# Patient Record
Sex: Female | Born: 1975 | Race: White | Hispanic: No | Marital: Married | Smoking: Former smoker
Health system: Southern US, Community
[De-identification: ages and names within clinical notes are randomized; demographics above are authoritative.]

## PROBLEM LIST (undated history)

## (undated) ENCOUNTER — Inpatient Hospital Stay (HOSPITAL_COMMUNITY): Payer: Self-pay

## (undated) DIAGNOSIS — D1803 Hemangioma of intra-abdominal structures: Secondary | ICD-10-CM

## (undated) DIAGNOSIS — Z8619 Personal history of other infectious and parasitic diseases: Secondary | ICD-10-CM

## (undated) DIAGNOSIS — E282 Polycystic ovarian syndrome: Secondary | ICD-10-CM

## (undated) DIAGNOSIS — N809 Endometriosis, unspecified: Secondary | ICD-10-CM

## (undated) DIAGNOSIS — T7840XA Allergy, unspecified, initial encounter: Secondary | ICD-10-CM

## (undated) DIAGNOSIS — R569 Unspecified convulsions: Secondary | ICD-10-CM

## (undated) DIAGNOSIS — Z8669 Personal history of other diseases of the nervous system and sense organs: Secondary | ICD-10-CM

## (undated) DIAGNOSIS — R000 Tachycardia, unspecified: Secondary | ICD-10-CM

## (undated) DIAGNOSIS — Z87442 Personal history of urinary calculi: Secondary | ICD-10-CM

## (undated) DIAGNOSIS — R112 Nausea with vomiting, unspecified: Secondary | ICD-10-CM

## (undated) DIAGNOSIS — E785 Hyperlipidemia, unspecified: Secondary | ICD-10-CM

## (undated) DIAGNOSIS — G43909 Migraine, unspecified, not intractable, without status migrainosus: Secondary | ICD-10-CM

## (undated) DIAGNOSIS — F329 Major depressive disorder, single episode, unspecified: Secondary | ICD-10-CM

## (undated) DIAGNOSIS — G4730 Sleep apnea, unspecified: Secondary | ICD-10-CM

## (undated) DIAGNOSIS — R632 Polyphagia: Secondary | ICD-10-CM

## (undated) DIAGNOSIS — Z9889 Other specified postprocedural states: Secondary | ICD-10-CM

## (undated) DIAGNOSIS — Z87440 Personal history of urinary (tract) infections: Secondary | ICD-10-CM

## (undated) DIAGNOSIS — H9325 Central auditory processing disorder: Secondary | ICD-10-CM

## (undated) DIAGNOSIS — I10 Essential (primary) hypertension: Secondary | ICD-10-CM

## (undated) DIAGNOSIS — I4711 Inappropriate sinus tachycardia, so stated: Secondary | ICD-10-CM

## (undated) DIAGNOSIS — F32A Depression, unspecified: Secondary | ICD-10-CM

## (undated) DIAGNOSIS — K219 Gastro-esophageal reflux disease without esophagitis: Secondary | ICD-10-CM

## (undated) HISTORY — DX: Essential (primary) hypertension: I10

## (undated) HISTORY — DX: Gastro-esophageal reflux disease without esophagitis: K21.9

## (undated) HISTORY — DX: Personal history of other infectious and parasitic diseases: Z86.19

## (undated) HISTORY — DX: Major depressive disorder, single episode, unspecified: F32.9

## (undated) HISTORY — DX: Personal history of urinary calculi: Z87.442

## (undated) HISTORY — DX: Tachycardia, unspecified: R00.0

## (undated) HISTORY — PX: WISDOM TOOTH EXTRACTION: SHX21

## (undated) HISTORY — DX: Allergy, unspecified, initial encounter: T78.40XA

## (undated) HISTORY — DX: Endometriosis, unspecified: N80.9

## (undated) HISTORY — PX: OTHER SURGICAL HISTORY: SHX169

## (undated) HISTORY — DX: Hyperlipidemia, unspecified: E78.5

## (undated) HISTORY — DX: Polyphagia: R63.2

## (undated) HISTORY — DX: Migraine, unspecified, not intractable, without status migrainosus: G43.909

## (undated) HISTORY — DX: Depression, unspecified: F32.A

## (undated) HISTORY — DX: Inappropriate sinus tachycardia, so stated: I47.11

## (undated) HISTORY — DX: Personal history of other diseases of the nervous system and sense organs: Z86.69

## (undated) HISTORY — DX: Personal history of urinary (tract) infections: Z87.440

---

## 2001-03-14 HISTORY — PX: PILONIDAL CYST EXCISION: SHX744

## 2004-03-14 HISTORY — PX: CHOLECYSTECTOMY: SHX55

## 2005-10-07 ENCOUNTER — Encounter: Admission: RE | Admit: 2005-10-07 | Discharge: 2005-10-07 | Payer: Self-pay | Admitting: Internal Medicine

## 2005-10-20 ENCOUNTER — Ambulatory Visit (HOSPITAL_COMMUNITY): Admission: RE | Admit: 2005-10-20 | Discharge: 2005-10-21 | Payer: Self-pay | Admitting: General Surgery

## 2005-10-21 ENCOUNTER — Encounter (INDEPENDENT_AMBULATORY_CARE_PROVIDER_SITE_OTHER): Payer: Self-pay | Admitting: Specialist

## 2007-03-01 ENCOUNTER — Ambulatory Visit (HOSPITAL_COMMUNITY): Admission: RE | Admit: 2007-03-01 | Discharge: 2007-03-01 | Payer: Self-pay | Admitting: Obstetrics & Gynecology

## 2007-10-11 ENCOUNTER — Encounter: Admission: RE | Admit: 2007-10-11 | Discharge: 2007-12-04 | Payer: Self-pay | Admitting: Obstetrics & Gynecology

## 2008-06-24 ENCOUNTER — Ambulatory Visit: Payer: Self-pay | Admitting: Obstetrics & Gynecology

## 2008-07-15 ENCOUNTER — Encounter: Admission: RE | Admit: 2008-07-15 | Discharge: 2008-09-03 | Payer: Self-pay | Admitting: Obstetrics & Gynecology

## 2008-10-26 ENCOUNTER — Emergency Department (HOSPITAL_COMMUNITY): Admission: EM | Admit: 2008-10-26 | Discharge: 2008-10-26 | Payer: Self-pay | Admitting: Emergency Medicine

## 2010-06-20 LAB — URINALYSIS, ROUTINE W REFLEX MICROSCOPIC
Bilirubin Urine: NEGATIVE
Glucose, UA: NEGATIVE mg/dL
Ketones, ur: NEGATIVE mg/dL
Nitrite: POSITIVE — AB
Protein, ur: NEGATIVE mg/dL

## 2010-06-20 LAB — URINE CULTURE: Colony Count: >=100000

## 2010-06-20 LAB — URINE MICROSCOPIC-ADD ON

## 2010-06-30 ENCOUNTER — Ambulatory Visit (HOSPITAL_BASED_OUTPATIENT_CLINIC_OR_DEPARTMENT_OTHER): Payer: Medicaid Other | Attending: Family Medicine

## 2010-06-30 DIAGNOSIS — G4710 Hypersomnia, unspecified: Secondary | ICD-10-CM | POA: Insufficient documentation

## 2010-06-30 DIAGNOSIS — G4730 Sleep apnea, unspecified: Secondary | ICD-10-CM | POA: Insufficient documentation

## 2010-06-30 DIAGNOSIS — I491 Atrial premature depolarization: Secondary | ICD-10-CM | POA: Insufficient documentation

## 2010-07-03 DIAGNOSIS — G4730 Sleep apnea, unspecified: Secondary | ICD-10-CM

## 2010-07-03 DIAGNOSIS — I491 Atrial premature depolarization: Secondary | ICD-10-CM

## 2010-07-03 DIAGNOSIS — G4710 Hypersomnia, unspecified: Secondary | ICD-10-CM

## 2010-07-03 NOTE — Procedures (Signed)
NAMEALDINE, CHAKRABORTY                ACCOUNT NO.:  [id number]  MEDICAL RECORD NO.:  [id number]         PATIENT TYPE:  OUT  LOCATION:  SLEEP CENTER                 FACILITY:  [facility]  PHYSICIAN:  Clinton D. Maple Hudson, MD, FCCP, FACPDATE OF BIRTH:  26-[birth year]-1976  DATE OF STUDY:  06/30/2010                           NOCTURNAL POLYSOMNOGRAM  REFERRING PHYSICIAN:  DANA I ZANONE  INDICATION FOR STUDY:  Hypersomnia with sleep apnea.  EPWORTH SLEEPINESS SCORE:  9/24.  BMI 37.1.  Weight 190 pounds.  Height 68 inches.  Neck 14.5 inches.  MEDICATIONS:  Home medications are charted and reviewed.  SLEEP ARCHITECTURE:  Total sleep time 255.5 minutes with sleep efficiency 70.9%.  Stage I was 5.9%, stage II 82.6%, stage III 11.5%, REM absent.  Sleep latency 66.5 minutes, REM latency NA.  Awake after sleep onset 20 minutes, arousal index 13.6.  BEDTIME MEDICATION:  None.  RESPIRATORY DATA:  Apnea-hypopnea index (AHI) 0 per hour.  No significant respiratory events were scored.  She did not qualify for application of CPAP titration by split protocol.  OXYGEN DATA:  Moderately loud snoring with oxygen desaturation to a nadir of 93% and the mean oxygen saturation through the study of 95.1% on room air.  CARDIAC DATA:  Sinus rhythm with occasional PAC.  MOVEMENT-PARASOMNIA:  No significant movement disturbance.  No bathroom trips.  IMPRESSIONS-RECOMMENDATIONS: 1. Sleep architecture was significant for delay in sleep onset until     shortly after midnight, waking then at 5 a.m. to end the study.  No     bedtime medication. 2. No significant respiratory events with sleep disturbance.  AHI 0     per hour.  Moderately loud snoring with oxygen desaturation to a     nadir of 93% and the mean oxygen saturation through the study of     95.1% on room     air. 3. Occasional premature atrial contractions.  On arrival, blood     pressure was 143/97.     Clinton D. Maple Hudson, MD, [facility], FACP Diplomate, Research scientist (medical) of Sleep Medicine Electronically Signed    CDY/MEDQ  D:  07/03/2010 11:18:29  T:  07/03/2010 22:16:29  Job:  [id number]

## 2010-07-05 ENCOUNTER — Encounter (HOSPITAL_BASED_OUTPATIENT_CLINIC_OR_DEPARTMENT_OTHER): Payer: Self-pay

## 2010-07-30 NOTE — Op Note (Signed)
NAMEDahlia Haley             ACCOUNT NO.:  [id number]   MEDICAL RECORD NO.:  [id number]          PATIENT TYPE:  AMB   LOCATION:  SDS                          FACILITY:  [facility]   PHYSICIAN:  Cherylynn Ridges, M.D.    DATE OF BIRTH:  [birth year]   DATE OF PROCEDURE:  10/21/2005  DATE OF DISCHARGE:                                 OPERATIVE REPORT   PREOPERATIVE DIAGNOSIS:  Symptomatic cholelithiasis.   POSTOPERATIVE DIAGNOSIS:  Symptomatic cholelithiasis.   PROCEDURE:  Laparoscopic cholecystectomy with cholangiogram.   SURGEON:  Dr. Lindie Spruce.   ASSISTANT:  Medical student, Dirk Dress.   ANESTHESIA:  General endotracheal.   ESTIMATED BLOOD LOSS:  Less than 20 mL.   COMPLICATIONS:  None.   CONDITION:  Stable   SPECIMEN:  Gallbladder plus stones.   INDICATIONS FOR OPERATION:  The patient is a 35 year old with abdominal pain  and right upper quadrant postprandially.  He now comes in for an elective  laparoscopic cholecystectomy.   FINDINGS:  In addition to gallstones, the patient had a normal cholangiogram  which did reflux into the pancreatic duct.  The patient also had significant  adhesions to the right lobe of the liver posteriorly which we lysed.   OPERATION:  The patient was taken to the operating room, placed on the table  in supine position.  After an adequate endotracheal anesthetic was  administered, she was prepped and draped in the usual sterile manner  exposing the midline in the right upper quadrant.   A supraumbilical curvilinear incision was made using and 11 blade and taken  down to the midline fascia.  It was through this midline fascia that we made  an incision using #15 blade longitudinally, grabbing the edges with a Kocher  clamp x2.  We then bluntly dissected down to the peritoneal cavity, then  passed a pursestring suture of 0 Vicryl, passed on the UR6 needle into the  fascial opening.  We then used Hassan cannula passed through the fascial  opening into the peritoneal cavity and secured in place with the pursestring  suture.  Carbon dioxide gas was then insufflated into the peritoneal cavity  up to a maximal intra-abdominal pressure of 12 mmHg.  The patient was placed  in reversed Trendelenburg, the left-side was tilted down.  Two right costal  margin 5-mm cannulas and a subxiphoid 11/12-mm cannula were passed under  direct vision into the peritoneal cavity.  Once this was done, the  dissection was begun.   The patient was noted to have adhesions of omentum to the right lobe of the  liver which were taken down using electrocautery.  Any tearing of the liver  capsule was controlled with electrocautery.  We then placed a clamp on the  gallbladder dome, retracted towards the anterior abdominal wall and right  upper quadrant.  A second clamp was placed along the infundibulum as we  fanned out the peritoneum overlying the triangle of Calot and hepatic  duodenal triangle.  With this in place, we able to dissect out the cystic  duct and the cystic artery.  Once we had the cystic  duct adequately  dissected out and a window was placed between it and the cystic artery, we  placed an Endoclip along the gallbladder side of the cystic duct, made a  cholecystodochotomy using laparoscopic scissors and then did a cholangiogram  using a Cook catheter which was passed through the anterior abdominal wall  and then passed into the cholecystodochotomy.  Cholangiogram showed a very  small common bile duct and common hepatic duct, good flow into the duodenum,  red reflex into the pancreatic duct and good proximal filling.  Once  cholangiogram was completed, we removed the clipped, removed the  cholangiocatheter, distally clipped the cystic duct x2 and then transected  it.  We then transected the proximally and distally clipped cystic artery  and then dissected out the gallbladder from its bed using electrocautery on  a hook dissector.  All counts  were correct at that point.  Once the  gallbladder was removed, we used an EndoCatch bag to bring it out from the  supraumbilical site.  We then closed off this site using a pursestring  suture which was then placed to hold in the Heath cannula.  We irrigated  with a little bit more than a liter of saline solution.  Once this was done,  we aspirated all fluid and gas from above the liver, removing all cannulas.  The skin was then closed using running subcuticular stitch of 4-0 Vicryl.  Quarter percent Marcaine with epinephrine was used to anesthetize the skin.  All needle, sponge counts and instrument counts were correct.  Sterile  dressing was applied.      Cherylynn Ridges, M.D.  Electronically Signed     JOW/MEDQ  D:  10/21/2005  T:  10/21/2005  Job:  [id number]   cc:   Lilla Shook, M.D.

## 2011-01-27 ENCOUNTER — Other Ambulatory Visit: Payer: Self-pay | Admitting: Sports Medicine

## 2011-01-27 DIAGNOSIS — M25512 Pain in left shoulder: Secondary | ICD-10-CM

## 2011-02-11 ENCOUNTER — Ambulatory Visit
Admission: RE | Admit: 2011-02-11 | Discharge: 2011-02-11 | Disposition: A | Discharge status: Home / Self Care | Payer: PRIVATE HEALTH INSURANCE | Source: Ambulatory Visit | Attending: Sports Medicine | Admitting: Sports Medicine

## 2011-02-11 DIAGNOSIS — M25512 Pain in left shoulder: Secondary | ICD-10-CM

## 2011-02-11 MED ORDER — IOHEXOL 180 MG/ML  SOLN
10.0000 mL | Freq: Once | INTRAMUSCULAR | Status: AC | PRN
  Administered 2011-02-11: 10 mL via INTRA_ARTICULAR

## 2011-07-13 LAB — HM PAP SMEAR: HM Pap smear: NORMAL

## 2011-09-20 ENCOUNTER — Encounter: Payer: Self-pay | Admitting: Family

## 2011-09-20 ENCOUNTER — Ambulatory Visit (INDEPENDENT_AMBULATORY_CARE_PROVIDER_SITE_OTHER): Payer: PRIVATE HEALTH INSURANCE | Admitting: Family

## 2011-09-20 VITALS — BP 118/86 | HR 67 | Temp 98.5°F | Resp 16 | Ht 60.0 in | Wt 184.0 lb

## 2011-09-20 DIAGNOSIS — F418 Other specified anxiety disorders: Secondary | ICD-10-CM | POA: Insufficient documentation

## 2011-09-20 DIAGNOSIS — F341 Dysthymic disorder: Secondary | ICD-10-CM

## 2011-09-20 DIAGNOSIS — G40909 Epilepsy, unspecified, not intractable, without status epilepticus: Secondary | ICD-10-CM

## 2011-09-20 DIAGNOSIS — Z720 Tobacco use: Secondary | ICD-10-CM

## 2011-09-20 DIAGNOSIS — E785 Hyperlipidemia, unspecified: Secondary | ICD-10-CM

## 2011-09-20 DIAGNOSIS — I4711 Inappropriate sinus tachycardia, so stated: Secondary | ICD-10-CM

## 2011-09-20 DIAGNOSIS — N390 Urinary tract infection, site not specified: Secondary | ICD-10-CM

## 2011-09-20 DIAGNOSIS — F4310 Post-traumatic stress disorder, unspecified: Secondary | ICD-10-CM | POA: Insufficient documentation

## 2011-09-20 DIAGNOSIS — E669 Obesity, unspecified: Secondary | ICD-10-CM

## 2011-09-20 DIAGNOSIS — F17200 Nicotine dependence, unspecified, uncomplicated: Secondary | ICD-10-CM

## 2011-09-20 DIAGNOSIS — I498 Other specified cardiac arrhythmias: Secondary | ICD-10-CM

## 2011-09-20 DIAGNOSIS — R000 Tachycardia, unspecified: Secondary | ICD-10-CM

## 2011-09-20 DIAGNOSIS — G43909 Migraine, unspecified, not intractable, without status migrainosus: Secondary | ICD-10-CM | POA: Insufficient documentation

## 2011-09-20 MED ORDER — VARENICLINE TARTRATE 0.5 MG X 11 & 1 MG X 42 PO MISC: ORAL | Status: DC

## 2011-09-20 MED ORDER — VARENICLINE TARTRATE 1 MG PO TABS: 1.0000 mg | ORAL_TABLET | Freq: Two times a day (BID) | ORAL | Status: DC

## 2011-09-20 NOTE — Assessment & Plan Note (Signed)
Plan to check FLP next visit.  

## 2011-09-20 NOTE — Assessment & Plan Note (Signed)
Recommended that she keep calories 1200 to 1500 a day.

## 2011-09-20 NOTE — Assessment & Plan Note (Addendum)
Pt motivated to quit.  Wants to try chantix.  Reviewed side effects of chantix including rare risk of SI.  Pt instructed to discontinue med and go to ED if this occurs. Counseled on smoking cessation x 3-5 minutes.
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
## 2011-09-20 NOTE — Assessment & Plan Note (Signed)
Controlled off of meds.  Follows with neurology.

## 2011-09-20 NOTE — Assessment & Plan Note (Signed)
Currently stable off of meds- managed by neurology.

## 2011-09-20 NOTE — Progress Notes (Signed)
Subjective:    Patient ID: Chelsea Haley, female    DOB: 01-17-1976, 36 y.o.   MRN: [id number]  HPI  [name] "Chelsea Haley" is a 36 yr old female who presents today to establish care. She has several concerns:  Overweight- She reports that she really wants to lose weight.   Tobacco abuse- Wants to quit.    Tachycardia- Diagnosed in 2008- thinks that she needs to see an EP/different cardiologist.  Reports that it sometimes feels like heart is trying to stop/and then start back up.  Reports that this is intermittent (IST).  Reports that it happens when she tries to physically exert herself.  She reports that she had a 30 day monitor.  Has seen Dr. Chales Abrahams in [place]. On metoprolol.  Depression- well controlled on prozac. She has never been hospitalized for depression. PTSD/Depression/Anxiety. She was in group therapy. Sees Dr.  Mady Haagensen at [facility] in [place]  HTN- HCTZ.  She reports that she develops edema in legs and fingers if she does not take hctz.    Seizure disorder- diagnosed (infantile spasms) age 7months.  At age 68 developed febrile seizures. She sees Environmental education officer at [facility] [employer].  She reports hx or recent abnormal EEG.  Hyperlipidemia- not on meds, trying to watch her diet.    Migraines- last migraine was in April.  She develops "spots" only. She does not take medication for this.  Kidney stones- had one episode 1 year ago.  Had stone in each kidney. Sees Dr. Logan Bores- urology, who has frequend uti's and takes macrodantin prophylaxis.       Review of Systems See HPI  Past Medical History  Diagnosis Date  . History of chicken pox   . Depression   . Migraines   . History of kidney stones   . History of seizure disorder     as a child. No medication since age 60.  Marland Kitchen Hyperlipidemia   . Elevated blood pressure reading without diagnosis of hypertension   . Tachycardia   . History of frequent urinary tract infections   . GERD (gastroesophageal reflux disease)   . Binge eating      History   Social History  . Marital Status: Divorced    Spouse Name: N/A    Number of Children: 1  . Years of Education: N/A   Occupational History  .     Social History Main Topics  . Smoking status: Current Everyday Smoker -- 1.0 packs/day for 20 years    Types: Cigarettes  . Smokeless tobacco: Never Used  . Alcohol Use: 0.5 oz/week    1 drink(s) per week  . Drug Use: Not on file  . Sexually Active: Not on file   Other Topics Concern  . Not on file   Social History Narrative   Regular exercise:  NoCaffeine use:  No    Past Surgical History  Procedure Date  . Cholecystectomy 2006  . Cesarean section 2002  . Pilonidal cyst excision 2003    Family History  Problem Relation Age of Onset  . Arthritis Father   . Hypertension Father   . Heart disease Maternal Grandmother   . Cancer Maternal Grandfather     lung  . Diabetes Neg Hx     Allergies  Allergen Reactions  . Adhesive (Tape)     Skin sensitivity  . Banana     migraine  . Morphine And Related Itching    hallucinations  . Other Itching    WALNUTS.  Gums  itch.  . Pertussis Vaccines     seizures    Current Outpatient Prescriptions on File Prior to Visit  Medication Sig Dispense Refill  . FLUoxetine (PROZAC) 20 MG tablet Take 20 mg by mouth daily.      . hydrochlorothiazide (HYDRODIURIL) 25 MG tablet Take 25 mg by mouth daily.      . metoprolol succinate (TOPROL-XL) 25 MG 24 hr tablet Take 25 mg by mouth daily.      . Norethindrone Acet-Ethinyl Est (MICROGESTIN 1.5/30 PO) Take 1 tablet by mouth daily.        BP 118/86  Pulse 67  Temp 98.5 F (36.9 C) (Oral)  Resp 16  Ht 5' (1.524 m)  Wt 184 lb (83.462 kg)  BMI 35.94 kg/m2  SpO2 99%  LMP 07/19/2011       Objective:   Physical Exam  Constitutional: She appears well-developed and well-nourished. No distress.  Eyes: No scleral icterus.  Cardiovascular: Normal rate and regular rhythm.   No murmur heard. Pulmonary/Chest: Effort normal  and breath sounds normal. No respiratory distress. She has no wheezes. She has no rales. She exhibits no tenderness.  Musculoskeletal: She exhibits no edema.  Lymphadenopathy:    She has no cervical adenopathy.  Skin: Skin is warm and dry. No rash noted. No erythema. No pallor.  Psychiatric: She has a normal mood and affect. Her behavior is normal. Judgment and thought content normal.          Assessment & Plan:  30 minutes spent with pt.  >50 percent of this time was spent counseling pt on weight loss and smoking cessation.

## 2011-09-20 NOTE — Assessment & Plan Note (Signed)
She continues macrodantin prophylaxis per Dr. Logan Bores. Stable.

## 2011-09-20 NOTE — Assessment & Plan Note (Signed)
She continues to have intermittent symptoms every few months.  Would like to establish with a new cardiologist.  Will refer to Dr. Jens Som.

## 2011-09-20 NOTE — Assessment & Plan Note (Signed)
Stable on prozac.  Follows with Dr. Mady Haagensen at [facility] in [place].

## 2011-09-20 NOTE — Patient Instructions (Addendum)
Stop smoking 1 week after you start chantix. Follow up in 1 month for a fasting physical.  Welcome to [facility]!

## 2011-09-27 ENCOUNTER — Telehealth: Payer: Self-pay | Admitting: Family

## 2011-09-27 NOTE — Telephone Encounter (Signed)
Received medical records from [facility] at [facility]  P: [phone] F: [phone]

## 2011-10-05 ENCOUNTER — Ambulatory Visit (INDEPENDENT_AMBULATORY_CARE_PROVIDER_SITE_OTHER): Payer: PRIVATE HEALTH INSURANCE | Admitting: Cardiology

## 2011-10-05 ENCOUNTER — Encounter: Payer: Self-pay | Admitting: Cardiology

## 2011-10-05 VITALS — BP 120/70 | HR 70 | Ht 60.0 in | Wt 184.0 lb

## 2011-10-05 DIAGNOSIS — R079 Chest pain, unspecified: Secondary | ICD-10-CM

## 2011-10-05 DIAGNOSIS — R0600 Dyspnea, unspecified: Secondary | ICD-10-CM

## 2011-10-05 DIAGNOSIS — R0989 Other specified symptoms and signs involving the circulatory and respiratory systems: Secondary | ICD-10-CM

## 2011-10-05 DIAGNOSIS — I498 Other specified cardiac arrhythmias: Secondary | ICD-10-CM

## 2011-10-05 DIAGNOSIS — Z720 Tobacco use: Secondary | ICD-10-CM

## 2011-10-05 DIAGNOSIS — F17200 Nicotine dependence, unspecified, uncomplicated: Secondary | ICD-10-CM

## 2011-10-05 DIAGNOSIS — R000 Tachycardia, unspecified: Secondary | ICD-10-CM

## 2011-10-05 NOTE — Progress Notes (Signed)
HPI: 36 year old female for evaluation of palpitations and chest pain. Patient has a history of inappropriate sinus tachycardia which is reasonably well controlled with Toprol. She does have dyspnea on exertion but no orthopnea, PND. Occasional mild pedal edema. She does have palpitations typically described as a pounding sensation with exercise or stress. When she has these symptoms she feels pain with the palpitations. She otherwise denies exertional chest pain. Because of the above we were asked to evaluate.  Current Outpatient Prescriptions  Medication Sig Dispense Refill  . FLUoxetine (PROZAC) 20 MG tablet Take 20 mg by mouth daily.      . hydrochlorothiazide (HYDRODIURIL) 25 MG tablet Take 25 mg by mouth daily.      . metoprolol succinate (TOPROL-XL) 25 MG 24 hr tablet Take 25 mg by mouth daily.      . Multiple Vitamin (MULTIVITAMIN) capsule Take 1 capsule by mouth daily.      . nitrofurantoin (MACRODANTIN) 50 MG capsule Take 50 mg by mouth daily.      . Norethindrone Acet-Ethinyl Est (MICROGESTIN 1.5/30 PO) Take 1 tablet by mouth daily.      . varenicline (CHANTIX CONTINUING MONTH PAK) 1 MG tablet Take 1 tablet (1 mg total) by mouth 2 (two) times daily.  60 tablet  1    Allergies  Allergen Reactions  . Adhesive (Tape)     Skin sensitivity  . Banana     migraine  . Morphine And Related Itching    hallucinations  . Other Itching    WALNUTS.  Gums itch.  . Pertussis Vaccines     seizures    Past Medical History  Diagnosis Date  . History of chicken pox   . Depression   . Migraines   . History of kidney stones   . History of seizure disorder     as a child. No medication since age 36.  Marland Kitchen Hyperlipidemia   . Hypertension   . Tachycardia   . History of frequent urinary tract infections   . GERD (gastroesophageal reflux disease)   . Binge eating   . Endometriosis     Past Surgical History  Procedure Date  . Cholecystectomy 2006  . Cesarean section 2002  . Pilonidal  cyst excision 2003  . Laporoscopy     History   Social History  . Marital Status: Divorced    Spouse Name: N/A    Number of Children: 1  . Years of Education: N/A   Occupational History  .      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 Smoking status: Current Everyday Smoker -- 1.0 packs/day for 20 years    Types: Cigarettes  . Smokeless tobacco: Never Used  . Alcohol Use: 0.5 oz/week    1 drink(s) per week  . Drug Use: Not on file  . Sexually Active: Not on file   Other Topics Concern  . Not on file   Social History Narrative   Regular exercise:  NoCaffeine use:  NoLives with sonWorks at [facility]- developmental English/readingMasters in Education    Family History  Problem Relation Age of Onset  . Arthritis Father   . Hypertension Father   . Heart disease Maternal Grandmother     ICD  . Cancer Maternal Grandfather     lung  . Diabetes Neg Hx     ROS: history of hematuria but no fevers or chills, productive cough, hemoptysis, dysphasia, odynophagia, melena, hematochezia, dysuria, rash, seizure activity, orthopnea, PND, claudication.  Remaining systems are negative.  Physical Exam:   Blood pressure 120/70, pulse 70, height 5' (1.524 m), weight 83.462 kg (184 lb), last menstrual period 07/19/2011.  General:  Well developed/well nourished in NAD Skin warm/dry Patient not depressed No peripheral clubbing Back-normal HEENT-normal/normal eyelids Neck supple/normal carotid upstroke bilaterally; no bruits; no JVD; no thyromegaly chest - CTA/ normal expansion CV - RRR/normal S1 and S2; no murmurs, rubs or gallops;  PMI nondisplaced Abdomen -NT/ND, no HSM, no mass, + bowel sounds, no bruit 2+ femoral pulses, no bruits Ext-no edema, chords, 2+ DP Neuro-grossly nonfocal  ECG normal sinus rhythm at a rate of 70. No ST changes.

## 2011-10-05 NOTE — Assessment & Plan Note (Signed)
Symptoms atypical. Exercise treadmill for risk stratification. 

## 2011-10-05 NOTE — Assessment & Plan Note (Signed)
Patient counseled on discontinuing. 

## 2011-10-05 NOTE — Assessment & Plan Note (Signed)
Echocardiogram to assess LV function. 

## 2011-10-05 NOTE — Patient Instructions (Addendum)
Your physician wants you to follow-up in: 6 MONTHS WITH DR Jens Som IN [place] You will receive a reminder letter in the mail two months in advance. If you don't receive a letter, please call our office to schedule the follow-up appointment.   Your physician has requested that you have an exercise tolerance test. For further information please visit [email or web address] Please also follow instruction sheet, as given.   Your physician has requested that you have an echocardiogram. Echocardiography is a painless test that uses sound waves to create images of your heart. It provides your doctor with information about the size and shape of your heart and how well your heart's chambers and valves are working. This procedure takes approximately one hour. There are no restrictions for this procedure.

## 2011-10-05 NOTE — Assessment & Plan Note (Signed)
Continue beta blocker. She is complaining of palpitations with exertion and is planning to begin an exercise program. Schedule exercise treadmill to exclude exercise-induced arrhythmia.

## 2011-10-11 ENCOUNTER — Ambulatory Visit (HOSPITAL_COMMUNITY): Payer: PRIVATE HEALTH INSURANCE | Attending: Cardiology | Admitting: Radiology

## 2011-10-11 DIAGNOSIS — E785 Hyperlipidemia, unspecified: Secondary | ICD-10-CM | POA: Insufficient documentation

## 2011-10-11 DIAGNOSIS — I10 Essential (primary) hypertension: Secondary | ICD-10-CM | POA: Insufficient documentation

## 2011-10-11 DIAGNOSIS — I079 Rheumatic tricuspid valve disease, unspecified: Secondary | ICD-10-CM | POA: Insufficient documentation

## 2011-10-11 DIAGNOSIS — E669 Obesity, unspecified: Secondary | ICD-10-CM | POA: Insufficient documentation

## 2011-10-11 DIAGNOSIS — F17200 Nicotine dependence, unspecified, uncomplicated: Secondary | ICD-10-CM | POA: Insufficient documentation

## 2011-10-11 DIAGNOSIS — R0600 Dyspnea, unspecified: Secondary | ICD-10-CM

## 2011-10-11 DIAGNOSIS — R0989 Other specified symptoms and signs involving the circulatory and respiratory systems: Secondary | ICD-10-CM | POA: Insufficient documentation

## 2011-10-11 DIAGNOSIS — R079 Chest pain, unspecified: Secondary | ICD-10-CM

## 2011-10-11 DIAGNOSIS — R0609 Other forms of dyspnea: Secondary | ICD-10-CM | POA: Insufficient documentation

## 2011-10-11 DIAGNOSIS — R072 Precordial pain: Secondary | ICD-10-CM | POA: Insufficient documentation

## 2011-10-11 NOTE — Progress Notes (Signed)
Echocardiogram performed.  

## 2011-10-13 ENCOUNTER — Telehealth: Payer: Self-pay | Admitting: Cardiology

## 2011-10-13 NOTE — Telephone Encounter (Signed)
Spoke with pt, aware of echo results. 

## 2011-10-13 NOTE — Telephone Encounter (Signed)
Pt rtn your call to get results of echo

## 2011-10-20 ENCOUNTER — Telehealth: Payer: Self-pay | Admitting: *Deleted

## 2011-10-20 NOTE — Telephone Encounter (Signed)
Pt left voice message that her insurance is changing and she is not due to get her card for another week. She cannot afford to pay for Chantix out of pocket until her card is received. Pt is wanting to know what she should do? Please advise.

## 2011-10-21 ENCOUNTER — Encounter: Payer: Self-pay | Admitting: Nurse Practitioner

## 2011-10-21 ENCOUNTER — Ambulatory Visit (INDEPENDENT_AMBULATORY_CARE_PROVIDER_SITE_OTHER): Payer: Self-pay | Admitting: Nurse Practitioner

## 2011-10-21 DIAGNOSIS — R0600 Dyspnea, unspecified: Secondary | ICD-10-CM

## 2011-10-21 DIAGNOSIS — R079 Chest pain, unspecified: Secondary | ICD-10-CM

## 2011-10-21 NOTE — Telephone Encounter (Signed)
She can try the nicotine patch or gum after she runs out until she can restart chantix.  Then she should stop the patch/gum.

## 2011-10-21 NOTE — Procedures (Signed)
Exercise Treadmill Test  Pre-Exercise Testing Evaluation Rhythm: sinus bradycardia  Rate: 59   PR:  .17 QRS:  .08  QT:  .42 QTc: .41     Test  Exercise Tolerance Test Ordering MD: Olga Millers, MD  Interpreting MD: Norma Fredrickson , NP  Unique Test No: 1  Treadmill:  1  Indication for ETT: chest pain - rule out ischemia  Contraindication to ETT: No   Stress Modality: exercise - treadmill  Cardiac Imaging Performed: non   Protocol: standard Bruce - maximal  Max BP: 141/63  Max MPHR (bpm):  185 85% MPR (bpm):  157  MPHR obtained (bpm):  141 % MPHR obtained:  76%  Reached 85% MPHR (min:sec):  n/a Total Exercise Time (min-sec):  9:00  Workload in METS:  9.9 Borg Scale: 16  Reason ETT Terminated:  patient's desire to stop    ST Segment Analysis At Rest: normal ST segments - no evidence of significant ST depression With Exercise: no evidence of significant ST depression  Other Information Arrhythmia:  No Angina during ETT:  absent (0) Quality of ETT:  non-diagnostic  ETT Interpretation:  Non diagnostic. Target heart rate not achieved.   Comments: Patient presents today for routine GXT. Has had some atypical chest pain. Has a history of tachycardia that has improved with beta blocker therapy. Has had normal echo. Has stopped smoking. Now walking daily. She has taken her medicines today including her beta blocker as directed by Dr. Jens Som.   She exercised on the standard Bruce protocol today for 9 minutes. Good exercise tolerance. Adequate blood pressure response. The test was stopped due to fatigue. No complaints of chest pain. Mildly short of breath. EKG was negative for ischemia. Target heart rate was not achieved due to beta blocker effect. No arrhythmia.   Recommendations: She is given the ok to continue with her exercise program with a goal of 45 to 60 minutes per day. She is congratulated about her smoking cessation. Echo results were reviewed today as well. Will see her back  in January as planned. Patient is agreeable to this plan and will call if any problems develop in the interim.

## 2011-10-21 NOTE — Telephone Encounter (Signed)
Notified pt. 

## 2011-10-25 ENCOUNTER — Ambulatory Visit (INDEPENDENT_AMBULATORY_CARE_PROVIDER_SITE_OTHER): Payer: Self-pay | Admitting: Family

## 2011-10-25 ENCOUNTER — Encounter: Payer: Self-pay | Admitting: Family

## 2011-10-25 VITALS — BP 122/80 | HR 67 | Temp 97.9°F | Resp 16 | Ht 60.0 in | Wt 181.1 lb

## 2011-10-25 DIAGNOSIS — Z0000 Encounter for general adult medical examination without abnormal findings: Secondary | ICD-10-CM

## 2011-10-25 DIAGNOSIS — Z23 Encounter for immunization: Secondary | ICD-10-CM

## 2011-10-25 LAB — HEPATIC FUNCTION PANEL
ALT: 33 U/L (ref 0–35)
AST: 21 U/L (ref 0–37)
Albumin: 4.1 g/dL (ref 3.5–5.2)
Alkaline Phosphatase: 24 U/L — ABNORMAL LOW (ref 39–117)
Indirect Bilirubin: 0.3 mg/dL (ref 0.0–0.9)
Total Bilirubin: 0.4 mg/dL (ref 0.3–1.2)
Total Protein: 6.9 g/dL (ref 6.0–8.3)

## 2011-10-25 LAB — CBC WITH DIFFERENTIAL/PLATELET
Basophils Relative: 1 % (ref 0–1)
Eosinophils Absolute: 0.2 10*3/uL (ref 0.0–0.7)
HCT: 37.9 % (ref 36.0–46.0)
Lymphs Abs: 4.7 10*3/uL — ABNORMAL HIGH (ref 0.7–4.0)
MCHC: 34.6 g/dL (ref 30.0–36.0)
MCV: 84.8 fL (ref 78.0–100.0)
WBC: 11 10*3/uL — ABNORMAL HIGH (ref 4.0–10.5)

## 2011-10-25 LAB — TSH: TSH: 1.438 u[IU]/mL (ref 0.350–4.500)

## 2011-10-25 LAB — BASIC METABOLIC PANEL WITH GFR
CO2: 25 mEq/L (ref 19–32)
Chloride: 99 mEq/L (ref 96–112)
Creat: 0.78 mg/dL (ref 0.50–1.10)
Glucose, Bld: 81 mg/dL (ref 70–99)
Sodium: 135 mEq/L (ref 135–145)

## 2011-10-25 LAB — URINALYSIS, ROUTINE W REFLEX MICROSCOPIC
Glucose, UA: NEGATIVE mg/dL
Leukocytes, UA: NEGATIVE
Protein, ur: NEGATIVE mg/dL
Specific Gravity, Urine: 1.023 (ref 1.005–1.030)
pH: 6.5 (ref 5.0–8.0)

## 2011-10-25 LAB — LIPID PANEL
HDL: 42 mg/dL (ref >39)
LDL Cholesterol: 125 mg/dL — ABNORMAL HIGH (ref 0–99)

## 2011-10-25 NOTE — Assessment & Plan Note (Signed)
Pt is encouraged to continue her hard work with diet, exercise and quitting smoking. Tetanus booster today.  Obtain fasting lab work.

## 2011-10-25 NOTE — Progress Notes (Signed)
Subjective:    Patient ID: Chelsea Haley, female    DOB: 1975/08/11, 36 y.o.   MRN: [id number]  HPI  [name] is a 36 yr old female who presents today for her annual CPX.  She has lost 3 pounds since her last visit. She has been exercising every day and counting her calories using [employer].  She is due for a tetanus booster today.   Last pap was May of this year. She quit smoking 2 weeks ago.  Took chantix x 1 month, then could not afford it.     Review of Systems  Constitutional: Negative for unexpected weight change.  HENT: Negative for hearing loss.   Eyes: Negative for visual disturbance.  Respiratory: Negative for chest tightness and shortness of breath.   Cardiovascular: Negative for chest pain.  Gastrointestinal: Negative for nausea, vomiting, diarrhea and constipation.  Genitourinary: Negative for dysuria and frequency.  Musculoskeletal: Negative for myalgias and arthralgias.  Skin: Negative for rash.  Neurological: Negative for headaches.  Hematological: Does not bruise/bleed easily.  Psychiatric/Behavioral:       Reports depression/anxiety stable.       Past Medical History  Diagnosis Date  . History of chicken pox   . Depression   . Migraines   . History of kidney stones   . History of seizure disorder     as a child. No medication since age 80.  Marland Kitchen Hyperlipidemia   . Hypertension   . Tachycardia   . History of frequent urinary tract infections   . GERD (gastroesophageal reflux disease)   . Binge eating   . Endometriosis     History   Social History  . Marital Status: Divorced    Spouse Name: N/A    Number of Children: 1  . Years of Education: N/A   Occupational History  .      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 Smoking status: Current Some Day Smoker -- 1.0 packs/day for 20 years    Types: Cigarettes  . Smokeless tobacco: Never Used   Comment: Has cut down to 1 cigarette every 2 weeks.  . Alcohol Use: 0.5 oz/week    1  drink(s) per week  . Drug Use: Not on file  . Sexually Active: Not on file   Other Topics Concern  . Not on file   Social History Narrative   Regular exercise:  NoCaffeine use:  NoLives with sonWorks at [facility] [employer]- developmental English/readingMasters in Education    Past Surgical History  Procedure Date  . Cholecystectomy 2006  . Cesarean section 2002  . Pilonidal cyst excision 2003  . Laporoscopy     Family History  Problem Relation Age of Onset  . Arthritis Father   . Hypertension Father   . Heart disease Maternal Grandmother     ICD  . Cancer Maternal Grandfather     lung  . Diabetes Neg Hx     Allergies  Allergen Reactions  . Adhesive (Tape)     Skin sensitivity  . Banana     migraine  . Morphine And Related Itching    hallucinations  . Other Itching    WALNUTS.  Gums itch.  . Pertussis Vaccines     seizures    Current Outpatient Prescriptions on File Prior to Visit  Medication Sig Dispense Refill  . Calcium Carbonate-Vitamin D (CALTRATE 600+D) 600-400 MG-UNIT per tablet Take 1 tablet by mouth 2 (two) times daily.      Marland Kitchen  FLUoxetine (PROZAC) 20 MG tablet Take 20 mg by mouth daily.      . hydrochlorothiazide (HYDRODIURIL) 25 MG tablet Take 25 mg by mouth daily.      . metoprolol succinate (TOPROL-XL) 25 MG 24 hr tablet Take 25 mg by mouth daily.      . Multiple Vitamin (MULTIVITAMIN) capsule Take 1 capsule by mouth daily.      . nitrofurantoin (MACRODANTIN) 50 MG capsule Take 50 mg by mouth daily.      . Norethindrone Acet-Ethinyl Est (MICROGESTIN 1.5/30 PO) Take 1 tablet by mouth daily.      . varenicline (CHANTIX CONTINUING MONTH PAK) 1 MG tablet Take 1 tablet (1 mg total) by mouth 2 (two) times daily.  60 tablet  1    BP 122/80  Pulse 67  Temp 97.9 F (36.6 C) (Oral)  Resp 16  Ht 5' (1.524 m)  Wt 181 lb 1.3 oz (82.137 kg)  BMI 35.36 kg/m2  SpO2 99%  LMP 07/13/2011    Objective:   Physical Exam  Physical Exam  Constitutional: She is  oriented to person, place, and time. She appears well-developed and well-nourished. No distress.  HENT:  Head: Normocephalic and atraumatic.  Right Ear: Tympanic membrane and ear canal normal.  Left Ear: Tympanic membrane and ear canal normal.  Mouth/Throat: Oropharynx is clear and moist.  Eyes: Pupils are equal, round, and reactive to light. No scleral icterus.  Neck: Normal range of motion. No thyromegaly present.  Cardiovascular: Normal rate and regular rhythm.   No murmur heard. Pulmonary/Chest: Effort normal and breath sounds normal. No respiratory distress. He has no wheezes. She has no rales. She exhibits no tenderness.  Abdominal: Soft. Bowel sounds are normal. He exhibits no distension and no mass. There is no tenderness. There is no rebound and no guarding.  Musculoskeletal: She exhibits no edema.  Lymphadenopathy:    She has no cervical adenopathy.  Neurological: She is alert and oriented to person, place, and time. She has normal patellar reflexes. She exhibits normal muscle tone. Coordination normal.  Skin: Skin is warm and dry.  Psychiatric: She has a normal mood and affect. Her behavior is normal. Judgment and thought content normal.  Breast/pelvic- deferred to GYN         Assessment & Plan:         Assessment & Plan:

## 2011-10-25 NOTE — Patient Instructions (Addendum)
Please complete lab work prior to leaving. Keep up the good work with quitting smoking, diet and exercise. Follow up in 6 months.

## 2011-10-26 ENCOUNTER — Encounter: Payer: Self-pay | Admitting: Family

## 2011-10-26 ENCOUNTER — Telehealth: Payer: Self-pay | Admitting: Family

## 2011-10-26 DIAGNOSIS — E876 Hypokalemia: Secondary | ICD-10-CM

## 2011-10-26 MED ORDER — POTASSIUM CHLORIDE CRYS ER 20 MEQ PO TBCR: 20.0000 meq | EXTENDED_RELEASE_TABLET | Freq: Every day | ORAL | Status: DC

## 2011-10-26 NOTE — Telephone Encounter (Signed)
Pls call pt and let her know potassium is a little low, likely due to HCTZ.  I would like her to add a potassium supplement and repeat bmet in 1 week (hypokalemia). Kidney function, sugar look good.  WBC slightly elevated.  Could be fighting a cold?  Call if fever or cold symptoms.  Total cholesterol good.  Triglycerides slightly elevated- this should improve with her diet, exercise and weight loss.

## 2011-10-26 NOTE — Telephone Encounter (Signed)
Notified pt of results below. Future lab order entered and copy given to the lab.

## 2011-10-27 ENCOUNTER — Telehealth: Payer: Self-pay | Admitting: *Deleted

## 2011-10-27 NOTE — Telephone Encounter (Signed)
Received call from pt reporting increased swelling, bruising and soreness at tetanus injection site. Feels bad in general, denies nausea but doesn't feel like eating. Advised pt to try ibuprofen or tylenol if not previously contraindicated, apply ice pack to injection site and follow up with [place] tomorrow. Pt is unsure whether her general ill feeling is from the injection or recent elevated WBC.  Appt scheduled for 10/28/11 at 2:30pm.

## 2011-10-27 NOTE — Telephone Encounter (Signed)
Noted  

## 2011-10-28 ENCOUNTER — Encounter: Payer: Self-pay | Admitting: Family

## 2011-10-28 ENCOUNTER — Ambulatory Visit (INDEPENDENT_AMBULATORY_CARE_PROVIDER_SITE_OTHER): Payer: BC Managed Care – PPO | Admitting: Family

## 2011-10-28 VITALS — BP 118/80 | HR 75 | Temp 98.2°F | Resp 16 | Wt 182.1 lb

## 2011-10-28 DIAGNOSIS — L0291 Cutaneous abscess, unspecified: Secondary | ICD-10-CM

## 2011-10-28 DIAGNOSIS — L0390 Cellulitis, unspecified: Secondary | ICD-10-CM

## 2011-10-28 MED ORDER — CEPHALEXIN 500 MG PO CAPS: 500.0000 mg | ORAL_CAPSULE | Freq: Four times a day (QID) | ORAL | Status: AC

## 2011-10-28 NOTE — Assessment & Plan Note (Signed)
Suspect early cellulitis.  Will rx with ceftin.  She is instructed to watch the area closely and call us if increased pain/swelling/redness. Pt verbalizes understanding.

## 2011-10-28 NOTE — Progress Notes (Signed)
Subjective:    Patient ID: Chelsea Haley, female    DOB: 1975-09-13, 36 y.o.   MRN: [id number]  HPI  Chelsea Haley is a 36 yr old female who presents today due to local reaction to her tetanus booster given on 8/13 in the right arm.  She reports that she initially noted some mild redness. The area has become painful, swollen and red.    Just started potassium supplement. Notes that it is a little difficult to swallow, but manageable.    Right ear was bothering her this morning.   Review of Systems See HPI  Past Medical History  Diagnosis Date  . History of chicken pox   . Depression   . Migraines   . History of kidney stones   . History of seizure disorder     as a child. No medication since age 5.  Marland Kitchen Hyperlipidemia   . Hypertension   . Tachycardia   . History of frequent urinary tract infections   . GERD (gastroesophageal reflux disease)   . Binge eating   . Endometriosis     History   Social History  . Marital Status: Divorced    Spouse Name: N/A    Number of Children: 1  . Years of Education: N/A   Occupational History  .      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 Smoking status: Current Some Day Smoker -- 1.0 packs/day for 20 years    Types: Cigarettes  . Smokeless tobacco: Never Used   Comment: Has cut down to 1 cigarette every 2 weeks.  . Alcohol Use: 0.5 oz/week    1 drink(s) per week  . Drug Use: Not on file  . Sexually Active: Not on file   Other Topics Concern  . Not on file   Social History Narrative   Regular exercise:  NoCaffeine use:  NoLives with sonWorks at [facility]- developmental English/readingMasters in Education    Past Surgical History  Procedure Date  . Cholecystectomy 2006  . Cesarean section 2002  . Pilonidal cyst excision 2003  . Laporoscopy     Family History  Problem Relation Age of Onset  . Arthritis Father   . Hypertension Father   . Heart disease Maternal Grandmother     ICD  . Cancer Maternal  Grandfather     lung  . Diabetes Neg Hx     Allergies  Allergen Reactions  . Adhesive (Tape)     Skin sensitivity  . Banana     migraine  . Morphine And Related Itching    hallucinations  . Other Itching    WALNUTS.  Gums itch.  . Pertussis Vaccines     seizures    Current Outpatient Prescriptions on File Prior to Visit  Medication Sig Dispense Refill  . Calcium Carbonate-Vitamin D (CALTRATE 600+D) 600-400 MG-UNIT per tablet Take 1 tablet by mouth 2 (two) times daily.      Marland Kitchen FLUoxetine (PROZAC) 20 MG tablet Take 20 mg by mouth daily.      . hydrochlorothiazide (HYDRODIURIL) 25 MG tablet Take 25 mg by mouth daily.      . metoprolol succinate (TOPROL-XL) 25 MG 24 hr tablet Take 25 mg by mouth daily.      . Multiple Vitamin (MULTIVITAMIN) capsule Take 1 capsule by mouth daily.      . nitrofurantoin (MACRODANTIN) 50 MG capsule Take 50 mg by mouth daily.      . Norethindrone Acet-Ethinyl  Est (MICROGESTIN 1.5/30 PO) Take 1 tablet by mouth daily.      . potassium chloride SA (K-DUR,KLOR-CON) 20 MEQ tablet Take 1 tablet (20 mEq total) by mouth daily.  30 tablet  0    BP 118/80  Pulse 75  Temp 98.2 F (36.8 C) (Oral)  Resp 16  Wt 182 lb 1.3 oz (82.591 kg)  SpO2 99%  LMP 07/13/2011       Objective:   Physical Exam  Constitutional: She is oriented to person, place, and time. She appears well-developed and well-nourished. No distress.  HENT:  Head: Normocephalic and atraumatic.  Right Ear: Tympanic membrane and ear canal normal.  Left Ear: Tympanic membrane and ear canal normal.  Cardiovascular: Normal rate and regular rhythm.   No murmur heard. Pulmonary/Chest: Effort normal and breath sounds normal. No respiratory distress. She has no wheezes. She has no rales. She exhibits no tenderness.  Musculoskeletal: She exhibits no edema.  Lymphadenopathy:    She has no cervical adenopathy.  Neurological: She is alert and oriented to person, place, and time.  Skin:       R arm  has erythema approximately 2 inch in diameter, slight warmth/swelling.  + tenderness to touch.  Psychiatric: She has a normal mood and affect. Her behavior is normal. Judgment and thought content normal.          Assessment & Plan:

## 2011-11-04 ENCOUNTER — Encounter: Payer: Self-pay | Admitting: Family

## 2011-11-04 ENCOUNTER — Ambulatory Visit (INDEPENDENT_AMBULATORY_CARE_PROVIDER_SITE_OTHER): Payer: BC Managed Care – PPO | Admitting: Family

## 2011-11-04 VITALS — BP 112/80 | HR 66 | Temp 97.8°F | Ht 62.0 in | Wt 180.0 lb

## 2011-11-04 DIAGNOSIS — F17200 Nicotine dependence, unspecified, uncomplicated: Secondary | ICD-10-CM

## 2011-11-04 DIAGNOSIS — E669 Obesity, unspecified: Secondary | ICD-10-CM

## 2011-11-04 DIAGNOSIS — Z720 Tobacco use: Secondary | ICD-10-CM

## 2011-11-04 DIAGNOSIS — L0390 Cellulitis, unspecified: Secondary | ICD-10-CM

## 2011-11-04 DIAGNOSIS — E876 Hypokalemia: Secondary | ICD-10-CM

## 2011-11-04 MED ORDER — SILVER SULFADIAZINE 1 % EX CREA: TOPICAL_CREAM | Freq: Every day | CUTANEOUS | Status: DC

## 2011-11-04 NOTE — Progress Notes (Signed)
  Subjective:    Patient ID: Chelsea Haley, female    DOB: 27-Feb-1976, 36 y.o.   MRN: [id number]  HPI  Ms. Chelsea Haley is a 36 yr old female who presents today for follow up of her cellulitis right arm at tetanus injection site. She continues keflex and notes that the swelling is "much improved."  Tobacco abuse- still wanting to quit. She wishes to try the tobacco free electronic cigarettes.  Weight loss- she continues to work hard counting calories and exercising. She has lost some additional weight.   Review of Systems    see HPI Objective:   Physical Exam  Gen: awake, alert, NAD Ext:  R arm with mild bruising,  Resolution of erythema and induration.       Assessment & Plan:

## 2011-11-04 NOTE — Assessment & Plan Note (Signed)
Pt encouraged to continue her current weight loss efforts.

## 2011-11-04 NOTE — Patient Instructions (Addendum)
Please schedule a follow up appointment in 3 months.

## 2011-11-04 NOTE — Assessment & Plan Note (Signed)
Resolved

## 2011-11-04 NOTE — Assessment & Plan Note (Signed)
Pt counseled on smoking cessation.  

## 2011-11-05 LAB — BASIC METABOLIC PANEL
Calcium: 10.4 mg/dL (ref 8.4–10.5)
Creat: 0.74 mg/dL (ref 0.50–1.10)
Sodium: 136 mEq/L (ref 135–145)

## 2011-11-07 ENCOUNTER — Encounter: Payer: Self-pay | Admitting: Family

## 2011-11-07 DIAGNOSIS — E876 Hypokalemia: Secondary | ICD-10-CM

## 2011-11-07 MED ORDER — POTASSIUM CHLORIDE CRYS ER 20 MEQ PO TBCR: 20.0000 meq | EXTENDED_RELEASE_TABLET | Freq: Every day | ORAL | Status: DC

## 2011-11-19 ENCOUNTER — Ambulatory Visit: Payer: BC Managed Care – PPO | Admitting: Emergency Medicine

## 2011-11-19 ENCOUNTER — Encounter: Payer: Self-pay | Admitting: Family

## 2011-11-19 VITALS — BP 95/66 | HR 55 | Temp 98.1°F | Resp 17 | Ht 60.5 in | Wt 178.0 lb

## 2011-11-19 DIAGNOSIS — R300 Dysuria: Secondary | ICD-10-CM

## 2011-11-19 DIAGNOSIS — E876 Hypokalemia: Secondary | ICD-10-CM

## 2011-11-19 DIAGNOSIS — N3000 Acute cystitis without hematuria: Secondary | ICD-10-CM

## 2011-11-19 LAB — POCT UA - MICROSCOPIC ONLY: Casts, Ur, LPF, POC: NEGATIVE

## 2011-11-19 LAB — POCT URINALYSIS DIPSTICK
Bilirubin, UA: NEGATIVE
Glucose, UA: NEGATIVE
Ketones, UA: NEGATIVE
Spec Grav, UA: 1.015
Urobilinogen, UA: 0.2

## 2011-11-19 MED ORDER — SULFAMETHOXAZOLE-TRIMETHOPRIM 800-160 MG PO TABS: 1.0000 | ORAL_TABLET | Freq: Two times a day (BID) | ORAL | Status: AC

## 2011-11-19 MED ORDER — PHENAZOPYRIDINE HCL 200 MG PO TABS: 200.0000 mg | ORAL_TABLET | Freq: Three times a day (TID) | ORAL | Status: AC | PRN

## 2011-11-19 NOTE — Progress Notes (Signed)
Date:  11/19/2011   Name:  Chelsea Haley   DOB:  November 18, 1975   MRN:  [id number] Gender: female Age: 36 y.o.  PCP:  Lemont Fillers., NP    Chief Complaint: Back Pain   History of Present Illness:  Chelsea Haley is a 36 y.o. pleasant patient who presents with the following:  History of previous kidney infections.  Now has right flank pain and dysuria and frequency.  No fever or chills, nausea or vomiting. No discharge or dyspareunia or other complaints.  No stool change.  Has chronic blood in urine.  Has appt next week with urology.  Patient Active Problem List  Diagnosis  . Tobacco abuse  . Obesity  . Inappropriate sinus tachycardia  . Depression with anxiety  . PTSD (post-traumatic stress disorder)  . Seizure disorder  . Hyperlipidemia  . Migraines  . Frequent UTI  . Chest pain  . Dyspnea  . Routine general medical examination at a health care facility  . Cellulitis    Past Medical History  Diagnosis Date  . History of chicken pox   . Depression   . Migraines   . History of kidney stones   . History of seizure disorder     as a child. No medication since age 36.  Marland Kitchen Hyperlipidemia   . Hypertension   . Tachycardia   . History of frequent urinary tract infections   . GERD (gastroesophageal reflux disease)   . Binge eating   . Endometriosis     Past Surgical History  Procedure Date  . Cholecystectomy 2006  . Cesarean section 2002  . Pilonidal cyst excision 2003  . Laporoscopy     History  Substance Use Topics  . Smoking status: Former Smoker -- 1.0 packs/day for 20 years    Types: Cigarettes    Quit date: 10/19/2011  . Smokeless tobacco: Never Used   Comment: Has cut down to 1 cigarette every 2 weeks.  . Alcohol Use: 0.5 oz/week    1 drink(s) per week    Family History  Problem Relation Age of Onset  . Arthritis Father   . Hypertension Father   . Heart disease Maternal Grandmother     ICD  . Cancer Maternal Grandfather     lung  .  Diabetes Neg Hx     Allergies  Allergen Reactions  . Adhesive (Tape)     Skin sensitivity  . Banana     migraine  . Morphine And Related Itching    hallucinations  . Other Itching    WALNUTS.  Gums itch.  . Pertussis Vaccines     seizures    Medication list has been reviewed and updated.  Current Outpatient Prescriptions on File Prior to Visit  Medication Sig Dispense Refill  . Calcium Carbonate-Vitamin D (CALTRATE 600+D) 600-400 MG-UNIT per tablet Take 1 tablet by mouth 2 (two) times daily.      Marland Kitchen FLUoxetine (PROZAC) 20 MG tablet Take 20 mg by mouth daily.      . hydrochlorothiazide (HYDRODIURIL) 25 MG tablet Take 25 mg by mouth daily.      . metoprolol succinate (TOPROL-XL) 25 MG 24 hr tablet Take 25 mg by mouth daily.      . Multiple Vitamin (MULTIVITAMIN) capsule Take 1 capsule by mouth daily.      . nitrofurantoin (MACRODANTIN) 50 MG capsule Take 50 mg by mouth daily.      . Norethindrone Acet-Ethinyl Est (MICROGESTIN 1.5/30 PO) Take 1 tablet  by mouth daily.      . potassium chloride SA (K-DUR,KLOR-CON) 20 MEQ tablet Take 1 tablet (20 mEq total) by mouth daily.  30 tablet  2  . silver sulfADIAZINE (SILVADENE) 1 % cream Apply topically daily.  50 g  0    Review of Systems:  As per HPI, otherwise negative.    Physical Examination: Filed Vitals:   11/19/11 1104  BP: 95/66  Pulse: 55  Temp: 98.1 F (36.7 C)  Resp: 17   Filed Vitals:   11/19/11 1104  Height: 5' 0.5" (1.537 m)  Weight: 178 lb (80.74 kg)   Body mass index is 34.19 kg/(m^2). Ideal Body Weight: Weight in (lb) to have BMI = 25: 129.9    GEN: WDWN, NAD, Non-toxic, Alert & Oriented x 3 HEENT: Atraumatic, Normocephalic.  Ears and Nose: No external deformity. EXTR: No clubbing/cyanosis/edema NEURO: Normal gait.  PSYCH: Normally interactive. Conversant. Not depressed or anxious appearing.  Calm demeanor.  ABD:  Soft, NT, BS+  Assessment and Plan:   Cystitis Septra Pyridium Follow up with  Urology next week  Carmelina Dane, MD  Results for orders placed in visit on 11/19/11  POCT UA - MICROSCOPIC ONLY      Component Value Range   WBC, Ur, HPF, POC 2-4     RBC, urine, microscopic 0-1     Bacteria, U Microscopic 1+     Mucus, UA trace     Epithelial cells, urine per micros 5-9     Crystals, Ur, HPF, POC negative     Casts, Ur, LPF, POC negative     Yeast, UA negative    POCT URINALYSIS DIPSTICK      Component Value Range   Color, UA yellow     Clarity, UA cloudy     Glucose, UA negative     Bilirubin, UA negative     Ketones, UA negative     Spec Grav, UA 1.015     Blood, UA trace-intact     pH, UA 7.0     Protein, UA negative     Urobilinogen, UA 0.2     Nitrite, UA negative     Leukocytes, UA Trace

## 2011-11-21 MED ORDER — POTASSIUM CHLORIDE CRYS ER 20 MEQ PO TBCR: 20.0000 meq | EXTENDED_RELEASE_TABLET | Freq: Every day | ORAL | Status: DC

## 2011-11-23 ENCOUNTER — Encounter: Payer: Self-pay | Admitting: Family

## 2011-11-23 MED ORDER — CIPROFLOXACIN HCL 500 MG PO TABS: 500.0000 mg | ORAL_TABLET | Freq: Two times a day (BID) | ORAL | Status: AC

## 2011-12-26 ENCOUNTER — Ambulatory Visit (INDEPENDENT_AMBULATORY_CARE_PROVIDER_SITE_OTHER): Payer: BC Managed Care – PPO | Admitting: Family

## 2011-12-26 ENCOUNTER — Encounter: Payer: Self-pay | Admitting: Family

## 2011-12-26 VITALS — BP 102/80 | HR 83 | Temp 98.5°F | Resp 16 | Ht 62.0 in | Wt 181.1 lb

## 2011-12-26 DIAGNOSIS — G43909 Migraine, unspecified, not intractable, without status migrainosus: Secondary | ICD-10-CM

## 2011-12-26 MED ORDER — SUMATRIPTAN SUCCINATE 50 MG PO TABS: 50.0000 mg | ORAL_TABLET | ORAL | Status: DC | PRN

## 2011-12-26 MED ORDER — KETOROLAC TROMETHAMINE 30 MG/ML IJ SOLN
30.0000 mg | Freq: Once | INTRAMUSCULAR | Status: AC
  Administered 2011-12-26: 30 mg via INTRAMUSCULAR

## 2011-12-26 MED ORDER — KETOROLAC TROMETHAMINE 30 MG/ML IJ SOLN: 30.0000 mg | Freq: Once | INTRAMUSCULAR | Status: DC

## 2011-12-26 NOTE — Progress Notes (Signed)
Subjective:    Patient ID: Chelsea Haley, female    DOB: 04-Apr-1975, 36 y.o.   MRN: [id number]  HPI  Ms. Chelsea Haley is a 36 yr old female who presents today with chief complaint of HA.  She reports that her headache has been present x 5 days. No improvement despite naps/caffeine, dark rooms.  Mild photophobia.  Denies phonophobia. + pain on the right side.  HA waxes/wanes.  She reports 2 day hx of nausea but this is intermittent.   Review of Systems See HPI  Past Medical History  Diagnosis Date  . History of chicken pox   . Depression   . Migraines   . History of kidney stones   . History of seizure disorder     as a child. No medication since age 42.  Marland Kitchen Hyperlipidemia   . Hypertension   . Tachycardia   . History of frequent urinary tract infections   . GERD (gastroesophageal reflux disease)   . Binge eating   . Endometriosis     History   Social History  . Marital Status: Divorced    Spouse Name: N/A    Number of Children: 1  . Years of Education: N/A   Occupational History  .      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 Smoking status: Former Smoker -- 1.0 packs/day for 20 years    Types: Cigarettes    Quit date: 10/19/2011  . Smokeless tobacco: Never Used   Comment: Has cut down to 1 cigarette every 2 weeks.  . Alcohol Use: 0.5 oz/week    1 drink(s) per week  . Drug Use: Not on file  . Sexually Active: Not on file   Other Topics Concern  . Not on file   Social History Narrative   Regular exercise:  NoCaffeine use:  NoLives with sonWorks at [employer] [facility] [employer]- developmental English/readingMasters in Education    Past Surgical History  Procedure Date  . Cholecystectomy 2006  . Cesarean section 2002  . Pilonidal cyst excision 2003  . Laporoscopy     Family History  Problem Relation Age of Onset  . Arthritis Father   . Hypertension Father   . Heart disease Maternal Grandmother     ICD  . Cancer Maternal Grandfather     lung  . Diabetes Neg  Hx     Allergies  Allergen Reactions  . Adhesive (Tape)     Skin sensitivity  . Banana     migraine  . Morphine And Related Itching    hallucinations  . Other Itching    WALNUTS.  Gums itch.  . Pertussis Vaccines     seizures    Current Outpatient Prescriptions on File Prior to Visit  Medication Sig Dispense Refill  . Calcium Carbonate-Vitamin D (CALTRATE 600+D) 600-400 MG-UNIT per tablet Take 1 tablet by mouth 2 (two) times daily.      Marland Kitchen FLUoxetine (PROZAC) 20 MG tablet Take 20 mg by mouth daily.      . hydrochlorothiazide (HYDRODIURIL) 25 MG tablet Take 25 mg by mouth daily.      . metoprolol succinate (TOPROL-XL) 25 MG 24 hr tablet Take 25 mg by mouth daily.      . Multiple Vitamin (MULTIVITAMIN) capsule Take 1 capsule by mouth daily.      . nitrofurantoin (MACRODANTIN) 50 MG capsule Take 50 mg by mouth daily.      . Norethindrone Acet-Ethinyl Est (MICROGESTIN 1.5/30 PO) Take 1 tablet by  mouth daily.      . potassium chloride SA (K-DUR,KLOR-CON) 20 MEQ tablet Take 1 tablet (20 mEq total) by mouth daily.  30 tablet  2  . silver sulfADIAZINE (SILVADENE) 1 % cream Apply topically daily.  50 g  0  . traZODone (DESYREL) 100 MG tablet Take 50 mg by mouth at bedtime.      . SUMAtriptan (IMITREX) 50 MG tablet Take 1 tablet (50 mg total) by mouth every 2 (two) hours as needed for migraine.  10 tablet  0    BP 102/80  Pulse 83  Temp 98.5 F (36.9 C) (Oral)  Resp 16  Ht 5\' 2"  (1.575 m)  Wt 181 lb 1.3 oz (82.137 kg)  BMI 33.12 kg/m2  SpO2 99%       Objective:   Physical Exam  Constitutional: She appears well-developed and well-nourished. No distress.  HENT:  Head: Normocephalic and atraumatic.  Eyes: EOM are normal. Pupils are equal, round, and reactive to light.  Cardiovascular: Normal rate and regular rhythm.   No murmur heard. Pulmonary/Chest: Effort normal and breath sounds normal. No respiratory distress. She has no wheezes. She has no rales. She exhibits no  tenderness.  Skin: Skin is warm and dry.  Psychiatric: She has a normal mood and affect. Her behavior is normal. Judgment and thought content normal.          Assessment & Plan:

## 2011-12-26 NOTE — Patient Instructions (Addendum)
Migraine Headache °A migraine headache is very bad, throbbing pain on one or both sides of your head. Talk to your doctor about what things may bring on (trigger) your migraine headaches. °HOME CARE °· Only take medicines as told by your doctor. °· Lie down in a dark, quiet room when you have a migraine. °· Keep a journal to find out if certain things bring on migraine headaches. For example, write down: °· What you eat and drink. °· How much sleep you get. °· Any change to your diet or medicines. °· Lessen how much alcohol you drink. °· Quit smoking if you smoke. °· Get enough sleep. °· Lessen any stress in your life. °· Keep lights dim if bright lights bother you or make your migraines worse. °GET HELP RIGHT AWAY IF:  °· Your migraine becomes really bad. °· You have a fever. °· You have a stiff neck. °· You have trouble seeing. °· Your muscles are weak, or you lose muscle control. °· You lose your balance or have trouble walking. °· You feel like you will pass out (faint), or you pass out. °· You have really bad symptoms that are different than your first symptoms. °MAKE SURE YOU:  °· Understand these instructions. °· Will watch your condition. °· Will get help right away if you are not doing well or get worse. °Document Released: 12/08/2007 Document Revised: 05/23/2011 Document Reviewed: 02/18/2011 °[employer]® Patient Information ©2013 [employer]. ° °

## 2011-12-26 NOTE — Assessment & Plan Note (Signed)
Toradol IM in office today.  Plan imitrex as needed.  Pt instructed max 2 tabs/24 hrs. She is to call if symptoms worsen or if no improvement.

## 2011-12-27 ENCOUNTER — Encounter: Payer: Self-pay | Admitting: Family

## 2011-12-28 ENCOUNTER — Ambulatory Visit (INDEPENDENT_AMBULATORY_CARE_PROVIDER_SITE_OTHER): Payer: BC Managed Care – PPO | Admitting: Family

## 2011-12-28 ENCOUNTER — Telehealth: Payer: Self-pay | Admitting: Family

## 2011-12-28 VITALS — BP 102/82 | HR 66 | Temp 99.0°F | Resp 12 | Ht 62.0 in | Wt 183.0 lb

## 2011-12-28 DIAGNOSIS — G43909 Migraine, unspecified, not intractable, without status migrainosus: Secondary | ICD-10-CM

## 2011-12-28 MED ORDER — AMITRIPTYLINE HCL 25 MG PO TABS: 25.0000 mg | ORAL_TABLET | Freq: Every day | ORAL | Status: DC

## 2011-12-28 MED ORDER — TRAMADOL HCL 50 MG PO TABS: 50.0000 mg | ORAL_TABLET | Freq: Three times a day (TID) | ORAL | Status: DC | PRN

## 2011-12-28 NOTE — Assessment & Plan Note (Signed)
Unchanged.  Will place on Elavil HS in place of trazadone (which she tells me she is not using regularly).  Hopefully, elavil will help with migraine prophylaxis and sleep. Add short term tramadol to be used sparingly. She is instructed not to drive after taking as it may cause drowsiness. Also, will refer to neuro.

## 2011-12-28 NOTE — Patient Instructions (Addendum)
You will be contact about your referral to neurology.  Please let us know if you have not heard back within 1 week about your referral.

## 2011-12-28 NOTE — Telephone Encounter (Signed)
Caller: Charise/Patient; Patient Name: Chelsea Haley; PCP: Peggyann Juba, Melissa (Adults only); Best Callback Phone Number: [phone]  LMP 07/2011 -- Takes continous BCP -- Today, 12/28/2011, pt calling stating she had OV 12/26/2011  for HA that had been present since 12/22/2011 that was unrelieved  with Tylenol. She was given Toradol injection and  Imitrex rx and still having HA. Imitrex hasn't been  helping at all. She wakes with the HA and pain increases as day progresses  Rating 3/10 during call. RN reached See Provider within 24 hrs for typical headache and usual therapy not working per Headache protocol. . RN attempted to schedule appt with Dr Peggyann Juba , but  received error message of provider reaching "daily limit" in EPIC  - Shandi/Resource RN advised to call office and Judeth Cornfield was able to schedule appt for 1100 and pt advised

## 2011-12-28 NOTE — Progress Notes (Signed)
Subjective:    Patient ID: Chelsea Haley, female    DOB: 05-Dec-1975, 36 y.o.   MRN: [id number]  HPI  [name] is a 36 yr old female who presents today for follow up of her headache.  She was initially seen on 10/14 and treated with IM toradol.  She reports no improvement with toradol.  Yesterday she had mild relief with imitrex.  Hot shower worked the best.  Reports that HA worsened last night.  Having trouble sleeping.  She reports pain frontal and toward the right.  + nausea, mild photophobia.  Constant pain 5/5.  Was a 3 when she called this AM.    Review of Systems See HPI  Past Medical History  Diagnosis Date  . History of chicken pox   . Depression   . Migraines   . History of kidney stones   . History of seizure disorder     as a child. No medication since age 56.  Marland Kitchen Hyperlipidemia   . Hypertension   . Tachycardia   . History of frequent urinary tract infections   . GERD (gastroesophageal reflux disease)   . Binge eating   . Endometriosis     History   Social History  . Marital Status: Divorced    Spouse Name: N/A    Number of Children: 1  . Years of Education: N/A   Occupational History  .      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 Smoking status: Former Smoker -- 1.0 packs/day for 20 years    Types: Cigarettes    Quit date: 10/19/2011  . Smokeless tobacco: Never Used   Comment: Has cut down to 1 cigarette every 2 weeks.  . Alcohol Use: 0.5 oz/week    1 drink(s) per week  . Drug Use: Not on file  . Sexually Active: Not on file   Other Topics Concern  . Not on file   Social History Narrative   Regular exercise:  NoCaffeine use:  NoLives with sonWorks at [facility] [employer]- developmental English/readingMasters in Education    Past Surgical History  Procedure Date  . Cholecystectomy 2006  . Cesarean section 2002  . Pilonidal cyst excision 2003  . Laporoscopy     Family History  Problem Relation Age of Onset  . Arthritis Father     . Hypertension Father   . Heart disease Maternal Grandmother     ICD  . Cancer Maternal Grandfather     lung  . Diabetes Neg Hx     Allergies  Allergen Reactions  . Adhesive (Tape)     Skin sensitivity  . Banana     migraine  . Morphine And Related Itching    hallucinations  . Other Itching    WALNUTS.  Gums itch.  . Pertussis Vaccines     seizures    Current Outpatient Prescriptions on File Prior to Visit  Medication Sig Dispense Refill  . amitriptyline (ELAVIL) 25 MG tablet Take 1 tablet (25 mg total) by mouth at bedtime.  30 tablet  2  . Calcium Carbonate-Vitamin D (CALTRATE 600+D) 600-400 MG-UNIT per tablet Take 1 tablet by mouth 2 (two) times daily.      Marland Kitchen FLUoxetine (PROZAC) 20 MG tablet Take 20 mg by mouth daily.      . hydrochlorothiazide (HYDRODIURIL) 25 MG tablet Take 25 mg by mouth daily.      . metoprolol succinate (TOPROL-XL) 25 MG 24 hr tablet Take 25 mg  by mouth daily.      . Multiple Vitamin (MULTIVITAMIN) capsule Take 1 capsule by mouth daily.      . nitrofurantoin (MACRODANTIN) 50 MG capsule Take 50 mg by mouth daily.      . Norethindrone Acet-Ethinyl Est (MICROGESTIN 1.5/30 PO) Take 1 tablet by mouth daily.      . potassium chloride SA (K-DUR,KLOR-CON) 20 MEQ tablet Take 1 tablet (20 mEq total) by mouth daily.  30 tablet  2  . silver sulfADIAZINE (SILVADENE) 1 % cream Apply topically daily.  50 g  0  . SUMAtriptan (IMITREX) 50 MG tablet Take 1 tablet (50 mg total) by mouth every 2 (two) hours as needed for migraine.  10 tablet  0    BP 102/82  Pulse 66  Temp 99 F (37.2 C) (Oral)  Resp 12  Ht 5\' 2"  (1.575 m)  Wt 183 lb 0.6 oz (83.026 kg)  BMI 33.48 kg/m2  SpO2 99%       Objective:   Physical Exam  Constitutional: She appears well-developed and well-nourished. No distress.  Cardiovascular: Normal rate and regular rhythm.   No murmur heard. Pulmonary/Chest: Effort normal and breath sounds normal. No respiratory distress. She has no wheezes.  She has no rales. She exhibits no tenderness.  Psychiatric: She has a normal mood and affect. Her behavior is normal. Judgment and thought content normal.          Assessment & Plan:

## 2011-12-29 ENCOUNTER — Encounter: Payer: Self-pay | Admitting: Family

## 2011-12-29 ENCOUNTER — Telehealth: Payer: Self-pay | Admitting: *Deleted

## 2011-12-29 NOTE — Telephone Encounter (Signed)
Received call from pt stating she read on her medication package to use caution if taking tramadol and paxil together.  Discussed possibility of serotonin syndrome and signs / symptoms to watch for. Pt denies any current symptoms and voices understanding.  Please advise.

## 2011-12-30 NOTE — Telephone Encounter (Signed)
OK to use sparingly, short term.  I would not plan to keep her on both long term.

## 2011-12-30 NOTE — Telephone Encounter (Signed)
Notified pt. 

## 2012-02-03 ENCOUNTER — Ambulatory Visit: Payer: BC Managed Care – PPO | Admitting: Family

## 2012-02-06 ENCOUNTER — Ambulatory Visit (INDEPENDENT_AMBULATORY_CARE_PROVIDER_SITE_OTHER): Payer: BC Managed Care – PPO | Admitting: Family

## 2012-02-06 ENCOUNTER — Encounter: Payer: Self-pay | Admitting: Family

## 2012-02-06 VITALS — BP 100/60 | HR 79 | Temp 98.2°F | Resp 16 | Wt 190.1 lb

## 2012-02-06 DIAGNOSIS — B349 Viral infection, unspecified: Secondary | ICD-10-CM | POA: Insufficient documentation

## 2012-02-06 DIAGNOSIS — M545 Low back pain, unspecified: Secondary | ICD-10-CM

## 2012-02-06 DIAGNOSIS — B9789 Other viral agents as the cause of diseases classified elsewhere: Secondary | ICD-10-CM

## 2012-02-06 LAB — POCT URINALYSIS DIPSTICK
Glucose, UA: NEGATIVE
Leukocytes, UA: NEGATIVE
Nitrite, UA: NEGATIVE
Urobilinogen, UA: 0.2
pH, UA: 6

## 2012-02-06 NOTE — Assessment & Plan Note (Addendum)
UA notes only trace blood which is not new for her.  Last UA noted trace blood but micro was WNL for blood.  Will send urine for culture. Symptoms are most consistent with viral etiology.  Recommended tylenol or motrin prn, fluids, rest, call if not improved by the end of this week, or if fever >101.  Pt verbalizes understanding.

## 2012-02-06 NOTE — Progress Notes (Signed)
Subjective:    Patient ID: Chelsea Haley, female    DOB: 1975-08-27, 36 y.o.   MRN: [id number]  HPI  Chelsea Haley is a 36 yr old female who presents today with two concerns:  1) Fever- Wednesday had nausea.  Thursday night-  Legs started to ache- were sensitive to the touch. Achiness continued through the weekend.  Had low grade temp over the weekend- intermittent tmax 99.5.  Reports that she has sharp pains left lower back, mild right ear pain and + HA.  Denies dysuria of hematuria.   Review of Systems See HPI  Past Medical History  Diagnosis Date  . History of chicken pox   . Depression   . Migraines   . History of kidney stones   . History of seizure disorder     as a child. No medication since age 36.  Marland Kitchen Hyperlipidemia   . Hypertension   . Tachycardia   . History of frequent urinary tract infections   . GERD (gastroesophageal reflux disease)   . Binge eating   . Endometriosis     History   Social History  . Marital Status: Married    Spouse Name: N/A    Number of Children: 1  . Years of Education: N/A   Occupational History  .      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 Smoking status: Former Smoker -- 1.0 packs/day for 20 years    Types: Cigarettes    Quit date: 10/19/2011  . Smokeless tobacco: Never Used     Comment: Electronic cigarette. Has cut down to 1 cigarette every 2 weeks.  . Alcohol Use: 0.5 oz/week    1 drink(s) per week  . Drug Use: Not on file  . Sexually Active: Not on file   Other Topics Concern  . Not on file   Social History Narrative   Regular exercise:  NoCaffeine use:  NoLives with sonWorks at [employer]- developmental English/readingMasters in Education    Past Surgical History  Procedure Date  . Cholecystectomy 2006  . Cesarean section 2002  . Pilonidal cyst excision 2003  . Laporoscopy     Family History  Problem Relation Age of Onset  . Arthritis Father   . Hypertension Father   . Heart disease  Maternal Grandmother     ICD  . Cancer Maternal Grandfather     lung  . Diabetes Neg Hx     Allergies  Allergen Reactions  . Adhesive (Tape)     Skin sensitivity  . Banana     migraine  . Morphine And Related Itching    hallucinations  . Other Itching    WALNUTS.  Gums itch.  . Pertussis Vaccines     seizures    Current Outpatient Prescriptions on File Prior to Visit  Medication Sig Dispense Refill  . amitriptyline (ELAVIL) 25 MG tablet Take 1 tablet (25 mg total) by mouth at bedtime.  30 tablet  2  . Calcium Carbonate-Vitamin D (CALTRATE 600+D) 600-400 MG-UNIT per tablet Take 1 tablet by mouth 2 (two) times daily.      Marland Kitchen eletriptan (RELPAX) 20 MG tablet One tablet by mouth at onset of headache. May repeat in 2 hours if headache persists or recurs. may repeat in 2 hours if necessary      . FLUoxetine (PROZAC) 20 MG tablet Take 20 mg by mouth daily.      . hydrochlorothiazide (HYDRODIURIL) 25 MG tablet Take 25  mg by mouth daily.      . metoprolol succinate (TOPROL-XL) 25 MG 24 hr tablet Take 25 mg by mouth daily.      . Multiple Vitamin (MULTIVITAMIN) capsule Take 1 capsule by mouth daily.      . nitrofurantoin (MACRODANTIN) 50 MG capsule Take 50 mg by mouth daily.      . Norethindrone Acet-Ethinyl Est (MICROGESTIN 1.5/30 PO) Take 1 tablet by mouth daily.      . potassium chloride SA (K-DUR,KLOR-CON) 20 MEQ tablet Take 1 tablet (20 mEq total) by mouth daily.  30 tablet  2  . silver sulfADIAZINE (SILVADENE) 1 % cream Apply topically daily.  50 g  0  . SUMAtriptan (IMITREX) 50 MG tablet Take 1 tablet (50 mg total) by mouth every 2 (two) hours as needed for migraine.  10 tablet  0  . traMADol (ULTRAM) 50 MG tablet Take 1 tablet (50 mg total) by mouth every 8 (eight) hours as needed for pain.  20 tablet  0    BP 100/60  Pulse 79  Temp 98.2 F (36.8 C) (Oral)  Resp 16  Wt 190 lb 1.9 oz (86.238 kg)  SpO2 99%       Objective:   Physical Exam  Constitutional: She is oriented  to person, place, and time. She appears well-developed and well-nourished.  HENT:  Head: Normocephalic and atraumatic.  Eyes: No scleral icterus.  Cardiovascular: Normal rate and regular rhythm.   No murmur heard. Pulmonary/Chest: Effort normal and breath sounds normal. No respiratory distress. She has no wheezes. She has no rales. She exhibits no tenderness.  Abdominal: Soft. Bowel sounds are normal. She exhibits no distension and no mass. There is no tenderness. There is no rebound and no guarding.  Musculoskeletal: She exhibits no edema.  Lymphadenopathy:    She has no cervical adenopathy.  Neurological: She is alert and oriented to person, place, and time.  Skin: Skin is warm and dry. No rash noted. No erythema. No pallor.  Psychiatric: She has a normal mood and affect. Her behavior is normal. Judgment and thought content normal.          Assessment & Plan:

## 2012-02-06 NOTE — Patient Instructions (Addendum)
Viral Infections  A viral infection can be caused by different types of viruses.Most viral infections are not serious and resolve on their own. However, some infections may cause severe symptoms and may lead to further complications.  SYMPTOMS  Viruses can frequently cause:   Minor sore throat.   Aches and pains.   Headaches.   Runny nose.   Different types of rashes.   Watery eyes.   Tiredness.   Cough.   Loss of appetite.   Gastrointestinal infections, resulting in nausea, vomiting, and diarrhea.  These symptoms do not respond to antibiotics because the infection is not caused by bacteria. However, you might catch a bacterial infection following the viral infection. This is sometimes called a "superinfection." Symptoms of such a bacterial infection may include:   Worsening sore throat with pus and difficulty swallowing.   Swollen neck glands.   Chills and a high or persistent fever.   Severe headache.   Tenderness over the sinuses.   Persistent overall ill feeling (malaise), muscle aches, and tiredness (fatigue).   Persistent cough.   Yellow, green, or brown mucus production with coughing.  HOME CARE INSTRUCTIONS    Only take over-the-counter or prescription medicines for pain, discomfort, diarrhea, or fever as directed by your caregiver.   Drink enough water and fluids to keep your urine clear or pale yellow. Sports drinks can provide valuable electrolytes, sugars, and hydration.   Get plenty of rest and maintain proper nutrition. Soups and broths with crackers or rice are fine.  SEEK IMMEDIATE MEDICAL CARE IF:    You have severe headaches, shortness of breath, chest pain, neck pain, or an unusual rash.   You have uncontrolled vomiting, diarrhea, or you are unable to keep down fluids.   You or your child has an oral temperature above 102 F (38.9 C), not controlled by medicine.   Your baby is older than 3 months with a rectal temperature of 102 F (38.9 C) or higher.   Your baby is 3  months old or younger with a rectal temperature of 100.4 F (38 C) or higher.  MAKE SURE YOU:    Understand these instructions.   Will watch your condition.   Will get help right away if you are not doing well or get worse.  Document Released: 12/08/2004 Document Revised: 05/23/2011 Document Reviewed: 07/05/2010  [employer] Patient Information 2013 [employer].

## 2012-02-10 ENCOUNTER — Telehealth: Payer: Self-pay | Admitting: Family

## 2012-02-10 ENCOUNTER — Ambulatory Visit (INDEPENDENT_AMBULATORY_CARE_PROVIDER_SITE_OTHER): Payer: BC Managed Care – PPO | Admitting: Internal Medicine

## 2012-02-10 ENCOUNTER — Encounter: Payer: Self-pay | Admitting: Internal Medicine

## 2012-02-10 VITALS — BP 104/62 | HR 81 | Temp 98.3°F | Resp 18 | Wt 192.8 lb

## 2012-02-10 DIAGNOSIS — M545 Low back pain: Secondary | ICD-10-CM

## 2012-02-10 DIAGNOSIS — R109 Unspecified abdominal pain: Secondary | ICD-10-CM

## 2012-02-10 MED ORDER — CIPROFLOXACIN HCL 500 MG PO TABS: 500.0000 mg | ORAL_TABLET | Freq: Two times a day (BID) | ORAL | Status: DC

## 2012-02-10 NOTE — Telephone Encounter (Signed)
Pt seen in office 11.29.13/SLS

## 2012-02-10 NOTE — Progress Notes (Signed)
  Subjective:    Patient ID: Chelsea Haley, female    DOB: 1975-04-14, 36 y.o.   MRN: [id number]  HPI Pt presents to clinic for evaluation of flank pain. Notes one week h/o right upper back/flank pain that occasionally radiates forward but not fully to the groin. Has associated intermittent nausea without f/c. Denies any change in urination including frequency, urgency, dysuria or hematuria. Maintaining good uop. Has been attempting tylenol and motrin 800mg  prn. Does have h/o recurrent UTI followed by urology and maintained on macrobid daily prophylaxis. Seems to relate past h/o pyelonephritis by description. Was told had renal stones in the past but has not been evaluated for uretal stones or required intervention. Pain worse sometimes with movement.   Past Medical History  Diagnosis Date  . History of chicken pox   . Depression   . Migraines   . History of kidney stones   . History of seizure disorder     as a child. No medication since age 22.  Marland Kitchen Hyperlipidemia   . Hypertension   . Tachycardia   . History of frequent urinary tract infections   . GERD (gastroesophageal reflux disease)   . Binge eating   . Endometriosis    Past Surgical History  Procedure Date  . Cholecystectomy 2006  . Cesarean section 2002  . Pilonidal cyst excision 2003  . Laporoscopy     reports that she quit smoking about 3 months ago. Her smoking use included Cigarettes. She has a 20 pack-year smoking history. She has never used smokeless tobacco. She reports that she drinks about .5 ounces of alcohol per week. Her drug history not on file. family history includes Arthritis in her father; Cancer in her maternal grandfather; Heart disease in her maternal grandmother; and Hypertension in her father.  There is no history of Diabetes. Allergies  Allergen Reactions  . Adhesive (Tape)     Skin sensitivity  . Banana     migraine  . Morphine And Related Itching    hallucinations  . Other Itching    WALNUTS.   Gums itch.  . Pertussis Vaccines     seizures     Review of Systems see hpi     Objective:   Physical Exam  Nursing note and vitals reviewed. Constitutional: She appears well-developed and well-nourished. No distress.  HENT:  Head: Normocephalic and atraumatic.  Musculoskeletal:       Tenderness to palpation along right upper paraspinal muscle without overt spasm.   Neurological: She is alert.  Skin: She is not diaphoretic.  Psychiatric: She has a normal mood and affect.          Assessment & Plan:

## 2012-02-10 NOTE — Telephone Encounter (Signed)
Patient Information:  Caller Name: [place]  Phone: [phone]  Patient: Chelsea Haley, Chelsea Haley  Gender: Female  DOB: 09/12/1975  Age: 36 Years  PCP: Sandford Craze (Adults only)  Pregnant: No   Symptoms  Reason For Call & Symptoms: right flank pain  Reviewed Health History In EMR: Yes  Reviewed Medications In EMR: Yes  Reviewed Allergies In EMR: Yes  Reviewed Surgeries / Procedures: Yes  Date of Onset of Symptoms: 02/01/2012  Treatments Tried: seen in the office on 11/25  Treatments Tried Worked: No OB:  LMP: 07/13/2011  Guideline(s) Used:  Flank Pain  Disposition Per Guideline:   See Today in Office  Reason For Disposition Reached:   Moderate pain (e.g., interferes with normal activities or awakens from sleep)  Advice Given:  N/A  Office Follow Up:  Does the office need to follow up with this patient?: No  Instructions For The Office: N/A  Appointment Scheduled:  02/10/2012 15:30:00  RN Note:  Was awakened with the pain x 3 during the night, now has a dull ache.

## 2012-02-10 NOTE — Assessment & Plan Note (Addendum)
Differential dx reviewed with pt. While pain is reproducible with palpation cannot exclude infectious etiology. H/o recurrent uti and recent cx could have been sterile from daily abx prophylaxis. Recommend begin course of cipro today x 7days. Use ultram prn for pain (has at home.) Consider abd CT if pain persists. Present to ED over weekend with any worsening pain, f/c or vomiting. States understanding and agreement. Notify clinic of status on Monday.

## 2012-02-27 ENCOUNTER — Emergency Department (HOSPITAL_BASED_OUTPATIENT_CLINIC_OR_DEPARTMENT_OTHER)
Admission: EM | Admit: 2012-02-27 | Discharge: 2012-02-28 | Disposition: A | Discharge status: Home / Self Care | Payer: BC Managed Care – PPO | Attending: Emergency Medicine | Admitting: Emergency Medicine

## 2012-02-27 ENCOUNTER — Encounter: Payer: Self-pay | Admitting: Family

## 2012-02-27 ENCOUNTER — Telehealth: Payer: Self-pay | Admitting: Family

## 2012-02-27 ENCOUNTER — Emergency Department (HOSPITAL_BASED_OUTPATIENT_CLINIC_OR_DEPARTMENT_OTHER): Payer: BC Managed Care – PPO

## 2012-02-27 ENCOUNTER — Encounter (HOSPITAL_BASED_OUTPATIENT_CLINIC_OR_DEPARTMENT_OTHER): Payer: Self-pay | Admitting: *Deleted

## 2012-02-27 DIAGNOSIS — R109 Unspecified abdominal pain: Secondary | ICD-10-CM

## 2012-02-27 DIAGNOSIS — D1809 Hemangioma of other sites: Secondary | ICD-10-CM | POA: Insufficient documentation

## 2012-02-27 DIAGNOSIS — F329 Major depressive disorder, single episode, unspecified: Secondary | ICD-10-CM | POA: Insufficient documentation

## 2012-02-27 DIAGNOSIS — Z79899 Other long term (current) drug therapy: Secondary | ICD-10-CM | POA: Insufficient documentation

## 2012-02-27 DIAGNOSIS — I10 Essential (primary) hypertension: Secondary | ICD-10-CM | POA: Insufficient documentation

## 2012-02-27 DIAGNOSIS — Z87440 Personal history of urinary (tract) infections: Secondary | ICD-10-CM | POA: Insufficient documentation

## 2012-02-27 DIAGNOSIS — D1803 Hemangioma of intra-abdominal structures: Secondary | ICD-10-CM

## 2012-02-27 DIAGNOSIS — Z8669 Personal history of other diseases of the nervous system and sense organs: Secondary | ICD-10-CM | POA: Insufficient documentation

## 2012-02-27 DIAGNOSIS — Z3202 Encounter for pregnancy test, result negative: Secondary | ICD-10-CM | POA: Insufficient documentation

## 2012-02-27 DIAGNOSIS — N809 Endometriosis, unspecified: Secondary | ICD-10-CM | POA: Insufficient documentation

## 2012-02-27 DIAGNOSIS — Z87891 Personal history of nicotine dependence: Secondary | ICD-10-CM | POA: Insufficient documentation

## 2012-02-27 DIAGNOSIS — Z87442 Personal history of urinary calculi: Secondary | ICD-10-CM | POA: Insufficient documentation

## 2012-02-27 DIAGNOSIS — E785 Hyperlipidemia, unspecified: Secondary | ICD-10-CM | POA: Insufficient documentation

## 2012-02-27 DIAGNOSIS — Z8679 Personal history of other diseases of the circulatory system: Secondary | ICD-10-CM | POA: Insufficient documentation

## 2012-02-27 DIAGNOSIS — K219 Gastro-esophageal reflux disease without esophagitis: Secondary | ICD-10-CM | POA: Insufficient documentation

## 2012-02-27 DIAGNOSIS — F3289 Other specified depressive episodes: Secondary | ICD-10-CM | POA: Insufficient documentation

## 2012-02-27 LAB — URINALYSIS, ROUTINE W REFLEX MICROSCOPIC
Bilirubin Urine: NEGATIVE
Glucose, UA: NEGATIVE mg/dL
Ketones, ur: NEGATIVE mg/dL
Leukocytes, UA: NEGATIVE
Nitrite: NEGATIVE
Protein, ur: NEGATIVE mg/dL
Specific Gravity, Urine: 1.013 (ref 1.005–1.030)
Urobilinogen, UA: 0.2 mg/dL (ref 0.0–1.0)
pH: 5.5 (ref 5.0–8.0)

## 2012-02-27 LAB — COMPREHENSIVE METABOLIC PANEL
ALT: 8 U/L (ref 0–35)
AST: 16 U/L (ref 0–37)
Albumin: 3.9 g/dL (ref 3.5–5.2)
CO2: 22 mEq/L (ref 19–32)
Chloride: 104 mEq/L (ref 96–112)
Creatinine, Ser: 0.7 mg/dL (ref 0.50–1.10)
GFR calc non Af Amer: >90 mL/min (ref >90)
Sodium: 137 mEq/L (ref 135–145)
Total Bilirubin: 0.2 mg/dL — ABNORMAL LOW (ref 0.3–1.2)

## 2012-02-27 LAB — URINE MICROSCOPIC-ADD ON

## 2012-02-27 LAB — CBC WITH DIFFERENTIAL/PLATELET
Basophils Absolute: 0.1 10*3/uL (ref 0.0–0.1)
Basophils Relative: 1 % (ref 0–1)
Eosinophils Absolute: 0.1 K/uL (ref 0.0–0.7)
Eosinophils Relative: 1 % (ref 0–5)
HCT: 37.8 % (ref 36.0–46.0)
Hemoglobin: 13.1 g/dL (ref 12.0–15.0)
Lymphocytes Relative: 33 % (ref 12–46)
Lymphs Abs: 3.3 K/uL (ref 0.7–4.0)
MCH: 29.7 pg (ref 26.0–34.0)
MCHC: 34.7 g/dL (ref 30.0–36.0)
MCV: 85.7 fL (ref 78.0–100.0)
Monocytes Absolute: 0.5 K/uL (ref 0.1–1.0)
Monocytes Relative: 5 % (ref 3–12)
Neutro Abs: 6.3 10*3/uL (ref 1.7–7.7)
Neutrophils Relative %: 61 % (ref 43–77)
Platelets: 347 10*3/uL (ref 150–400)
RBC: 4.41 MIL/uL (ref 3.87–5.11)
RDW: 12.2 % (ref 11.5–15.5)
WBC: 10.3 10*3/uL (ref 4.0–10.5)

## 2012-02-27 LAB — COMPREHENSIVE METABOLIC PANEL WITH GFR
Alkaline Phosphatase: 25 U/L — ABNORMAL LOW (ref 39–117)
BUN: 9 mg/dL (ref 6–23)
Calcium: 9.3 mg/dL (ref 8.4–10.5)
GFR calc Af Amer: >90 mL/min (ref >90)
Glucose, Bld: 89 mg/dL (ref 70–99)
Potassium: 4 meq/L (ref 3.5–5.1)
Total Protein: 7.6 g/dL (ref 6.0–8.3)

## 2012-02-27 LAB — PREGNANCY, URINE: Preg Test, Ur: NEGATIVE

## 2012-02-27 LAB — LIPASE, BLOOD: Lipase: 41 U/L (ref 11–59)

## 2012-02-27 MED ORDER — HYDROMORPHONE HCL PF 1 MG/ML IJ SOLN
1.0000 mg | Freq: Once | INTRAMUSCULAR | Status: AC
  Administered 2012-02-27: 1 mg via INTRAVENOUS
  Filled 2012-02-27: qty 1

## 2012-02-27 MED ORDER — HYDROCODONE-ACETAMINOPHEN 5-325 MG PO TABS
2.0000 | ORAL_TABLET | Freq: Once | ORAL | Status: AC
  Administered 2012-02-27: 2 via ORAL
  Filled 2012-02-27: qty 2

## 2012-02-27 MED ORDER — SODIUM CHLORIDE 0.9 % IV SOLN
Freq: Once | INTRAVENOUS | Status: AC
  Administered 2012-02-27: 21:00:00 via INTRAVENOUS

## 2012-02-27 MED ORDER — IOHEXOL 300 MG/ML  SOLN
100.0000 mL | Freq: Once | INTRAMUSCULAR | Status: AC | PRN
  Administered 2012-02-27: 100 mL via INTRAVENOUS

## 2012-02-27 MED ORDER — HYDROCODONE-ACETAMINOPHEN 5-325 MG PO TABS: ORAL_TABLET | ORAL | Status: DC

## 2012-02-27 MED ORDER — IOHEXOL 300 MG/ML  SOLN: 25.0000 mL | INTRAMUSCULAR | Status: AC

## 2012-02-27 MED ORDER — ONDANSETRON HCL 4 MG/2ML IJ SOLN
4.0000 mg | Freq: Once | INTRAMUSCULAR | Status: AC
  Administered 2012-02-27: 4 mg via INTRAVENOUS
  Filled 2012-02-27: qty 2

## 2012-02-27 NOTE — ED Notes (Signed)
Pt c/o diffuse abd cramping x 1 week  With vaginal bleeding

## 2012-02-27 NOTE — ED Notes (Signed)
Pt. Reports she had a peanut butter sandwich and chips at lunch today.  Pt. Reports she had a piece of cake today at around 1640

## 2012-02-27 NOTE — Discharge Instructions (Signed)
 Abdominal Pain Abdominal pain can be caused by many things. Your caregiver decides the seriousness of your pain by an examination and possibly blood tests and X-rays. Many cases can be observed and treated at home. Most abdominal pain is not caused by a disease and will probably improve without treatment. However, in many cases, more time must pass before a clear cause of the pain can be found. Before that point, it may not be known if you need more testing, or if hospitalization or surgery is needed. HOME CARE INSTRUCTIONS   Do not take laxatives unless directed by your caregiver.  Take pain medicine only as directed by your caregiver.  Only take over-the-counter or prescription medicines for pain, discomfort, or fever as directed by your caregiver.  Try a clear liquid diet (broth, tea, or water) for as long as directed by your caregiver. Slowly move to a bland diet as tolerated. SEEK IMMEDIATE MEDICAL CARE IF:   The pain does not go away.  You have a fever.  You keep throwing up (vomiting).  The pain is felt only in portions of the abdomen. Pain in the right side could possibly be appendicitis. In an adult, pain in the left lower portion of the abdomen could be colitis or diverticulitis.  You pass bloody or black tarry stools. MAKE SURE YOU:   Understand these instructions.  Will watch your condition.  Will get help right away if you are not doing well or get worse. Document Released: 12/08/2004 Document Revised: 05/23/2011 Document Reviewed: 10/17/2007 [facility] Patient Information 2013 [place], [place].      Narcotic and benzodiazepine use may cause drowsiness, slowed breathing or dependence.  Please use with caution and do not drive, operate machinery or watch young children alone while taking them.  Taking combinations of these medications or drinking alcohol will potentiate these effects.    Abdominal Pain Abdominal pain can be caused by many things. Your caregiver decides  the seriousness of your pain by an examination and possibly blood tests and X-rays. Many cases can be observed and treated at home. Most abdominal pain is not caused by a disease and will probably improve without treatment. However, in many cases, more time must pass before a clear cause of the pain can be found. Before that point, it may not be known if you need more testing, or if hospitalization or surgery is needed. HOME CARE INSTRUCTIONS   Do not take laxatives unless directed by your caregiver.  Take pain medicine only as directed by your caregiver.  Only take over-the-counter or prescription medicines for pain, discomfort, or fever as directed by your caregiver.  Try a clear liquid diet (broth, tea, or water) for as long as directed by your caregiver. Slowly move to a bland diet as tolerated. SEEK IMMEDIATE MEDICAL CARE IF:   The pain does not go away.  You have a fever.  You keep throwing up (vomiting).  The pain is felt only in portions of the abdomen. Pain in the right side could possibly be appendicitis. In an adult, pain in the left lower portion of the abdomen could be colitis or diverticulitis.  You pass bloody or black tarry stools. MAKE SURE YOU:   Understand these instructions.  Will watch your condition.  Will get help right away if you are not doing well or get worse. Document Released: 12/08/2004 Document Revised: 05/23/2011 Document Reviewed: 10/17/2007 [facility] Patient Information 2013 [place], [place].    Narcotic and benzodiazepine use may cause drowsiness, slowed breathing  or dependence.  Please use with caution and do not drive, operate machinery or watch young children alone while taking them.  Taking combinations of these medications or drinking alcohol will potentiate these effects.

## 2012-02-27 NOTE — ED Notes (Signed)
Pt reports that she is on continous birth control (Junel),  No regular periods due to birthcontrol, upper abdominal cramping, pt reports some bleeding on/off, pt has chronic uti's, takes microdantin daily, just completed a course of cipro last week.

## 2012-02-27 NOTE — Telephone Encounter (Signed)
Patient Information:  Caller Name: [place]  Phone: [phone]  Patient: Chelsea Haley, Chelsea Haley  Gender: Female  DOB: 04-03-1975  Age: 36 Years  PCP: Sandford Craze (Adults only)  Pregnant: No  Office Follow Up:  Does the office need to follow up with this patient?: No  Instructions For The Office: N/A   Symptoms  Reason For Call & Symptoms: 02/26/12 she started bleeding vaginally and chest and abdominal pain. She is on BCP, but was recently on antibiotics.  Previously has been having abd pain and feeling of fullness x 1 week.  Pain got worse with bleeding 02/26/12.  States it feels like someone is sitting on her chest at times  Reviewed Health History In EMR: Yes  Reviewed Medications In EMR: Yes  Reviewed Allergies In EMR: Yes  Reviewed Surgeries / Procedures: Yes  Date of Onset of Symptoms: 02/26/2012  Treatments Tried: Has "gas pills" and Tyleno, but was only able to sleep for 2 hrs.  Treatments Tried Worked: No OB:  LMP: 02/26/2012  Guideline(s) Used:  Abdominal Pain - Female  Abdominal Pain - Upper  Disposition Per Guideline:   Go to ED Now.  Pt will go to [employer] with someone else driving.    Reason For Disposition Reached:   Pain lasting > 10 minutes and over 99 years old and associated chest, arm, neck, upper back, or jaw pain  Advice Given:  N/A

## 2012-02-27 NOTE — ED Provider Notes (Signed)
History   This chart was scribed for Gavin Pound. Oletta Lamas, MD, by Frederik Pear, ER scribe. The patient was seen in room MH08/MH08 and the patient's care was started at 1943.    CSN: [id number]  Arrival date & time 02/27/12  1308   First MD Initiated Contact with Patient 02/27/12 1943      Chief Complaint  Patient presents with  . Abdominal Cramping    (Consider location/radiation/quality/duration/timing/severity/associated sxs/prior treatment) HPI Comments: Chelsea Haley is a 36 y.o. female who presents to the Emergency Department complaining of constant, moderate epigastric cramping with associated nausea that began a week and a half ago. She reports 24 hours of very minimal vaginal bleeding. She states that she has been on continuous oral birth control a couple of years and the most recent medication for over a year for a h/o of endometriosis. She reports trying Tylenol and Gas Ex at home with no relief. She has a surgical h/o of a cholecystectomy, pilonidal cyst excision, laparoscopy, and a cesarean section. She denies any emesis or diarrhea. She is allergic to morphine.   PCP is Dr. Sandford Craze. OBGYN is Dr. Antionette Char.   Past Medical History  Diagnosis Date  . History of chicken pox   . Depression   . Migraines   . History of kidney stones   . History of seizure disorder     as a child. No medication since age 29.  Marland Kitchen Hyperlipidemia   . Hypertension   . Tachycardia   . History of frequent urinary tract infections   . GERD (gastroesophageal reflux disease)   . Binge eating   . Endometriosis     Past Surgical History  Procedure Date  . Cholecystectomy 2006  . Cesarean section 2002  . Pilonidal cyst excision 2003  . Laporoscopy     Family History  Problem Relation Age of Onset  . Arthritis Father   . Hypertension Father   . Heart disease Maternal Grandmother     ICD  . Cancer Maternal Grandfather     lung  . Diabetes Neg Hx     History   Substance Use Topics  . Smoking status: Former Smoker -- 1.0 packs/day for 20 years    Types: Cigarettes    Quit date: 10/19/2011  . Smokeless tobacco: Never Used     Comment: Electronic cigarette. Has cut down to 1 cigarette every 2 weeks.  . Alcohol Use: 0.5 oz/week    1 drink(s) per week    OB History    Grav Para Term Preterm Abortions TAB SAB Ect Mult Living                  Review of Systems  Gastrointestinal: Positive for nausea. Negative for vomiting and diarrhea.       Abdominal cramping  All other systems reviewed and are negative.    Allergies  Adhesive; Banana; Morphine and related; Other; and Pertussis vaccines  Home Medications   Current Outpatient Rx  Name  Route  Sig  Dispense  Refill  . AMITRIPTYLINE HCL 25 MG PO TABS   Oral   Take 1 tablet (25 mg total) by mouth at bedtime.   30 tablet   2   . CALCIUM CARBONATE-VITAMIN D 600-400 MG-UNIT PO TABS   Oral   Take 1 tablet by mouth 2 (two) times daily.         Marland Kitchen CIPROFLOXACIN HCL 500 MG PO TABS   Oral   Take 1 tablet (  500 mg total) by mouth 2 (two) times daily.   14 tablet   0   . ELETRIPTAN HYDROBROMIDE 20 MG PO TABS   Oral   One tablet by mouth at onset of headache. May repeat in 2 hours if headache persists or recurs. may repeat in 2 hours if necessary         . FLUOXETINE HCL 20 MG PO TABS   Oral   Take 20 mg by mouth daily.         Marland Kitchen HYDROCHLOROTHIAZIDE 25 MG PO TABS   Oral   Take 25 mg by mouth daily.         Marland Kitchen METOPROLOL SUCCINATE ER 25 MG PO TB24   Oral   Take 25 mg by mouth daily.         . MULTIVITAMINS PO CAPS   Oral   Take 1 capsule by mouth daily.         Marland Kitchen NITROFURANTOIN MACROCRYSTAL 50 MG PO CAPS   Oral   Take 50 mg by mouth daily.         Marland Kitchen MICROGESTIN 1.5/30 PO   Oral   Take 1 tablet by mouth daily.         Marland Kitchen POTASSIUM CHLORIDE CRYS ER 20 MEQ PO TBCR   Oral   Take 1 tablet (20 mEq total) by mouth daily.   30 tablet   2   . SILVER  SULFADIAZINE 1 % EX CREA   Topical   Apply topically daily.   50 g   0   . SUMATRIPTAN SUCCINATE 50 MG PO TABS   Oral   Take 1 tablet (50 mg total) by mouth every 2 (two) hours as needed for migraine.   10 tablet   0   . TRAMADOL HCL 50 MG PO TABS   Oral   Take 1 tablet (50 mg total) by mouth every 8 (eight) hours as needed for pain.   20 tablet   0     BP 131/83  Pulse 65  Temp 98.4 F (36.9 C)  Resp 16  Ht 5' (1.524 m)  Wt 185 lb (83.915 kg)  BMI 36.13 kg/m2  SpO2 100%  LMP 07/13/2011  Physical Exam  Nursing note and vitals reviewed. Constitutional: She appears well-developed and well-nourished.  HENT:  Head: Normocephalic and atraumatic.  Neck: Normal range of motion. Neck supple.  Cardiovascular: Normal rate.   Pulmonary/Chest: Effort normal. No respiratory distress.  Abdominal:       She is tender in the upper epigastric. She is mildly tender in the lower quadrant, which is her baseline pelvic pain.    Musculoskeletal: Normal range of motion.  Neurological: She is alert. No cranial nerve deficit.  Skin: Skin is warm and dry.  Psychiatric: She has a normal mood and affect. Thought content normal.    ED Course  Procedures (including critical care time)  DIAGNOSTIC STUDIES: Oxygen Saturation is 100% on room air, normal by my interpretation.    COORDINATION OF CARE:  20:12- Discussed planned course of treatment with the patient, including a UA, blood work and CT of her abdomen, who is agreeable at this time.  Results for orders placed during the hospital encounter of 02/27/12  URINALYSIS, ROUTINE W REFLEX MICROSCOPIC      Component Value Range   Color, Urine YELLOW  YELLOW   APPearance CLEAR  CLEAR   Specific Gravity, Urine 1.013  1.005 - 1.030   pH 5.5  5.0 -  8.0   Glucose, UA NEGATIVE  NEGATIVE mg/dL   Hgb urine dipstick TRACE (*) NEGATIVE   Bilirubin Urine NEGATIVE  NEGATIVE   Ketones, ur NEGATIVE  NEGATIVE mg/dL   Protein, ur NEGATIVE   NEGATIVE mg/dL   Urobilinogen, UA 0.2  0.0 - 1.0 mg/dL   Nitrite NEGATIVE  NEGATIVE   Leukocytes, UA NEGATIVE  NEGATIVE  PREGNANCY, URINE      Component Value Range   Preg Test, Ur NEGATIVE  NEGATIVE  URINE MICROSCOPIC-ADD ON      Component Value Range   Squamous Epithelial / LPF RARE  RARE   WBC, UA 0-2  <3 WBC/hpf   RBC / HPF 3-6  <3 RBC/hpf   Bacteria, UA FEW (*) RARE   Urine-Other MUCOUS PRESENT    CBC WITH DIFFERENTIAL      Component Value Range   WBC 10.3  4.0 - 10.5 K/uL   RBC 4.41  3.87 - 5.11 MIL/uL   Hemoglobin 13.1  12.0 - 15.0 g/dL   HCT 16.1  09.6 - 04.5 %   MCV 85.7  78.0 - 100.0 fL   MCH 29.7  26.0 - 34.0 pg   MCHC 34.7  30.0 - 36.0 g/dL   RDW 40.9  81.1 - 91.4 %   Platelets 347  150 - 400 K/uL   Neutrophils Relative 61  43 - 77 %   Neutro Abs 6.3  1.7 - 7.7 K/uL   Lymphocytes Relative 33  12 - 46 %   Lymphs Abs 3.3  0.7 - 4.0 K/uL   Monocytes Relative 5  3 - 12 %   Monocytes Absolute 0.5  0.1 - 1.0 K/uL   Eosinophils Relative 1  0 - 5 %   Eosinophils Absolute 0.1  0.0 - 0.7 K/uL   Basophils Relative 1  0 - 1 %   Basophils Absolute 0.1  0.0 - 0.1 K/uL  COMPREHENSIVE METABOLIC PANEL      Component Value Range   Sodium 137  135 - 145 mEq/L   Potassium 4.0  3.5 - 5.1 mEq/L   Chloride 104  96 - 112 mEq/L   CO2 22  19 - 32 mEq/L   Glucose, Bld 89  70 - 99 mg/dL   BUN 9  6 - 23 mg/dL   Creatinine, Ser 7.82  0.50 - 1.10 mg/dL   Calcium 9.3  8.4 - 95.6 mg/dL   Total Protein 7.6  6.0 - 8.3 g/dL   Albumin 3.9  3.5 - 5.2 g/dL   AST 16  0 - 37 U/L   ALT 8  0 - 35 U/L   Alkaline Phosphatase 25 (*) 39 - 117 U/L   Total Bilirubin 0.2 (*) 0.3 - 1.2 mg/dL   GFR calc non Af Amer >90  >90 mL/min   GFR calc Af Amer >90  >90 mL/min  LIPASE, BLOOD      Component Value Range   Lipase 41  11 - 59 U/L    Labs Reviewed  URINALYSIS, ROUTINE W REFLEX MICROSCOPIC - Abnormal; Notable for the following:    Hgb urine dipstick TRACE (*)     All other components within  normal limits  URINE MICROSCOPIC-ADD ON - Abnormal; Notable for the following:    Bacteria, UA FEW (*)     All other components within normal limits  COMPREHENSIVE METABOLIC PANEL - Abnormal; Notable for the following:    Alkaline Phosphatase 25 (*)     Total Bilirubin 0.2 (*)  All other components within normal limits  PREGNANCY, URINE  CBC WITH DIFFERENTIAL  LIPASE, BLOOD   Results for orders placed during the hospital encounter of 02/27/12  URINALYSIS, ROUTINE W REFLEX MICROSCOPIC      Component Value Range   Color, Urine YELLOW  YELLOW   APPearance CLEAR  CLEAR   Specific Gravity, Urine 1.013  1.005 - 1.030   pH 5.5  5.0 - 8.0   Glucose, UA NEGATIVE  NEGATIVE mg/dL   Hgb urine dipstick TRACE (*) NEGATIVE   Bilirubin Urine NEGATIVE  NEGATIVE   Ketones, ur NEGATIVE  NEGATIVE mg/dL   Protein, ur NEGATIVE  NEGATIVE mg/dL   Urobilinogen, UA 0.2  0.0 - 1.0 mg/dL   Nitrite NEGATIVE  NEGATIVE   Leukocytes, UA NEGATIVE  NEGATIVE  PREGNANCY, URINE      Component Value Range   Preg Test, Ur NEGATIVE  NEGATIVE  URINE MICROSCOPIC-ADD ON      Component Value Range   Squamous Epithelial / LPF RARE  RARE   WBC, UA 0-2  <3 WBC/hpf   RBC / HPF 3-6  <3 RBC/hpf   Bacteria, UA FEW (*) RARE   Urine-Other MUCOUS PRESENT    CBC WITH DIFFERENTIAL      Component Value Range   WBC 10.3  4.0 - 10.5 K/uL   RBC 4.41  3.87 - 5.11 MIL/uL   Hemoglobin 13.1  12.0 - 15.0 g/dL   HCT 16.1  09.6 - 04.5 %   MCV 85.7  78.0 - 100.0 fL   MCH 29.7  26.0 - 34.0 pg   MCHC 34.7  30.0 - 36.0 g/dL   RDW 40.9  81.1 - 91.4 %   Platelets 347  150 - 400 K/uL   Neutrophils Relative 61  43 - 77 %   Neutro Abs 6.3  1.7 - 7.7 K/uL   Lymphocytes Relative 33  12 - 46 %   Lymphs Abs 3.3  0.7 - 4.0 K/uL   Monocytes Relative 5  3 - 12 %   Monocytes Absolute 0.5  0.1 - 1.0 K/uL   Eosinophils Relative 1  0 - 5 %   Eosinophils Absolute 0.1  0.0 - 0.7 K/uL   Basophils Relative 1  0 - 1 %   Basophils Absolute 0.1  0.0  - 0.1 K/uL  COMPREHENSIVE METABOLIC PANEL      Component Value Range   Sodium 137  135 - 145 mEq/L   Potassium 4.0  3.5 - 5.1 mEq/L   Chloride 104  96 - 112 mEq/L   CO2 22  19 - 32 mEq/L   Glucose, Bld 89  70 - 99 mg/dL   BUN 9  6 - 23 mg/dL   Creatinine, Ser 7.82  0.50 - 1.10 mg/dL   Calcium 9.3  8.4 - 95.6 mg/dL   Total Protein 7.6  6.0 - 8.3 g/dL   Albumin 3.9  3.5 - 5.2 g/dL   AST 16  0 - 37 U/L   ALT 8  0 - 35 U/L   Alkaline Phosphatase 25 (*) 39 - 117 U/L   Total Bilirubin 0.2 (*) 0.3 - 1.2 mg/dL   GFR calc non Af Amer >90  >90 mL/min   GFR calc Af Amer >90  >90 mL/min  LIPASE, BLOOD      Component Value Range   Lipase 41  11 - 59 U/L   Ct Abdomen Pelvis W Contrast  02/27/2012  *RADIOLOGY REPORT*  Clinical Data: Abdominal  pelvic pain, history of endometriosis.  CT ABDOMEN AND PELVIS WITH CONTRAST  Technique:  Multidetector CT imaging of the abdomen and pelvis was performed following the standard protocol during bolus administration of intravenous contrast.  Contrast: OMNIPAQUE IOHEXOL 300 MG/ML  SOLN  Comparison: None.  Findings: Respiratory motion degrades evaluation of the lung bases. Grossly clear.  The hepatic dome is excluded from the study. There are two hemangiomas within the right hepatic lobe measuring 2.5 cm and 3.2 cm.  There may be a smaller hemangioma within the left hepatic lobe on image 8.  Otherwise, homogeneous hepatic enhancement.  Unremarkable spleen, pancreas, adrenal glands.  Absent gallbladder. No biliary ductal dilatation.  Symmetric renal enhancement.  No hydronephrosis or hydroureter.  No bowel obstruction.  No CT evidence for colitis.  No free intraperitoneal air or fluid.  No lymphadenopathy.  Normal caliber aorta and branch vessels.  Mild scattered atherosclerosis.  Thin-walled bladder.  Unremarkable CT appearance to the uterus and adnexa.  No acute osseous finding.  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  No acute abdominopelvic  process.   Original Report Authenticated By: Jearld Lesch, M.D.       Ct Abdomen Pelvis W Contrast  02/27/2012  *RADIOLOGY REPORT*  Clinical Data: Abdominal pelvic pain, history of endometriosis.  CT ABDOMEN AND PELVIS WITH CONTRAST  Technique:  Multidetector CT imaging of the abdomen and pelvis was performed following the standard protocol during bolus administration of intravenous contrast.  Contrast: OMNIPAQUE IOHEXOL 300 MG/ML  SOLN  Comparison: None.  Findings: Respiratory motion degrades evaluation of the lung bases. Grossly clear.  The hepatic dome is excluded from the study. There are two hemangiomas within the right hepatic lobe measuring 2.5 cm and 3.2 cm.  There may be a smaller hemangioma within the left hepatic lobe on image 8.  Otherwise, homogeneous hepatic enhancement.  Unremarkable spleen, pancreas, adrenal glands.  Absent gallbladder. No biliary ductal dilatation.  Symmetric renal enhancement.  No hydronephrosis or hydroureter.  No bowel obstruction.  No CT evidence for colitis.  No free intraperitoneal air or fluid.  No lymphadenopathy.  Normal caliber aorta and branch vessels.  Mild scattered atherosclerosis.  Thin-walled bladder.  Unremarkable CT appearance to the uterus and adnexa.  No acute osseous finding.  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  No acute abdominopelvic process.   Original Report Authenticated By: Jearld Lesch, M.D.      1. Abdominal pain   2. Hepatic hemangioma     11:36 PM Pt felt improved, understood results from CT scan, knows to follow up with PCP and also with GYN given h/o endometriosis.    MDM  I personally performed the services described in this documentation, which was scribed in my presence. The recorded information has been reviewed and is accurate.    Pt with diffuse upper abd tenderness, no sig guard or rebound.  Pt with h/o several abd surgeries in the past, consideration is partial or intermittent  obstruction.  CT per radiologist shows no acute abd, hemangioma of liver as likely incidental findings.  Pt and family informed, encouraged close follow up with OB/GYN.        Gavin Pound. Oletta Lamas, MD 02/27/12 2336

## 2012-02-27 NOTE — Telephone Encounter (Signed)
Pls call pt to arrange office visit. If severe/worsening pain, unable to keep down food/liquid, she should go to ED.

## 2012-02-27 NOTE — ED Notes (Signed)
Pt reports constant pain, yet intensity changes

## 2012-02-27 NOTE — Telephone Encounter (Signed)
Left detailed message on cell # re: instructions below. 

## 2012-02-28 ENCOUNTER — Encounter: Payer: Self-pay | Admitting: Family

## 2012-02-28 ENCOUNTER — Ambulatory Visit (INDEPENDENT_AMBULATORY_CARE_PROVIDER_SITE_OTHER): Payer: BC Managed Care – PPO | Admitting: Family

## 2012-02-28 VITALS — BP 100/74 | HR 79 | Temp 97.9°F | Resp 16 | Ht 62.0 in | Wt 194.1 lb

## 2012-02-28 DIAGNOSIS — L0291 Cutaneous abscess, unspecified: Secondary | ICD-10-CM

## 2012-02-28 DIAGNOSIS — D1803 Hemangioma of intra-abdominal structures: Secondary | ICD-10-CM | POA: Insufficient documentation

## 2012-02-28 DIAGNOSIS — R1013 Epigastric pain: Secondary | ICD-10-CM

## 2012-02-28 MED ORDER — CEPHALEXIN 500 MG PO CAPS: ORAL_CAPSULE | ORAL | Status: DC

## 2012-02-28 MED ORDER — OMEPRAZOLE 40 MG PO CPDR: 40.0000 mg | DELAYED_RELEASE_CAPSULE | Freq: Every day | ORAL | Status: DC

## 2012-02-28 MED ORDER — ONDANSETRON HCL 4 MG PO TABS: 4.0000 mg | ORAL_TABLET | Freq: Three times a day (TID) | ORAL | Status: DC | PRN

## 2012-02-28 NOTE — Assessment & Plan Note (Addendum)
Reviewed lab work and CT from the ED.  CT is significant only for 2 liver hemangiomas which were present on ultrasound 2007.  Tolerating PO's.  ? PUD, will initiate PPI and refer to GI for further evaluation. She is instructed to go to the ED if she is unable to tolerate PO's, if she develops black/bloody stools or if symptoms worsen. She verbalizes understanding.  Add zofran prn nausea.

## 2012-02-28 NOTE — Progress Notes (Signed)
Subjective:    Patient ID: Chelsea Haley, female    DOB: Nov 30, 1975, 36 y.o.   MRN: [id number]  HPI  Epigastric pressure started 10 days ago. Sunday had some breakthrough bleeding. Pain is constant but ranges 2-7/10.  She reports associated nausea. BM normal.  Went to the ED last night and had a CT performed.  Noted small liver hemangiomas.  Was given dilaudid and vicodin which helped briefly with her pain.  Bleeding has stopped.  She is tolerating PO's (had a cheese sandwich).  Denies vomitting.  Denies burning or frequency of urination. She denies unusual low back pain.  She does reports that she is breathing shallow due to abdominal discomfort worsening with deep breath.   OCP- she has been on OCP's for "years and years. "     Review of Systems See HPI  Past Medical History  Diagnosis Date  . History of chicken pox   . Depression   . Migraines   . History of kidney stones   . History of seizure disorder     as a child. No medication since age 56.  Marland Kitchen Hyperlipidemia   . Hypertension   . Tachycardia   . History of frequent urinary tract infections   . GERD (gastroesophageal reflux disease)   . Binge eating   . Endometriosis     History   Social History  . Marital Status: Married    Spouse Name: N/A    Number of Children: 1  . Years of Education: N/A   Occupational History  .      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 Smoking status: Former Smoker -- 1.0 packs/day for 20 years    Types: Cigarettes    Quit date: 10/19/2011  . Smokeless tobacco: Never Used     Comment: Electronic cigarette. Has cut down to 1 cigarette every 2 weeks.  . Alcohol Use: 0.5 oz/week    1 drink(s) per week  . Drug Use: Not on file  . Sexually Active: Not on file   Other Topics Concern  . Not on file   Social History Narrative   Regular exercise:  NoCaffeine use:  NoLives with sonWorks at [facility]- developmental English/readingMasters in Education    Past  Surgical History  Procedure Date  . Cholecystectomy 2006  . Cesarean section 2002  . Pilonidal cyst excision 2003  . Laporoscopy     Family History  Problem Relation Age of Onset  . Arthritis Father   . Hypertension Father   . Heart disease Maternal Grandmother     ICD  . Cancer Maternal Grandfather     lung  . Diabetes Neg Hx     Allergies  Allergen Reactions  . Adhesive (Tape)     Skin sensitivity  . Banana     migraine  . Morphine And Related Itching    hallucinations  . Other Itching    WALNUTS.  Gums itch.  . Pertussis Vaccines     seizures  . Tramadol Anxiety    Current Outpatient Prescriptions on File Prior to Visit  Medication Sig Dispense Refill  . amitriptyline (ELAVIL) 25 MG tablet Take 1 tablet (25 mg total) by mouth at bedtime.  30 tablet  2  . Calcium Carbonate-Vitamin D (CALTRATE 600+D) 600-400 MG-UNIT per tablet Take 1 tablet by mouth 2 (two) times daily.      . ciprofloxacin (CIPRO) 500 MG tablet Take 1 tablet (500 mg total) by mouth 2 (  two) times daily.  14 tablet  0  . eletriptan (RELPAX) 20 MG tablet One tablet by mouth at onset of headache. May repeat in 2 hours if headache persists or recurs. may repeat in 2 hours if necessary      . FLUoxetine (PROZAC) 20 MG tablet Take 20 mg by mouth daily.      . hydrochlorothiazide (HYDRODIURIL) 25 MG tablet Take 25 mg by mouth daily.      Marland Kitchen HYDROcodone-acetaminophen (NORCO/VICODIN) 5-325 MG per tablet 1-2 tablets po q 6 hours prn moderate to severe pain  20 tablet  0  . metoprolol succinate (TOPROL-XL) 25 MG 24 hr tablet Take 25 mg by mouth daily.      . Multiple Vitamin (MULTIVITAMIN) capsule Take 1 capsule by mouth daily.      . nitrofurantoin (MACRODANTIN) 50 MG capsule Take 50 mg by mouth daily.      . Norethindrone Acet-Ethinyl Est (MICROGESTIN 1.5/30 PO) Take 1 tablet by mouth daily.      . potassium chloride SA (K-DUR,KLOR-CON) 20 MEQ tablet Take 1 tablet (20 mEq total) by mouth daily.  30 tablet  2  .  silver sulfADIAZINE (SILVADENE) 1 % cream Apply topically daily.  50 g  0  . traMADol (ULTRAM) 50 MG tablet Take 1 tablet (50 mg total) by mouth every 8 (eight) hours as needed for pain.  20 tablet  0    BP 100/74  Pulse 79  Temp 97.9 F (36.6 C) (Oral)  Resp 16  Ht 5\' 2"  (1.575 m)  Wt 194 lb 1.9 oz (88.052 kg)  BMI 35.50 kg/m2  SpO2 99%  LMP 07/13/2011       Objective:   Physical Exam  Constitutional: She appears well-developed and well-nourished. No distress.  Cardiovascular: Normal rate and regular rhythm.   No murmur heard. Pulmonary/Chest: Effort normal and breath sounds normal. No respiratory distress. She has no wheezes. She has no rales. She exhibits no tenderness.  Abdominal: Soft. Bowel sounds are normal. She exhibits no distension and no mass. There is tenderness in the epigastric area. There is no rebound.  Skin: Skin is warm and dry.       Small axillary abscess <pea sized noted beneath skin  Psychiatric: She has a normal mood and affect. Her behavior is normal. Judgment and thought content normal.          Assessment & Plan:

## 2012-02-28 NOTE — Patient Instructions (Addendum)
Start omeprazole, go to ER if unable to keep down food/drink. Follow up in 1 month. Schedule a follow up visit with Dr. Tamela Oddi.

## 2012-02-28 NOTE — Assessment & Plan Note (Signed)
Small, will rx with keflex.

## 2012-02-28 NOTE — Assessment & Plan Note (Signed)
These do not appear new, and I doubt that this is cause of her pain.  She may need to consider a non-hormonal birth control.  I have given her a copy of her CT abdomen for her to bring to her GYN. Defer OCP changes to GYN.

## 2012-03-01 ENCOUNTER — Encounter: Payer: Self-pay | Admitting: Internal Medicine

## 2012-03-04 ENCOUNTER — Encounter: Payer: Self-pay | Admitting: Family

## 2012-03-06 ENCOUNTER — Encounter: Payer: Self-pay | Admitting: Family

## 2012-03-06 ENCOUNTER — Ambulatory Visit (INDEPENDENT_AMBULATORY_CARE_PROVIDER_SITE_OTHER): Payer: BC Managed Care – PPO | Admitting: Family

## 2012-03-06 VITALS — BP 108/80 | HR 85 | Resp 18 | Wt 193.0 lb

## 2012-03-06 DIAGNOSIS — F341 Dysthymic disorder: Secondary | ICD-10-CM

## 2012-03-06 DIAGNOSIS — F418 Other specified anxiety disorders: Secondary | ICD-10-CM

## 2012-03-06 DIAGNOSIS — M79603 Pain in arm, unspecified: Secondary | ICD-10-CM

## 2012-03-06 DIAGNOSIS — M79609 Pain in unspecified limb: Secondary | ICD-10-CM

## 2012-03-06 MED ORDER — FLUOXETINE HCL 40 MG PO CAPS: 40.0000 mg | ORAL_CAPSULE | Freq: Every day | ORAL | Status: DC

## 2012-03-06 NOTE — Assessment & Plan Note (Addendum)
Deteriorated.  Increase fluoxetine from 20mg  to 40mg .  I recommended that she establish with a new therapist.  She declines referral as she cannot afford the copays, but she plans to establish at [facility]. 25 minutes spent with pt today.  50% of this time was spent counseling pt on her anxiety/depression.  I doubt that her dose of odansatron contributed to any of her issues yesterday.  Med list was reviewed with t.

## 2012-03-06 NOTE — Progress Notes (Signed)
Subjective:    Patient ID: Chelsea Haley, female    DOB: 1975/06/02, 36 y.o.   MRN: [id number]  HPI  Chelsea Haley is a 36 yr old female who presents today with chief complaint of feeling "overwhelmed."  She is tearful today and describes an argument at her parent's house last night.  Her mother's cousin called the police to come to the house.  Her mother's cousin also to the police that she threatened her mother with a knife.  The patient denies this and says that it was a lie, and that she was only, "cutting a piece of cheese."  Per the patient, her mother also verified this with the police.  She called the "crisis team" at [facility] where she has undergone group counseling in the past who sent out a representative to her home.  Per the patient the representative thought that she was suffering from being overwhelmed and stressed and not from "mental health problems."  She was told by her mother last night that she "can never go back" to their home again.  She is upset by this. She is also upset because her father is in the hospital due to mental health issues.  She reports that she is not sleeping well.  Spends most of her day on the couch.  Feels overwhelmed and frustrated.  She remains unemployed since October, but is "supposed to start a job in January."  Her mother was to pick her son up from school and now she is not sure that this is going to be an option in light of last nights events.    She does tell me that last night she took odansetron instead of omeprazole, "because they both started with O."   Review of Systems See HPI  Past Medical History  Diagnosis Date  . History of chicken pox   . Depression   . Migraines   . History of kidney stones   . History of seizure disorder     as a child. No medication since age 25.  Marland Kitchen Hyperlipidemia   . Hypertension   . Tachycardia   . History of frequent urinary tract infections   . GERD (gastroesophageal reflux disease)   . Binge eating   .  Endometriosis     History   Social History  . Marital Status: Married    Spouse Name: N/A    Number of Children: 1  . Years of Education: N/A   Occupational History  .      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 Smoking status: Former Smoker -- 1.0 packs/day for 20 years    Types: Cigarettes    Quit date: 10/19/2011  . Smokeless tobacco: Never Used     Comment: Electronic cigarette. Has cut down to 1 cigarette every 2 weeks.  . Alcohol Use: 0.5 oz/week    1 drink(s) per week  . Drug Use: Not on file  . Sexually Active: Not on file   Other Topics Concern  . Not on file   Social History Narrative   Regular exercise:  NoCaffeine use:  NoLives with sonWorks at [facility]- developmental English/readingMasters in Education    Past Surgical History  Procedure Date  . Cholecystectomy 2006  . Cesarean section 2002  . Pilonidal cyst excision 2003  . Laporoscopy     Family History  Problem Relation Age of Onset  . Arthritis Father   . Hypertension Father   . Heart disease Maternal  Grandmother     ICD  . Cancer Maternal Grandfather     lung  . Diabetes Neg Hx     Allergies  Allergen Reactions  . Adhesive (Tape)     Skin sensitivity  . Banana     migraine  . Morphine And Related Itching    hallucinations  . Other Itching    WALNUTS.  Gums itch.  . Pertussis Vaccines     seizures  . Tramadol Anxiety    Current Outpatient Prescriptions on File Prior to Visit  Medication Sig Dispense Refill  . amitriptyline (ELAVIL) 25 MG tablet Take 1 tablet (25 mg total) by mouth at bedtime.  30 tablet  2  . Calcium Carbonate-Vitamin D (CALTRATE 600+D) 600-400 MG-UNIT per tablet Take 1 tablet by mouth 2 (two) times daily.      . cephALEXin (KEFLEX) 500 MG capsule One tablet by mouth 4 x daily for 1 week. Use back up birth control while on antibiotics.  28 capsule  0  . eletriptan (RELPAX) 20 MG tablet One tablet by mouth at onset of headache. May repeat in 2  hours if headache persists or recurs. may repeat in 2 hours if necessary      . hydrochlorothiazide (HYDRODIURIL) 25 MG tablet Take 25 mg by mouth daily.      Marland Kitchen HYDROcodone-acetaminophen (NORCO/VICODIN) 5-325 MG per tablet 1-2 tablets po q 6 hours prn moderate to severe pain  20 tablet  0  . metoprolol succinate (TOPROL-XL) 25 MG 24 hr tablet Take 25 mg by mouth daily.      . Multiple Vitamin (MULTIVITAMIN) capsule Take 1 capsule by mouth daily.      . nitrofurantoin (MACRODANTIN) 50 MG capsule Take 50 mg by mouth daily.      . Norethindrone Acet-Ethinyl Est (MICROGESTIN 1.5/30 PO) Take 1 tablet by mouth daily.      Marland Kitchen omeprazole (PRILOSEC) 40 MG capsule Take 1 capsule (40 mg total) by mouth daily.  30 capsule  1  . ondansetron (ZOFRAN) 4 MG tablet Take 1 tablet (4 mg total) by mouth every 8 (eight) hours as needed for nausea.  20 tablet  0  . potassium chloride SA (K-DUR,KLOR-CON) 20 MEQ tablet Take 1 tablet (20 mEq total) by mouth daily.  30 tablet  2  . silver sulfADIAZINE (SILVADENE) 1 % cream Apply topically daily.  50 g  0  . FLUoxetine (PROZAC) 40 MG capsule Take 1 capsule (40 mg total) by mouth daily.  30 capsule  0    BP 108/80  Pulse 85  Resp 18  Wt 193 lb 0.6 oz (87.562 kg)  SpO2 99%  LMP 07/13/2011       Objective:   Physical Exam  Constitutional: She appears well-developed and well-nourished.  Psychiatric: Her speech is normal and behavior is normal. Thought content normal. Her mood appears anxious. She does not express impulsivity. She expresses no homicidal ideation. She expresses no suicidal plans.       Tearful.            Assessment & Plan:

## 2012-03-06 NOTE — Assessment & Plan Note (Signed)
Exam is normal.  Likely irritated nerve due to attempt at IV access in ED.  Monitor, should improve on own.

## 2012-03-06 NOTE — Patient Instructions (Addendum)
Please follow up in 1 month, sooner if symptoms worsen or do not improve. Please contact [facility] to arrange counseling.

## 2012-03-22 ENCOUNTER — Encounter: Payer: Self-pay | Admitting: Internal Medicine

## 2012-03-23 ENCOUNTER — Encounter: Payer: Self-pay | Admitting: Internal Medicine

## 2012-03-23 ENCOUNTER — Ambulatory Visit (INDEPENDENT_AMBULATORY_CARE_PROVIDER_SITE_OTHER): Payer: BC Managed Care – PPO | Admitting: Internal Medicine

## 2012-03-23 VITALS — BP 130/90 | HR 84 | Ht 60.0 in | Wt 189.8 lb

## 2012-03-23 DIAGNOSIS — R110 Nausea: Secondary | ICD-10-CM

## 2012-03-23 DIAGNOSIS — K219 Gastro-esophageal reflux disease without esophagitis: Secondary | ICD-10-CM

## 2012-03-23 MED ORDER — OMEPRAZOLE 40 MG PO CPDR: 40.0000 mg | DELAYED_RELEASE_CAPSULE | Freq: Every day | ORAL | Status: DC

## 2012-03-23 NOTE — Progress Notes (Signed)
Patient ID: Chelsea Haley, female   DOB: 1975-07-04, 37 y.o.   MRN: [id number]  SUBJECTIVE: HPI Mrs. Vines is a 37 year old female with a past medical history of migraines, depression, hypertension, hyperlipidemia, GERD, endometriosis who is seen in consultation at the request of Dr. Peggyann Juba for evaluation of epigastric pain and pressure. The patient reports that she's had 2-3 weeks of upper abdominal/epigastric "pressure".  This worsened recently and thus she sought care. This pain seems to be worse after eating and does not radiate. She has noted some nausea but no vomiting. She has a history of heartburn, but the upper, pain was a different type pain. She was started on omeprazole 40 mg which has not been that helpful for either her heartburn or epigastric pain. She's been taking the omeprazole with food and has found that it seems to worsen her pain at times. No fevers or chills. She does use NSAIDs, specifically ibuprofen 800 mg but usually less than once per week. Bowel habits have been regular for her without bleeding or melena.  Review of Systems  As per history of present illness, otherwise negative   Past Medical History  Diagnosis Date  . History of chicken pox   . Depression   . Migraines   . History of kidney stones   . History of seizure disorder     as a child. No medication since age 50.  Marland Kitchen Hyperlipidemia   . Hypertension   . Tachycardia   . History of frequent urinary tract infections   . GERD (gastroesophageal reflux disease)   . Binge eating   . Endometriosis     Current Outpatient Prescriptions  Medication Sig Dispense Refill  . amitriptyline (ELAVIL) 25 MG tablet Take 1 tablet (25 mg total) by mouth at bedtime.  30 tablet  2  . Calcium Carbonate-Vitamin D (CALTRATE 600+D) 600-400 MG-UNIT per tablet Take 1 tablet by mouth 2 (two) times daily.      . cephALEXin (KEFLEX) 500 MG capsule One tablet by mouth 4 x daily for 1 week. Use back up birth control while on  antibiotics.  28 capsule  0  . eletriptan (RELPAX) 20 MG tablet One tablet by mouth at onset of headache. May repeat in 2 hours if headache persists or recurs. may repeat in 2 hours if necessary      . FLUoxetine (PROZAC) 40 MG capsule Take 1 capsule (40 mg total) by mouth daily.  30 capsule  0  . hydrochlorothiazide (HYDRODIURIL) 25 MG tablet Take 25 mg by mouth daily.      Marland Kitchen HYDROcodone-acetaminophen (NORCO/VICODIN) 5-325 MG per tablet 1-2 tablets po q 6 hours prn moderate to severe pain  20 tablet  0  . metoprolol succinate (TOPROL-XL) 25 MG 24 hr tablet Take 25 mg by mouth daily.      . Multiple Vitamin (MULTIVITAMIN) capsule Take 1 capsule by mouth daily.      . nitrofurantoin (MACRODANTIN) 50 MG capsule Take 50 mg by mouth daily.      . Norethindrone Acet-Ethinyl Est (MICROGESTIN 1.5/30 PO) Take 1 tablet by mouth daily.      Marland Kitchen omeprazole (PRILOSEC) 40 MG capsule Take 1 capsule (40 mg total) by mouth daily.  30 capsule  1  . ondansetron (ZOFRAN) 4 MG tablet Take 1 tablet (4 mg total) by mouth every 8 (eight) hours as needed for nausea.  20 tablet  0  . potassium chloride SA (K-DUR,KLOR-CON) 20 MEQ tablet Take 1 tablet (20 mEq total) by  mouth daily.  30 tablet  2  . silver sulfADIAZINE (SILVADENE) 1 % cream Apply 1 application topically as needed.        Allergies  Allergen Reactions  . Adhesive (Tape)     Skin sensitivity  . Banana     migraine  . Morphine And Related Itching    hallucinations  . Other Itching    WALNUTS.  Gums itch.  . Pertussis Vaccines     seizures  . Tramadol Anxiety    Family History  Problem Relation Age of Onset  . Arthritis Father   . Hypertension Father   . Heart disease Maternal Grandmother     ICD  . Cancer Maternal Grandfather     lung  . Diabetes Neg Hx     History  Substance Use Topics  . Smoking status: Former Smoker -- 1.0 packs/day for 20 years    Types: Cigarettes    Quit date: 10/19/2011  . Smokeless tobacco: Never Used      Comment: Electronic cigarette. Has cut down to 1 cigarette every 2 weeks.  . Alcohol Use: 0.5 oz/week    1 drink(s) per week    OBJECTIVE: BP 130/90  Pulse 84  Ht 5' (1.524 m)  Wt 189 lb 12.8 oz (86.093 kg)  BMI 37.07 kg/m2 Constitutional: Well-developed and well-nourished. No distress. HEENT: Normocephalic and atraumatic. Oropharynx is clear and moist. No oropharyngeal exudate. Conjunctivae are normal. No scleral icterus. Neck: Neck supple. Trachea midline. Cardiovascular: Normal rate, regular rhythm and intact distal pulses. No M/R/G Pulmonary/chest: Effort normal and breath sounds normal. No wheezing, rales or rhonchi. Abdominal: Soft, nontender, nondistended. Bowel sounds active throughout. There are no masses palpable. No hepatosplenomegaly. Extremities: no clubbing, cyanosis, or edema Lymphadenopathy: No cervical adenopathy noted. Neurological: Alert and oriented to person place and time. Skin: Skin is warm and dry. No rashes noted. Psychiatric: Normal mood and affect. Behavior is normal.  Labs and Imaging -- CBC    Component Value Date/Time   WBC 10.3 02/27/2012 2038   RBC 4.41 02/27/2012 2038   HGB 13.1 02/27/2012 2038   HCT 37.8 02/27/2012 2038   PLT 347 02/27/2012 2038   MCV 85.7 02/27/2012 2038   MCH 29.7 02/27/2012 2038   MCHC 34.7 02/27/2012 2038   RDW 12.2 02/27/2012 2038   LYMPHSABS 3.3 02/27/2012 2038   MONOABS 0.5 02/27/2012 2038   EOSABS 0.1 02/27/2012 2038   BASOSABS 0.1 02/27/2012 2038    CMP     Component Value Date/Time   NA 137 02/27/2012 2038   K 4.0 02/27/2012 2038   CL 104 02/27/2012 2038   CO2 22 02/27/2012 2038   GLUCOSE 89 02/27/2012 2038   BUN 9 02/27/2012 2038   CREATININE 0.70 02/27/2012 2038   CREATININE 0.74 11/04/2011 1327   CALCIUM 9.3 02/27/2012 2038   PROT 7.6 02/27/2012 2038   ALBUMIN 3.9 02/27/2012 2038   AST 16 02/27/2012 2038   ALT 8 02/27/2012 2038   ALKPHOS 25* 02/27/2012 2038   BILITOT 0.2* 02/27/2012 2038    GFRNONAA >90 02/27/2012 2038   GFRAA >90 02/27/2012 2038    Lipase     Component Value Date/Time   LIPASE 41 02/27/2012 2038   CT ABDOMEN AND PELVIS WITH CONTRAST 02/27/12   Technique:  Multidetector CT imaging of the abdomen and pelvis was performed following the standard protocol during bolus administration of intravenous contrast.   Contrast: OMNIPAQUE IOHEXOL 300 MG/ML  SOLN   Comparison: None.   Findings:  Respiratory motion degrades evaluation of the lung bases. Grossly clear.   The hepatic dome is excluded from the study. There are two hemangiomas within the right hepatic lobe measuring 2.5 cm and 3.2 cm.  There may be a smaller hemangioma within the left hepatic lobe on image 8.  Otherwise, homogeneous hepatic enhancement.   Unremarkable spleen, pancreas, adrenal glands.  Absent gallbladder. No biliary ductal dilatation.   Symmetric renal enhancement.  No hydronephrosis or hydroureter.   No bowel obstruction.  No CT evidence for colitis.  No free intraperitoneal air or fluid.  No lymphadenopathy.   Normal caliber aorta and branch vessels.  Mild scattered atherosclerosis.   Thin-walled bladder.  Unremarkable CT appearance to the uterus and adnexa.   No acute osseous finding.   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   No acute abdominopelvic process.   ASSESSMENT AND PLAN:  37 year old female with a past medical history of migraines, depression, hypertension, hyperlipidemia, GERD, endometriosis who is seen in consultation at the request of Dr. Peggyann Juba for evaluation of epigastric pain and pressure.   1.  Epigastric pain -- her symptoms have not responded to daily PPI therapy, then a CT scan and laboratory evaluation was unremarkable. I recommended upper endoscopy for further evaluation and to exclude gastritis duodenitis or ulcer disease. Given that she feels omeprazole worsens her symptoms, I would like to change her to pantoprazole  40 mg daily. She is instructed to take this 30 minutes to one hour before her first meal of the day. Further recommendations be made after endoscopy. She can continue to use Zofran as needed for nausea in the meantime.

## 2012-03-23 NOTE — Patient Instructions (Signed)
You have been scheduled for an endoscopy with propofol. Please follow written instructions given to you at your visit today. If you use inhalers (even only as needed) or a CPAP machine, please bring them with you on the day of your procedure.   We have sent the following medications to your pharmacy for you to pick up at your convenience: protonix; take 30 minutes prior to your first meal of the day

## 2012-03-29 ENCOUNTER — Encounter: Payer: Self-pay | Admitting: Internal Medicine

## 2012-03-29 ENCOUNTER — Ambulatory Visit (AMBULATORY_SURGERY_CENTER): Payer: BC Managed Care – PPO | Admitting: Internal Medicine

## 2012-03-29 VITALS — BP 136/76 | HR 75 | Temp 98.8°F | Resp 20 | Ht 60.0 in | Wt 187.0 lb

## 2012-03-29 DIAGNOSIS — K2990 Gastroduodenitis, unspecified, without bleeding: Secondary | ICD-10-CM

## 2012-03-29 DIAGNOSIS — R1013 Epigastric pain: Secondary | ICD-10-CM

## 2012-03-29 DIAGNOSIS — K219 Gastro-esophageal reflux disease without esophagitis: Secondary | ICD-10-CM

## 2012-03-29 DIAGNOSIS — K2970 Gastritis, unspecified, without bleeding: Secondary | ICD-10-CM

## 2012-03-29 DIAGNOSIS — R110 Nausea: Secondary | ICD-10-CM

## 2012-03-29 MED ORDER — SODIUM CHLORIDE 0.9 % IV SOLN: 500.0000 mL | INTRAVENOUS | Status: DC

## 2012-03-29 NOTE — Progress Notes (Signed)
Called to room to assist during endoscopic procedure.  Patient ID and intended procedure confirmed with present staff. Received instructions for my participation in the procedure from the performing physician.  

## 2012-03-29 NOTE — Progress Notes (Signed)
Lidocaine-40mg IV prior to Propofol InductionPropofol given over incremental dosages 

## 2012-03-29 NOTE — Op Note (Signed)
[facility] [address] [place], [address]   ENDOSCOPY PROCEDURE REPORT  PATIENT: Chelsea Haley, Chelsea Haley  MR#: [id number] BIRTHDATE: [birth year] , 36  yrs. old GENDER: Female ENDOSCOPIST: Beverley Fiedler, MD REFERRED BY:  Val EagleDaryel Gerald PROCEDURE DATE:  03/29/2012 PROCEDURE:  EGD w/ biopsy for H.pylori and EGD w/ biopsy ASA CLASS:     Class III INDICATIONS:  Epigastric pain.   Nausea. MEDICATIONS: MAC sedation, administered by CRNA and propofol (Diprivan) 250mg  IV TOPICAL ANESTHETIC: Cetacaine Spray  DESCRIPTION OF PROCEDURE: After the risks benefits and alternatives of the procedure were thoroughly explained, informed consent was obtained.  The LB [id number]-H180 [id number] endoscope was introduced through the mouth and advanced to the second portion of the duodenum. Without limitations.  The instrument was slowly withdrawn as the mucosa was fully examined.    ESOPHAGUS: An irregular Z-line was observed 35 cm from the incisors. Multiple biopsies were performed using cold forceps.  Sample sent for histology.   The esophagus was otherwise normal.  STOMACH: Mild gastritis (inflammation) was found in the gastric antrum.  Biopsies were taken in the antrum and angularis.  DUODENUM: The duodenal mucosa showed no abnormalities in the bulb and second portion of the duodenum. Retroflexed views revealed no abnormalities.     The scope was then withdrawn from the patient and the procedure completed.  COMPLICATIONS: There were no complications. ENDOSCOPIC IMPRESSION: 1.   Slightly irregular Z-line was observed 35 cm from the incisors; multiple biopsies to exclude Barrett's esophagus 2.   The esophagus was otherwise normal. 3.   Gastritis (inflammation) was found in the gastric antrum; biopsies were taken in the antrum and angularis 4.   The duodenal mucosa showed no abnormalities in the bulb and second portion of the duodenum  RECOMMENDATIONS: 1.  Await pathology  results 2.  Continue taking your PPI (pantoprazole) once daily.  It is best to be taken 20-30 minutes prior to breakfast meal. 3.  Follow-up of helicobacter pylori status, treat if indicated   eSigned:  Beverley Fiedler, MD 03/29/2012 12:27 PM [id number] [facility] Patient

## 2012-03-29 NOTE — Patient Instructions (Signed)
YOU HAD AN ENDOSCOPIC PROCEDURE TODAY AT THE [facility]: Refer to the procedure report that was given to you for any specific questions about what was found during the examination.  If the procedure report does not answer your questions, please call your gastroenterologist to clarify.  If you requested that your care partner not be given the details of your procedure findings, then the procedure report has been included in a sealed envelope for you to review at your convenience later.  YOU SHOULD EXPECT: Some feelings of bloating in the abdomen. Passage of more gas than usual.  Walking can help get rid of the air that was put into your GI tract during the procedure and reduce the bloating. If you had a lower endoscopy (such as a colonoscopy or flexible sigmoidoscopy) you may notice spotting of blood in your stool or on the toilet paper. If you underwent a bowel prep for your procedure, then you may not have a normal bowel movement for a few days.  DIET: Your first meal following the procedure should be a light meal and then it is ok to progress to your normal diet.  A half-sandwich or bowl of soup is an example of a good first meal.  Heavy or fried foods are harder to digest and may make you feel nauseous or bloated.  Likewise meals heavy in dairy and vegetables can cause extra gas to form and this can also increase the bloating.  Drink plenty of fluids but you should avoid alcoholic beverages for 24 hours.  ACTIVITY: Your care partner should take you home directly after the procedure.  You should plan to take it easy, moving slowly for the rest of the day.  You can resume normal activity the day after the procedure however you should NOT DRIVE or use heavy machinery for 24 hours (because of the sedation medicines used during the test).    SYMPTOMS TO REPORT IMMEDIATELY: A gastroenterologist can be reached at any hour.  During normal business hours, 8:30 AM to 5:00 PM Monday through Friday,  call [phone].  After hours and on weekends, please call the GI answering service at [phone] who will take a message and have the physician on call contact you.  Following upper endoscopy (EGD)  Vomiting of blood or coffee ground material  New chest pain or pain under the shoulder blades  Painful or persistently difficult swallowing  New shortness of breath  Fever of 100F or higher  Black, tarry-looking stools  FOLLOW UP: If any biopsies were taken you will be contacted by phone or by letter within the next 1-3 weeks.  Call your gastroenterologist if you have not heard about the biopsies in 3 weeks.  Our staff will call the home number listed on your records the next business day following your procedure to check on you and address any questions or concerns that you may have at that time regarding the information given to you following your procedure. This is a courtesy call and so if there is no answer at the home number and we have not heard from you through the emergency physician on call, we will assume that you have returned to your regular daily activities without incident.  SIGNATURES/CONFIDENTIALITY: You and/or your care partner have signed paperwork which will be entered into your electronic medical record.  These signatures attest to the fact that that the information above on your After Visit Summary has been reviewed and is understood.  Full responsibility of   the confidentiality of this discharge information lies with you and/or your care-partner. 

## 2012-03-29 NOTE — Progress Notes (Signed)
Patient did not experience any of the following events: a burn prior to discharge; a fall within the facility; wrong site/side/patient/procedure/implant event; or a hospital transfer or hospital admission upon discharge from the facility. (G8907) Patient did not have preoperative order for IV antibiotic SSI prophylaxis. (G8918)  

## 2012-03-30 ENCOUNTER — Telehealth: Payer: Self-pay | Admitting: *Deleted

## 2012-03-30 NOTE — Telephone Encounter (Signed)
  Follow up Call-  Call back number 03/29/2012  Post procedure Call Back phone  # [phone]  Permission to leave phone message Yes     Patient questions:  Do you have a fever, pain , or abdominal swelling? yes Pain Score  3 *  Have you tolerated food without any problems? yes  Have you been able to return to your normal activities? yes  Do you have any questions about your discharge instructions: Diet   no Medications  no Follow up visit  no  Do you have questions or concerns about your Care? no  Actions: * If pain score is 4 or above: No action needed, pain <4. Patient stating she has had 3/10 pain mid abd. Following the procedure. Describes it as pressure. Patient is still on prilosec at present , waiting for paycheck to pick up Protonix. Reviewed with patient how to take prilosec or protonix. Reviewed when to notify MD of symptoms such as severe pain,fever or vomiting of blood or coffee ground like material. Patient verbalized understanding and will pick up Protonix this weekend.

## 2012-04-02 ENCOUNTER — Encounter: Payer: Self-pay | Admitting: Family

## 2012-04-02 ENCOUNTER — Ambulatory Visit (INDEPENDENT_AMBULATORY_CARE_PROVIDER_SITE_OTHER): Payer: BC Managed Care – PPO | Admitting: Family

## 2012-04-02 ENCOUNTER — Telehealth: Payer: Self-pay | Admitting: Internal Medicine

## 2012-04-02 VITALS — BP 102/70 | HR 83 | Temp 98.4°F | Resp 16 | Ht 62.0 in | Wt 190.0 lb

## 2012-04-02 DIAGNOSIS — F418 Other specified anxiety disorders: Secondary | ICD-10-CM

## 2012-04-02 DIAGNOSIS — F341 Dysthymic disorder: Secondary | ICD-10-CM

## 2012-04-02 DIAGNOSIS — G43909 Migraine, unspecified, not intractable, without status migrainosus: Secondary | ICD-10-CM

## 2012-04-02 MED ORDER — PANTOPRAZOLE SODIUM 40 MG PO TBEC: 40.0000 mg | DELAYED_RELEASE_TABLET | Freq: Every day | ORAL | Status: DC

## 2012-04-02 MED ORDER — TOPIRAMATE 50 MG PO TABS: 50.0000 mg | ORAL_TABLET | Freq: Two times a day (BID) | ORAL | Status: DC

## 2012-04-02 MED ORDER — FLUOXETINE HCL 40 MG PO CAPS: 40.0000 mg | ORAL_CAPSULE | Freq: Every day | ORAL | Status: DC

## 2012-04-02 NOTE — Progress Notes (Signed)
Subjective:    Patient ID: Chelsea Haley, female    DOB: Aug 24, 1975, 37 y.o.   MRN: [id number]  HPI  [name] is a 37 yr old female who presents today for follow up of her depression.  Last visit her fluoxetine was increased from 20mg  to 40mg . She completed intake assessment at [facility].   Mood is pretty good, denies significant tearfulness.   She denies SI/HI. She is meeting with a new therapist.    Migraines- reports 4 migraines since 12/31.  She is interesting starting topamax.    Review of Systems See HPI  Past Medical History  Diagnosis Date  . History of chicken pox   . Depression   . Migraines   . History of kidney stones   . History of seizure disorder     as a child. No medication since age 85.  Marland Kitchen Hyperlipidemia   . Hypertension   . Tachycardia   . History of frequent urinary tract infections   . GERD (gastroesophageal reflux disease)   . Binge eating   . Endometriosis     History   Social History  . Marital Status: Married    Spouse Name: N/A    Number of Children: 1  . Years of Education: N/A   Occupational History  .      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 Smoking status: Current Every Day Smoker -- 20 years    Types: Cigarettes    Last Attempt to Quit: 10/19/2011  . Smokeless tobacco: Never Used     Comment: 1/2 to 1 ppd.  . Alcohol Use: 0.5 oz/week    1 drink(s) per week  . Drug Use: Not on file  . Sexually Active: Not on file   Other Topics Concern  . Not on file   Social History Narrative   Regular exercise:  NoCaffeine use:  NoLives with sonWorks at [facility]- developmental English/readingMasters in Education    Past Surgical History  Procedure Date  . Cholecystectomy 2006  . Cesarean section 2002  . Pilonidal cyst excision 2003  . Laporoscopy     Family History  Problem Relation Age of Onset  . Arthritis Father   . Hypertension Father   . Heart disease Maternal Grandmother     ICD  . Cancer Maternal  Grandfather     lung  . Diabetes Neg Hx     Allergies  Allergen Reactions  . Adhesive (Tape)     Skin sensitivity  . Banana     migraine  . Morphine And Related Itching    hallucinations  . Other Itching    WALNUTS.  Gums itch.  . Pertussis Vaccines     seizures  . Tramadol Anxiety    Current Outpatient Prescriptions on File Prior to Visit  Medication Sig Dispense Refill  . amitriptyline (ELAVIL) 25 MG tablet Take 1 tablet (25 mg total) by mouth at bedtime.  30 tablet  2  . Calcium Carbonate-Vitamin D (CALTRATE 600+D) 600-400 MG-UNIT per tablet Take 1 tablet by mouth 2 (two) times daily.      . cephALEXin (KEFLEX) 500 MG capsule One tablet by mouth 4 x daily for 1 week. Use back up birth control while on antibiotics.  28 capsule  0  . eletriptan (RELPAX) 20 MG tablet One tablet by mouth at onset of headache. May repeat in 2 hours if headache persists or recurs. may repeat in 2 hours if necessary      .  FLUoxetine (PROZAC) 40 MG capsule Take 1 capsule (40 mg total) by mouth daily.  30 capsule  0  . hydrochlorothiazide (HYDRODIURIL) 25 MG tablet Take 25 mg by mouth daily.      Marland Kitchen HYDROcodone-acetaminophen (NORCO/VICODIN) 5-325 MG per tablet 1-2 tablets po q 6 hours prn moderate to severe pain  20 tablet  0  . metoprolol succinate (TOPROL-XL) 25 MG 24 hr tablet Take 25 mg by mouth daily.      . Multiple Vitamin (MULTIVITAMIN) capsule Take 1 capsule by mouth daily.      . nitrofurantoin (MACRODANTIN) 50 MG capsule Take 50 mg by mouth daily.      . Norethindrone Acet-Ethinyl Est (MICROGESTIN 1.5/30 PO) Take 1 tablet by mouth daily.      . ondansetron (ZOFRAN) 4 MG tablet Take 1 tablet (4 mg total) by mouth every 8 (eight) hours as needed for nausea.  20 tablet  0  . Pantoprazole Sodium (PROTONIX PO) Take 1 tablet by mouth daily.      . potassium chloride SA (K-DUR,KLOR-CON) 20 MEQ tablet Take 1 tablet (20 mEq total) by mouth daily.  30 tablet  2  . silver sulfADIAZINE (SILVADENE) 1 %  cream Apply 1 application topically as needed.        BP 102/70  Pulse 83  Temp 98.4 F (36.9 C) (Oral)  Resp 16  Ht 5\' 2"  (1.575 m)  Wt 190 lb 0.6 oz (86.202 kg)  BMI 34.76 kg/m2  SpO2 99%       Objective:   Physical Exam  Constitutional: She appears well-developed and well-nourished. No distress.  HENT:  Head: Normocephalic and atraumatic.  Psychiatric: She has a normal mood and affect. Her behavior is normal. Judgment and thought content normal.          Assessment & Plan:

## 2012-04-02 NOTE — Assessment & Plan Note (Signed)
Improving, she is encouraged to continue to work with her therapist.  Continue increased dose of fluoxetine.

## 2012-04-02 NOTE — Telephone Encounter (Signed)
Spoke to pt.  Told her I was RESENDING protonix to her Pharmacy.

## 2012-04-02 NOTE — Patient Instructions (Addendum)
Topamax 50mg  (1/2 tab by mouth once daily for 1 week, then 1/2 tab twice daily for 1 week, then 50mg  daily in AM 25mg  in PM for 1 week, then continue 50mg  twice daily). Follow up in 6 weeks.

## 2012-04-02 NOTE — Assessment & Plan Note (Signed)
Unchanged.  Trial of topamax.

## 2012-04-04 ENCOUNTER — Encounter: Payer: Self-pay | Admitting: Internal Medicine

## 2012-04-08 ENCOUNTER — Encounter: Payer: Self-pay | Admitting: Family

## 2012-04-11 MED ORDER — HYDROCHLOROTHIAZIDE 25 MG PO TABS: 25.0000 mg | ORAL_TABLET | Freq: Every day | ORAL | Status: DC

## 2012-04-11 NOTE — Telephone Encounter (Signed)
Judeth Cornfield- please cancel her apt for 2/11. Thanks.

## 2012-04-24 ENCOUNTER — Ambulatory Visit: Payer: Self-pay | Admitting: Family

## 2012-05-14 ENCOUNTER — Ambulatory Visit: Payer: BC Managed Care – PPO | Admitting: Family

## 2012-05-16 ENCOUNTER — Encounter: Payer: Self-pay | Admitting: Family

## 2012-05-16 ENCOUNTER — Ambulatory Visit (INDEPENDENT_AMBULATORY_CARE_PROVIDER_SITE_OTHER): Payer: BC Managed Care – PPO | Admitting: Family

## 2012-05-16 VITALS — BP 100/70 | HR 70 | Temp 98.1°F | Resp 16 | Ht 62.0 in | Wt 192.0 lb

## 2012-05-16 DIAGNOSIS — G43909 Migraine, unspecified, not intractable, without status migrainosus: Secondary | ICD-10-CM

## 2012-05-16 DIAGNOSIS — F341 Dysthymic disorder: Secondary | ICD-10-CM

## 2012-05-16 DIAGNOSIS — H9190 Unspecified hearing loss, unspecified ear: Secondary | ICD-10-CM

## 2012-05-16 MED ORDER — METOPROLOL SUCCINATE ER 25 MG PO TB24: 25.0000 mg | ORAL_TABLET | Freq: Every day | ORAL | Status: DC

## 2012-05-16 MED ORDER — TOPIRAMATE 50 MG PO TABS: 50.0000 mg | ORAL_TABLET | Freq: Two times a day (BID) | ORAL | Status: DC

## 2012-05-16 NOTE — Assessment & Plan Note (Signed)
Improved on topamax. Continue same.

## 2012-05-16 NOTE — Patient Instructions (Addendum)
Please schedule a follow up appointment in 3 months. You will be contacted about your referral to the audiologist.   Please let us know if you have not heard back within 1 week about your referral.

## 2012-05-16 NOTE — Assessment & Plan Note (Signed)
Improved with counseling and fluoxetine.

## 2012-05-16 NOTE — Progress Notes (Signed)
Subjective:    Patient ID: Chelsea Haley, female    DOB: [birth year], 36 y.o.   MRN: [id number]  HPI  Chelsea Haley is a 37 yr old female here today for follow up.  1) Depression- fluoxetine was recently increased. She continues to follow with a therapist. She is following with a new therapist close to home. She meets with her every other Wednesday.  2) Migraine- last visit she was started on topamax. Notes some tingling in her hands/feet.  This is not bothering her enough to stop the topamax.  She reports improvement in the severity and frequency of her headaches.  3) Hearing problem- reports that her hearing is worse with crowds and loud places.  She reports that her sister and dad both have hearing loss. She also has a female cousin with hearing loss.  Review of Systems See HPI  Past Medical History  Diagnosis Date  . History of chicken pox   . Depression   . Migraines   . History of kidney stones   . History of seizure disorder     as a child. No medication since age 39.  Marland Kitchen Hyperlipidemia   . Hypertension   . Tachycardia   . History of frequent urinary tract infections   . GERD (gastroesophageal reflux disease)   . Binge eating   . Endometriosis     History   Social History  . Marital Status: Married    Spouse Name: N/A    Number of Children: 1  . Years of Education: N/A   Occupational History  .      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 Smoking status: Current Every Day Smoker -- 20 years    Types: Cigarettes    Last Attempt to Quit: 10/19/2011  . Smokeless tobacco: Never Used     Comment: 1/2 to 1 ppd.  . Alcohol Use: 0.5 oz/week    1 drink(s) per week  . Drug Use: Not on file  . Sexually Active: Not on file   Other Topics Concern  . 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 Cholecystectomy  2006  . Cesarean section  2002  . Pilonidal cyst excision  2003  . Laporoscopy      Family History  Problem Relation Age of Onset  . Arthritis Father   . Hypertension Father   . Heart disease Maternal Grandmother     ICD  . Cancer Maternal Grandfather     lung  . Diabetes Neg Hx     Allergies  Allergen Reactions  . Adhesive (Tape)     Skin sensitivity  . Banana     migraine  . Morphine And Related Itching    hallucinations  . Other Itching    WALNUTS.  Gums itch.  . Pertussis Vaccines     seizures  . Tramadol Anxiety    Current Outpatient Prescriptions on File Prior to Visit  Medication Sig Dispense Refill  . Calcium Carbonate-Vitamin D (CALTRATE 600+D) 600-400 MG-UNIT per tablet Take 1 tablet by mouth 2 (two) times daily.      Marland Kitchen FLUoxetine (PROZAC) 40 MG capsule Take 1 capsule (40 mg total) by  mouth daily.  30 capsule  5  . hydrochlorothiazide (HYDRODIURIL) 25 MG tablet Take 1 tablet (25 mg total) by mouth daily.  30 tablet  5  . HYDROcodone-acetaminophen (NORCO/VICODIN) 5-325 MG per tablet 1-2 tablets po q 6 hours prn moderate to severe pain  20 tablet  0  . metoprolol succinate (TOPROL-XL) 25 MG 24 hr tablet Take 25 mg by mouth daily.      . Multiple Vitamin (MULTIVITAMIN) capsule Take 1 capsule by mouth daily.      . nitrofurantoin (MACRODANTIN) 50 MG capsule Take 50 mg by mouth daily.      . ondansetron (ZOFRAN) 4 MG tablet Take 1 tablet (4 mg total) by mouth every 8 (eight) hours as needed for nausea.  20 tablet  0  . pantoprazole (PROTONIX) 40 MG tablet Take 1 tablet (40 mg total) by mouth daily.  90 tablet  3  . potassium chloride SA (K-DUR,KLOR-CON) 20 MEQ tablet Take 1 tablet (20 mEq total) by mouth daily.  30 tablet  2  . silver sulfADIAZINE (SILVADENE) 1 % cream Apply 1 application topically as needed.      . topiramate (TOPAMAX) 50 MG tablet Take 1 tablet (50 mg total) by mouth 2 (two) times  daily.  60 tablet  1  . Norethindrone Acet-Ethinyl Est (MICROGESTIN 1.5/30 PO) Take 1 tablet by mouth daily.       No current facility-administered medications on file prior to visit.    BP 100/70  Pulse 70  Temp(Src) 98.1 F (36.7 C) (Oral)  Resp 16  Ht 5\' 2"  (1.575 m)  Wt 192 lb 0.6 oz (87.109 kg)  BMI 35.12 kg/m2  SpO2 98%       Objective:   Physical Exam  Constitutional: She appears well-developed and well-nourished. No distress.  HENT:  Head: Normocephalic and atraumatic.  Small amount of cerumen in right ear canal, removed with curette.   Cardiovascular: Normal rate and regular rhythm.   No murmur heard. Pulmonary/Chest: Effort normal and breath sounds normal. No respiratory distress. She has no wheezes. She has no rales. She exhibits no tenderness.  Musculoskeletal: She exhibits no edema.  Psychiatric: She has a normal mood and affect. Her behavior is normal. Judgment and thought content normal.          Assessment & Plan:

## 2012-05-16 NOTE — Assessment & Plan Note (Signed)
Refer to audiology for eval.

## 2012-06-11 ENCOUNTER — Telehealth: Payer: Self-pay | Admitting: *Deleted

## 2012-06-11 NOTE — Telephone Encounter (Signed)
Pt states her husband and herself have decided to conceive. Pt states she was informed to give the office a call when she made that decision due to medical conditions. Pt states there are appointments that may need to be made.

## 2012-06-11 NOTE — Telephone Encounter (Signed)
Pt would like to know if you would like her to make an appointment with you. Pt states at her last appointment you discussed when she was ready to conceive she would need to set up a telemedicine appointment with [facility] and/or an appointment with a reproductive endocrinologist.

## 2012-06-13 NOTE — Telephone Encounter (Signed)
Pt contacted to come by the office to sign a release of records for GI specialist. Pt also offered to a preconception appointment with MFM. Scarlette Calico to call patient tomorrow with appointment date and time.

## 2012-06-13 NOTE — Telephone Encounter (Signed)
Call pt.  Need records from GI re: liver hemangioma.  Offer to schedule preconception visit w/MFM.

## 2012-06-14 NOTE — Telephone Encounter (Signed)
SENT A REQUEST TO [facility] FOR AN PRE-CONCEPTION CONSULT AWAITING FOR AN APPOINT.

## 2012-06-22 ENCOUNTER — Encounter: Payer: Self-pay | Admitting: Obstetrics & Gynecology

## 2012-07-04 ENCOUNTER — Ambulatory Visit: Payer: BC Managed Care – PPO | Attending: Otolaryngology | Admitting: Audiology

## 2012-07-04 DIAGNOSIS — H93299 Other abnormal auditory perceptions, unspecified ear: Secondary | ICD-10-CM | POA: Insufficient documentation

## 2012-07-17 ENCOUNTER — Encounter: Payer: Self-pay | Admitting: Family

## 2012-07-17 ENCOUNTER — Telehealth: Payer: Self-pay | Admitting: Internal Medicine

## 2012-07-17 ENCOUNTER — Telehealth: Payer: Self-pay | Admitting: Cardiology

## 2012-07-17 ENCOUNTER — Institutional Professional Consult (permissible substitution): Payer: BC Managed Care – PPO

## 2012-07-17 DIAGNOSIS — Z8742 Personal history of other diseases of the female genital tract: Secondary | ICD-10-CM | POA: Insufficient documentation

## 2012-07-17 DIAGNOSIS — Z3009 Encounter for other general counseling and advice on contraception: Secondary | ICD-10-CM | POA: Insufficient documentation

## 2012-07-17 DIAGNOSIS — I10 Essential (primary) hypertension: Secondary | ICD-10-CM | POA: Insufficient documentation

## 2012-07-17 NOTE — Telephone Encounter (Signed)
Will make dr crenshaw aware 

## 2012-07-17 NOTE — Telephone Encounter (Signed)
Thank her for letting us know Hepatic hemangiomas can increase in size during pregnancy, but hemangiomas less than 10 cm very uncommonly lead to any complications for either mother or baby Her largest hemangioma was 3 cm, which is relatively very small. If she develops any RUQ pain, or other concerning symptoms she can let us know and we will be happy to see her.

## 2012-07-17 NOTE — Telephone Encounter (Signed)
Pt states she had a "virtual appt" today with [facility] fertility specialist. Per pt she was given the ok for her an her husband to try and conceive. Pt states she has a couple of small hemangiomas and [facility] wanted her to be sure and let Dr. Rhea Belton know of her plans to have a baby. With pregnancy they told her the change in hormones could make the hemangiomas grow. Fertility doc did not see a problem with this. Dr. Rhea Belton notified.

## 2012-07-17 NOTE — Telephone Encounter (Signed)
New problem   Pt trying to conceive and was told by her ob/gyn to call and inform dr Jens Som

## 2012-07-18 NOTE — Telephone Encounter (Signed)
Spoke with pt, Aware of dr crenshaw's recommendations.  °

## 2012-07-18 NOTE — Telephone Encounter (Signed)
Ok from a cardiac standpoint; needs to review with her primary care and OB Olga Millers

## 2012-07-18 NOTE — Telephone Encounter (Signed)
Spoke with pt and she is aware.

## 2012-07-23 ENCOUNTER — Ambulatory Visit (INDEPENDENT_AMBULATORY_CARE_PROVIDER_SITE_OTHER): Payer: BC Managed Care – PPO | Admitting: Family

## 2012-07-23 ENCOUNTER — Encounter: Payer: Self-pay | Admitting: Family

## 2012-07-23 VITALS — BP 100/80 | HR 67 | Temp 98.6°F | Resp 16 | Wt 195.0 lb

## 2012-07-23 DIAGNOSIS — Z3169 Encounter for other general counseling and advice on procreation: Secondary | ICD-10-CM

## 2012-07-23 MED ORDER — FLUOXETINE HCL 20 MG PO CAPS: ORAL_CAPSULE | ORAL | Status: DC

## 2012-07-23 NOTE — Assessment & Plan Note (Addendum)
We discussed that topamax is a category D. Plan cut topamax in half and take one half tablet twice daily x 1 week then stop. Decrease prozac from 40mg   20mg  once daily for 2 weeks then every other day for 1 week then stop. We will see how she does off of medication.  If absolutely necessary, we discussed that we may need to weigh risk/benefits and consider restarting Try to avoid zomig unless absolutely necessary.  Stop hydrocodone.  Follow up in 6 weeks.  In regards to  Caltrate- can decrease to 1 tab daily as she is taking a prenatal with calcium and vitamin D.   20 minutes spent with pt today. >50% of this time was spent counseling pt on medication safety as it relates to conception/pregnancy.

## 2012-07-23 NOTE — Patient Instructions (Addendum)
Please cut topamax in half and take one half tablet twice daily x 1 week then stop. Start prozac 20mg  once daily for 2 weeks then every other day for 1 week then stop. Try to avoid zomig unless absolutely necessary.  Stop hydrocodone.  Follow up in 6 weeks.

## 2012-07-23 NOTE — Progress Notes (Signed)
Subjective:    Patient ID: Chelsea Haley, female    DOB: 1975-09-13, 37 y.o.   MRN: [id number]  HPI  Chelsea Haley is a 37 yr old female who presents today to discuss medications safety prior to conception. She is accompanied today by her husband.  She met with Dr. Tamela Oddi her OB who arranged an virtual visit with a fetal medicine Specialist at [facility].  The pt has a history of seizure disorder, but is currently using topamax for migraine prophylaxis.  She spoke with her neurologist who recommended that she taper off of topamax. Her neurologist felt that the greatest fetal risk of topamax was in the early months of pregnancy and if this needed to be added back in later in the pregnacy that is a possibility.  Pt reports hx of SVT for which she uses Toprol XL. She is on a low dose and the fetal specialist felt that her low dose was probably OK to continue.  Edema- she reports that she continues to use hctz due to bad water retention.  Depression- she has been on prozac for 2 years.  Depression currently well controlled.  Tobacco abuse- pt is using e-cigarettes to help her quit smoking.  Dr. Tamela Oddi (had virtual visit at [facility]) Had visit with fetal medicine specialist.       Review of Systems     Past Medical History  Diagnosis Date  . History of chicken pox   . Depression   . Migraines   . History of kidney stones   . History of seizure disorder     as a child. No medication since age 59.  Marland Kitchen Hyperlipidemia   . Hypertension   . Tachycardia   . History of frequent urinary tract infections   . GERD (gastroesophageal reflux disease)   . Binge eating   . Endometriosis     History   Social History  . Marital Status: Married    Spouse Name: N/A    Number of Children: 1  . Years of Education: N/A   Occupational History  .      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 Smoking status: Current Every Day Smoker -- 20 years    Types: Cigarettes     Last Attempt to Quit: 10/19/2011  . Smokeless tobacco: Never Used     Comment: Using electronic cigarette.  . Alcohol Use: 0.5 oz/week    1 drink(s) per week  . Drug Use: Not on file  . Sexually Active: Not on file   Other Topics Concern  . 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 Cholecystectomy  2006  . Cesarean section  2002  . Pilonidal cyst excision  2003  . Laporoscopy      Family History  Problem Relation Age of Onset  . Arthritis Father   . Hypertension Father   . Heart disease Maternal Grandmother     ICD  . Cancer Maternal Grandfather     lung  . Diabetes Neg Hx     Allergies  Allergen Reactions  . Adhesive (Tape)     Skin sensitivity  . Banana     migraine  .  Morphine And Related Itching    hallucinations  . Other Itching    WALNUTS.  Gums itch.  . Pertussis Vaccines     seizures  . Tramadol Anxiety    Current Outpatient Prescriptions on File Prior to Visit  Medication Sig Dispense Refill  . Calcium Carbonate-Vitamin D (CALTRATE 600+D) 600-400 MG-UNIT per tablet Take 1 tablet by mouth 2 (two) times daily.      . hydrochlorothiazide (HYDRODIURIL) 25 MG tablet Take 1 tablet (25 mg total) by mouth daily.  30 tablet  5  . metoprolol succinate (TOPROL-XL) 25 MG 24 hr tablet Take 1 tablet (25 mg total) by mouth daily.  30 tablet  5  . nitrofurantoin (MACRODANTIN) 50 MG capsule Take 50 mg by mouth daily.      . ondansetron (ZOFRAN) 4 MG tablet Take 1 tablet (4 mg total) by mouth every 8 (eight) hours as needed for nausea.  20 tablet  0  . pantoprazole (PROTONIX) 40 MG tablet Take 1 tablet (40 mg total) by mouth daily.  90 tablet  3  . potassium chloride SA (K-DUR,KLOR-CON) 20 MEQ tablet Take 1 tablet (20 mEq total) by mouth daily.  30 tablet  2  .  silver sulfADIAZINE (SILVADENE) 1 % cream Apply 1 application topically as needed.      . zolmitriptan (ZOMIG) 5 MG nasal solution Place 1 spray into the nose as needed for migraine.       No current facility-administered medications on file prior to visit.    BP 100/80  Pulse 67  Temp(Src) 98.6 F (37 C) (Oral)  Resp 16  Wt 195 lb (88.451 kg)  BMI 35.66 kg/m2  SpO2 99%  LMP 07/21/2012    Objective:   Physical Exam  Constitutional: She is oriented to person, place, and time. She appears well-developed and well-nourished. No distress.  HENT:  Head: Normocephalic and atraumatic.  Cardiovascular:  No murmur heard. Musculoskeletal: She exhibits no edema.  Neurological: She is alert and oriented to person, place, and time.  Psychiatric: She has a normal mood and affect. Her behavior is normal. Judgment and thought content normal.          Assessment & Plan:

## 2012-07-24 ENCOUNTER — Encounter: Payer: Self-pay | Admitting: Obstetrics & Gynecology

## 2012-07-30 ENCOUNTER — Telehealth: Payer: Self-pay | Admitting: Family

## 2012-07-30 NOTE — Telephone Encounter (Signed)
Message copied by Sandford Craze on Mon Jul 30, 2012  5:46 AM ------      Message from: [place], [place]      Created: Sun Jul 29, 2012  8:40 PM       Othniel Maret,            Toprol should be fine in pregnancy.            Antionette Char      ----- Message -----         From: Sandford Craze, NP         Sent: 07/23/2012  12:05 PM           To: Antionette Char, MD            Hello,            I am Ms. Chelsea Haley PCP.  She is Toprol for hx of SVT.  Do you prefer labetalol from your standpoint?  Although these meds are both cat C I know that GYN's tend to favor labetalol during pregnancy.  If so, I will check with her cardiologist to see if if thinks that labetalol would be an appropriate switch for her.            Thanks,            Nelvin Tomb        ------

## 2012-08-15 ENCOUNTER — Ambulatory Visit: Payer: BC Managed Care – PPO | Admitting: Family

## 2012-08-28 ENCOUNTER — Encounter: Payer: Self-pay | Admitting: Obstetrics & Gynecology

## 2012-09-03 ENCOUNTER — Ambulatory Visit (INDEPENDENT_AMBULATORY_CARE_PROVIDER_SITE_OTHER): Payer: BC Managed Care – PPO | Admitting: Family

## 2012-09-03 ENCOUNTER — Encounter: Payer: Self-pay | Admitting: Family

## 2012-09-03 VITALS — BP 100/78 | HR 77 | Temp 98.8°F | Resp 16 | Ht 62.0 in | Wt 198.0 lb

## 2012-09-03 DIAGNOSIS — F418 Other specified anxiety disorders: Secondary | ICD-10-CM

## 2012-09-03 DIAGNOSIS — G43909 Migraine, unspecified, not intractable, without status migrainosus: Secondary | ICD-10-CM

## 2012-09-03 DIAGNOSIS — F341 Dysthymic disorder: Secondary | ICD-10-CM

## 2012-09-03 DIAGNOSIS — L089 Local infection of the skin and subcutaneous tissue, unspecified: Secondary | ICD-10-CM

## 2012-09-03 MED ORDER — FLUOXETINE HCL 20 MG PO TABS: ORAL_TABLET | ORAL | Status: DC

## 2012-09-03 MED ORDER — CEPHALEXIN 500 MG PO CAPS: 500.0000 mg | ORAL_CAPSULE | Freq: Two times a day (BID) | ORAL | Status: DC

## 2012-09-03 NOTE — Assessment & Plan Note (Signed)
Overall have been stable off of topamax.  Monitor.

## 2012-09-03 NOTE — Progress Notes (Signed)
Subjective:    Patient ID: Chelsea Haley, female    DOB: [birth year], 37 y.o.   MRN: [id number]  HPI  Chelsea Haley is 37 yr old female who presents today for follow up.  1) Depression- she is on prozac 20 mg every other day.  She is seeing Dr. Melodie Bouillon.  She has not tried to cut down any further on her prozac.    2) Migraine- Overall migraines have been ok off of the topamax.  Today she had an occular migraine.    3) Otalgia- reports L ear pain x 2 days. Has been swimming, "feels like pressure."    4) reports "bump on hip."  Has been present x 1 week, getting bigger.     Review of Systems    see HPI  Past Medical History  Diagnosis Date  . History of chicken pox   . Depression   . Migraines   . History of kidney stones   . History of seizure disorder     as a child. No medication since age 53.  Marland Kitchen Hyperlipidemia   . Hypertension   . Tachycardia   . History of frequent urinary tract infections   . GERD (gastroesophageal reflux disease)   . Binge eating   . Endometriosis     History   Social History  . Marital Status: Married    Spouse Name: N/A    Number of Children: 1  . Years of Education: N/A   Occupational History  .      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 Smoking status: Current Every Day Smoker -- 20 years    Types: Cigarettes    Last Attempt to Quit: 10/19/2011  . Smokeless tobacco: Never Used     Comment: Using electronic cigarette.  . Alcohol Use: 0.5 oz/week    1 drink(s) per week  . Drug Use: Not on file  . Sexually Active: Not on file   Other Topics Concern  . 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 Cholecystectomy  2006  . Cesarean section  2002  . Pilonidal cyst excision  2003  . Laporoscopy      Family  History  Problem Relation Age of Onset  . Arthritis Father   . Hypertension Father   . Heart disease Maternal Grandmother     ICD  . Cancer Maternal Grandfather     lung  . Diabetes Neg Hx     Allergies  Allergen Reactions  . Adhesive (Tape)     Skin sensitivity  . Banana     migraine  . Morphine And Related Itching    hallucinations  . Other Itching    WALNUTS.  Gums itch.  . Pertussis Vaccines     seizures  . Tramadol Anxiety    Current Outpatient Prescriptions on File Prior to Visit  Medication Sig Dispense Refill  . Calcium Carbonate-Vitamin D (CALTRATE 600+D) 600-400 MG-UNIT per tablet Take 1 tablet by mouth daily.       . hydrochlorothiazide (HYDRODIURIL) 25 MG tablet Take 1 tablet (25 mg total) by mouth daily.  30 tablet  5  . metoprolol succinate (TOPROL-XL) 25 MG 24 hr  tablet Take 1 tablet (25 mg total) by mouth daily.  30 tablet  5  . nitrofurantoin (MACRODANTIN) 50 MG capsule Take 50 mg by mouth daily.      . ondansetron (ZOFRAN) 4 MG tablet Take 1 tablet (4 mg total) by mouth every 8 (eight) hours as needed for nausea.  20 tablet  0  . pantoprazole (PROTONIX) 40 MG tablet Take 1 tablet (40 mg total) by mouth daily.  90 tablet  3  . potassium chloride SA (K-DUR,KLOR-CON) 20 MEQ tablet Take 1 tablet (20 mEq total) by mouth daily.  30 tablet  2  . Prenatal Vit-Fe Sulfate-FA (PRENATAL VITAMIN PO) Take 1 tablet by mouth daily.      . silver sulfADIAZINE (SILVADENE) 1 % cream Apply 1 application topically as needed.      . zolmitriptan (ZOMIG) 5 MG nasal solution Place 1 spray into the nose as needed for migraine.       No current facility-administered medications on file prior to visit.    BP 100/78  Pulse 77  Temp(Src) 98.8 F (37.1 C) (Oral)  Resp 16  Ht 5\' 2"  (1.575 m)  Wt 198 lb (89.812 kg)  BMI 36.21 kg/m2  SpO2 99%    Objective:   Physical Exam  Constitutional: She appears well-developed and well-nourished. No distress.  HENT:  Right Ear:  Tympanic membrane and ear canal normal.  Left Ear: Tympanic membrane and ear canal normal.  Cardiovascular: Normal rate and regular rhythm.   No murmur heard. Pulmonary/Chest: Effort normal and breath sounds normal. No respiratory distress. She has no wheezes. She has no rales. She exhibits no tenderness.  Musculoskeletal: She exhibits no edema.  Skin:  + tender indurated pea sized area on the right hip  Psychiatric: She has a normal mood and affect. Her behavior is normal. Judgment and thought content normal.          Assessment & Plan:

## 2012-09-03 NOTE — Assessment & Plan Note (Signed)
Mild, right hip. Will rx with keflex.

## 2012-09-03 NOTE — Assessment & Plan Note (Signed)
Stable. Actively trying to conceive. Will try tapering her off of her prozac as outlined in AVS. She understands that she is to contact me if her depression symptoms worsen off of the prozac.

## 2012-09-03 NOTE — Patient Instructions (Signed)
Taper off of prozac. Call if depression symptoms worsen. Follow up in 3 months.

## 2012-09-08 ENCOUNTER — Encounter: Payer: Self-pay | Admitting: Family

## 2012-09-11 ENCOUNTER — Telehealth: Payer: Self-pay | Admitting: *Deleted

## 2012-09-11 NOTE — Telephone Encounter (Signed)
Pt called stating her vaginal burning seems to be some better but notes a constant soreness externally. Has noted some blood on toilet tissue when she wipes  Symptom is relieved by lying down. Has been applying monistat externally without improvement of soreness.  Wants to know what else she should do?  Please advise.

## 2012-09-11 NOTE — Telephone Encounter (Signed)
See my chart message

## 2012-09-11 NOTE — Telephone Encounter (Signed)
She should be evaluated in the office.

## 2012-09-12 ENCOUNTER — Encounter: Payer: Self-pay | Admitting: Family

## 2012-09-12 ENCOUNTER — Ambulatory Visit (INDEPENDENT_AMBULATORY_CARE_PROVIDER_SITE_OTHER): Payer: BC Managed Care – PPO | Admitting: Family

## 2012-09-12 VITALS — BP 120/80 | HR 86 | Temp 98.2°F | Resp 16 | Wt 201.1 lb

## 2012-09-12 DIAGNOSIS — N760 Acute vaginitis: Secondary | ICD-10-CM

## 2012-09-12 NOTE — Assessment & Plan Note (Signed)
Wet prep performed.  Due to pain, I will also have her complete HSV 1 and 2 antibody testing.  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

## 2012-09-12 NOTE — Addendum Note (Signed)
Addended by: Mervin Kung A on: 09/12/2012 03:57 PM   Modules accepted: Orders

## 2012-09-12 NOTE — Patient Instructions (Addendum)
Please complete your lab work prior to leaving.  You may use vagisil or tuck pads as needed for discomfort. We will contact you with your results.

## 2012-09-12 NOTE — Progress Notes (Signed)
Subjective:    Patient ID: Chelsea Haley, female    DOB: [birth year], 37 y.o.   MRN: [id number]  HPI  Chelsea Haley is a 37 yr old female who presents today with chief complaint of vaginal discomfort.  She did the one day treatment monistat on Friday. By Sunday itching/burning was better.  Sunday night, Monday/tuesday she has "been in pain."  Reports that the pain is in the clitoral area.   Review of Systems    see HPI  Past Medical History  Diagnosis Date  . History of chicken pox   . Depression   . Migraines   . History of kidney stones   . History of seizure disorder     as a child. No medication since age 28.  Marland Kitchen Hyperlipidemia   . Hypertension   . Tachycardia   . History of frequent urinary tract infections   . GERD (gastroesophageal reflux disease)   . Binge eating   . Endometriosis     History   Social History  . Marital Status: Married    Spouse Name: N/A    Number of Children: 1  . Years of Education: N/A   Occupational History  .      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 Smoking status: Current Every Day Smoker -- 20 years    Types: Cigarettes    Last Attempt to Quit: 10/19/2011  . Smokeless tobacco: Never Used     Comment: Using electronic cigarette.  . Alcohol Use: 0.5 oz/week    1 drink(s) per week  . Drug Use: Not on file  . Sexually Active: Not on file   Other Topics Concern  . 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 Cholecystectomy  2006  . Cesarean section  2002  . Pilonidal cyst excision  2003  . Laporoscopy      Family History  Problem Relation Age of Onset  . Arthritis Father   . Hypertension Father   . Heart disease Maternal Grandmother     ICD  . Cancer Maternal Grandfather     lung  . Diabetes Neg Hx      Allergies  Allergen Reactions  . Adhesive (Tape)     Skin sensitivity  . Banana     migraine  . Morphine And Related Itching    hallucinations  . Other Itching    WALNUTS.  Gums itch.  . Pertussis Vaccines     seizures  . Tramadol Anxiety    Current Outpatient Prescriptions on File Prior to Visit  Medication Sig Dispense Refill  . Calcium Carbonate-Vitamin D (CALTRATE 600+D) 600-400 MG-UNIT per tablet Take 1 tablet by mouth daily.       Marland Kitchen FLUoxetine (PROZAC) 20 MG tablet 1/2 tab daily for 1 week, then every other day for 1 week then stop.  14 tablet  0  . hydrochlorothiazide (HYDRODIURIL) 25 MG tablet Take 1 tablet (25 mg total) by mouth daily.  30 tablet  5  . metoprolol succinate (TOPROL-XL) 25 MG 24 hr tablet Take 1 tablet (25 mg total) by mouth daily.  30 tablet  5  . nitrofurantoin (MACRODANTIN) 50 MG capsule Take 50  mg by mouth daily.      . ondansetron (ZOFRAN) 4 MG tablet Take 1 tablet (4 mg total) by mouth every 8 (eight) hours as needed for nausea.  20 tablet  0  . pantoprazole (PROTONIX) 40 MG tablet Take 1 tablet (40 mg total) by mouth daily.  90 tablet  3  . potassium chloride SA (K-DUR,KLOR-CON) 20 MEQ tablet Take 1 tablet (20 mEq total) by mouth daily.  30 tablet  2  . Prenatal Vit-Fe Sulfate-FA (PRENATAL VITAMIN PO) Take 1 tablet by mouth daily.      . silver sulfADIAZINE (SILVADENE) 1 % cream Apply 1 application topically as needed.      . zolmitriptan (ZOMIG) 5 MG nasal solution Place 1 spray into the nose as needed for migraine.       No current facility-administered medications on file prior to visit.    BP 120/80  Pulse 86  Temp(Src) 98.2 F (36.8 C) (Oral)  Resp 16  Wt 201 lb 1.9 oz (91.227 kg)  BMI 36.78 kg/m2  SpO2 98%  LMP 08/25/2012    Objective:   Physical Exam  Constitutional: She is oriented to person, place, and time. She appears well-developed and well-nourished. No distress.  Genitourinary:  Some white vaginal discharge is noted.   No ulcerations or swelling noted but she is noted to have tenderness to touch of labia.    Neurological: She is alert and oriented to person, place, and time.          Assessment & Plan:

## 2012-09-13 ENCOUNTER — Encounter: Payer: Self-pay | Admitting: Family

## 2012-09-13 ENCOUNTER — Other Ambulatory Visit: Payer: Self-pay | Admitting: Family

## 2012-09-13 LAB — HSV 1 ANTIBODY, IGG: HSV 1 Glycoprotein G Ab, IgG: 7.44 IV — ABNORMAL HIGH

## 2012-09-13 LAB — WET PREP BY MOLECULAR PROBE: Candida species: NEGATIVE

## 2012-09-13 LAB — HSV(HERPES SIMPLEX VRS) I + II AB-IGM: Herpes Simplex Vrs I&II-IgM Ab (EIA): 2.1 INDEX — ABNORMAL HIGH

## 2012-09-13 MED ORDER — METRONIDAZOLE 500 MG PO TABS: 500.0000 mg | ORAL_TABLET | Freq: Two times a day (BID) | ORAL | Status: DC

## 2012-11-13 ENCOUNTER — Encounter: Payer: Self-pay | Admitting: Family

## 2012-11-14 ENCOUNTER — Ambulatory Visit (HOSPITAL_BASED_OUTPATIENT_CLINIC_OR_DEPARTMENT_OTHER)
Admission: RE | Admit: 2012-11-14 | Discharge: 2012-11-14 | Disposition: A | Discharge status: Home / Self Care | Payer: BC Managed Care – PPO | Source: Ambulatory Visit | Attending: Family | Admitting: Family

## 2012-11-14 ENCOUNTER — Ambulatory Visit (INDEPENDENT_AMBULATORY_CARE_PROVIDER_SITE_OTHER): Payer: BC Managed Care – PPO | Admitting: Family

## 2012-11-14 ENCOUNTER — Encounter: Payer: Self-pay | Admitting: Family

## 2012-11-14 VITALS — BP 110/70 | HR 68 | Temp 98.3°F | Resp 18 | Wt 206.0 lb

## 2012-11-14 DIAGNOSIS — M7989 Other specified soft tissue disorders: Secondary | ICD-10-CM

## 2012-11-14 DIAGNOSIS — I10 Essential (primary) hypertension: Secondary | ICD-10-CM | POA: Insufficient documentation

## 2012-11-14 DIAGNOSIS — J309 Allergic rhinitis, unspecified: Secondary | ICD-10-CM | POA: Insufficient documentation

## 2012-11-14 DIAGNOSIS — R600 Localized edema: Secondary | ICD-10-CM | POA: Insufficient documentation

## 2012-11-14 DIAGNOSIS — R609 Edema, unspecified: Secondary | ICD-10-CM

## 2012-11-14 MED ORDER — BUDESONIDE 32 MCG/ACT NA SUSP: 2.0000 | Freq: Every day | NASAL | Status: DC

## 2012-11-14 NOTE — Progress Notes (Signed)
Subjective:    Patient ID: Chelsea Haley, female    DOB: 1975/07/24, 37 y.o.   MRN: [id number]  HPI  Ms. Ury is a 37 yr old female who presents today with chief complaint of left foot swellling.  Started after a long walk 10 days ago.  Noted on Saturday she went on a hike and it was swollen when she got back.  Notes mild swelling of the right foot.  She denies calf pain. She does not some pain in the left achilles area.  She did have some back pain on Sunday and Monday but this resolved.  Denies long car trip. Denies SOB or CP.    Reports + nasal congestion since Thursday.  Denies fever.    Review of Systems    see HPI  Past Medical History  Diagnosis Date  . History of chicken pox   . Depression   . Migraines   . History of kidney stones   . History of seizure disorder     as a child. No medication since age 54.  Marland Kitchen Hyperlipidemia   . Hypertension   . Tachycardia   . History of frequent urinary tract infections   . GERD (gastroesophageal reflux disease)   . Binge eating   . Endometriosis     History   Social History  . Marital Status: Married    Spouse Name: N/A    Number of Children: 1  . Years of Education: N/A   Occupational History  .      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 Smoking status: Current Every Day Smoker -- 20 years    Types: Cigarettes    Last Attempt to Quit: 10/19/2011  . Smokeless tobacco: Never Used     Comment: Using electronic cigarette.  . Alcohol Use: 0.5 oz/week    1 drink(s) per week  . Drug Use: Not on file  . Sexual Activity: Not on file   Other Topics Concern  . 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 Cholecystectomy  2006  . Cesarean section  2002  . Pilonidal cyst excision  2003  . Laporoscopy       Family History  Problem Relation Age of Onset  . Arthritis Father   . Hypertension Father   . Heart disease Maternal Grandmother     ICD  . Cancer Maternal Grandfather     lung  . Diabetes Neg Hx     Allergies  Allergen Reactions  . Adhesive [Tape]     Skin sensitivity  . Banana     migraine  . Morphine And Related Itching    hallucinations  . Other Itching    WALNUTS.  Gums itch.  . Pertussis Vaccines     seizures  . Tramadol Anxiety    Current Outpatient Prescriptions on File Prior to Visit  Medication Sig Dispense Refill  . Calcium Carbonate-Vitamin D (CALTRATE 600+D) 600-400 MG-UNIT per tablet Take 1 tablet by mouth daily.       . hydrochlorothiazide (HYDRODIURIL) 25 MG tablet Take 1 tablet (25 mg total) by mouth daily.  30 tablet  5  . metoprolol succinate (TOPROL-XL) 25 MG 24  hr tablet Take 1 tablet (25 mg total) by mouth daily.  30 tablet  5  . nitrofurantoin (MACRODANTIN) 50 MG capsule Take 50 mg by mouth daily.      . ondansetron (ZOFRAN) 4 MG tablet Take 1 tablet (4 mg total) by mouth every 8 (eight) hours as needed for nausea.  20 tablet  0  . pantoprazole (PROTONIX) 40 MG tablet Take 1 tablet (40 mg total) by mouth daily.  90 tablet  3  . potassium chloride SA (K-DUR,KLOR-CON) 20 MEQ tablet Take 1 tablet (20 mEq total) by mouth daily.  30 tablet  2  . Prenatal Vit-Fe Sulfate-FA (PRENATAL VITAMIN PO) Take 1 tablet by mouth daily.      Marland Kitchen zolmitriptan (ZOMIG) 5 MG nasal solution Place 1 spray into the nose as needed for migraine.       No current facility-administered medications on file prior to visit.    BP 110/70  Pulse 68  Temp(Src) 98.3 F (36.8 C) (Oral)  Resp 18  Wt 206 lb (93.441 kg)  BMI 37.67 kg/m2  SpO2 99%    Objective:   Physical Exam  Constitutional: She is oriented to person, place, and time. She appears well-developed and well-nourished. No distress.  HENT:  Head: Normocephalic and atraumatic.  Right Ear: Tympanic membrane and  ear canal normal.  Left Ear: Tympanic membrane and ear canal normal.  Mouth/Throat: No oropharyngeal exudate, posterior oropharyngeal edema or posterior oropharyngeal erythema.  Cardiovascular: Normal rate and regular rhythm.   No murmur heard. Pulmonary/Chest: Effort normal and breath sounds normal. No respiratory distress. She has no wheezes. She has no rales. She exhibits no tenderness.  Musculoskeletal:  + swelling of the left foot.  No erythema, no tenderness  Neurological: She is alert and oriented to person, place, and time.  Psychiatric: She has a normal mood and affect. Her behavior is normal. Thought content normal.          Assessment & Plan:

## 2012-11-14 NOTE — Assessment & Plan Note (Addendum)
Deteriortated. Trial of nasal steroid.  She is intolerant to claritin and zyrtec due to "heart racing." she is able to take benadryl prn.  She is instructed to let me know if symptoms worsen, or if not improved in 2-3 days.

## 2012-11-14 NOTE — Assessment & Plan Note (Addendum)
LE doppler is obtained today and is negative.  She continues HCTZ. Advised pt to keep leg elevated as much as possible.  Discussed compression stockings, but she declines.  Pt is advised to call if symptoms worsen or if symptoms do not improve over the next few weeks.

## 2012-11-14 NOTE — Patient Instructions (Addendum)
Complete your Leg ultrasound on the first floor at 5:30. Start rhinocort. You can use benadryl at bedtime as needed for allergy. Please call if nasal symptoms worsen or if not improved in 2-3 days.

## 2012-12-03 ENCOUNTER — Ambulatory Visit (INDEPENDENT_AMBULATORY_CARE_PROVIDER_SITE_OTHER): Payer: BC Managed Care – PPO | Admitting: Family

## 2012-12-03 VITALS — BP 100/70 | HR 80 | Temp 98.2°F | Wt 204.0 lb

## 2012-12-03 DIAGNOSIS — B349 Viral infection, unspecified: Secondary | ICD-10-CM

## 2012-12-03 DIAGNOSIS — B9789 Other viral agents as the cause of diseases classified elsewhere: Secondary | ICD-10-CM

## 2012-12-03 DIAGNOSIS — N912 Amenorrhea, unspecified: Secondary | ICD-10-CM

## 2012-12-03 NOTE — Progress Notes (Signed)
  Subjective:    Patient ID: Chelsea Haley, female    DOB: 01/25/1976, 37 y.o.   MRN: [id number]  HPI  Chelsea Haley is a 37 yr old female who presents today with chief complaint of malaise. Reports that she woke up this am and had nausea, "skin ached." Reports associated anorexia. Was able to eat a peanut butter sandwhich.  Feels "really bad."    Review of Systems  HENT:       Mild nasal congestion yesterday, worse today.  Blood tinged nasal discharge this AM.  Respiratory:       Mild cough.    Genitourinary: Negative for dysuria and frequency.       Objective:   Physical Exam  Constitutional: She is oriented to person, place, and time. She appears well-developed and well-nourished. No distress.  HENT:  Head: Normocephalic and atraumatic.  Right Ear: Tympanic membrane and ear canal normal.  Left Ear: Tympanic membrane and ear canal normal.  Mouth/Throat: No oropharyngeal exudate, posterior oropharyngeal edema or posterior oropharyngeal erythema.  Cardiovascular: Normal rate and regular rhythm.   No murmur heard. Pulmonary/Chest: Effort normal and breath sounds normal. No respiratory distress. She has no wheezes. She has no rales. She exhibits no tenderness.  Musculoskeletal: She exhibits no edema.  Neurological: She is alert and oriented to person, place, and time.  Psychiatric: She has a normal mood and affect. Her behavior is normal. Judgment and thought content normal.          Assessment & Plan:

## 2012-12-03 NOTE — Patient Instructions (Addendum)
Viral Infections °A virus is a type of germ. Viruses can cause: °· Minor sore throats. °· Aches and pains. °· Headaches. °· Runny nose. °· Rashes. °· Watery eyes. °· Tiredness. °· Coughs. °· Loss of appetite. °· Feeling sick to your stomach (nausea). °· Throwing up (vomiting). °· Watery poop (diarrhea). °HOME CARE  °· Only take medicines as told by your doctor. °· Drink enough water and fluids to keep your pee (urine) clear or pale yellow. Sports drinks are a good choice. °· Get plenty of rest and eat healthy. Soups and broths with crackers or rice are fine. °GET HELP RIGHT AWAY IF:  °· You have a very bad headache. °· You have shortness of breath. °· You have chest pain or neck pain. °· You have an unusual rash. °· You cannot stop throwing up. °· You have watery poop that does not stop. °· You cannot keep fluids down. °· You or your child has a temperature by mouth above 102° F (38.9° C), not controlled by medicine. °· Your baby is older than 3 months with a rectal temperature of 102° F (38.9° C) or higher. °· Your baby is 3 months old or younger with a rectal temperature of 100.4° F (38° C) or higher. °MAKE SURE YOU:  °· Understand these instructions. °· Will watch this condition. °· Will get help right away if you are not doing well or get worse. °Document Released: 02/11/2008 Document Revised: 05/23/2011 Document Reviewed: 07/06/2010 °[employer]® Patient Information ©2014 [employer]. ° °

## 2012-12-04 ENCOUNTER — Encounter: Payer: Self-pay | Admitting: Family

## 2012-12-04 DIAGNOSIS — B349 Viral infection, unspecified: Secondary | ICD-10-CM | POA: Insufficient documentation

## 2012-12-04 NOTE — Assessment & Plan Note (Signed)
Symptoms most consistent with viral illness.  Recommended supportive measures and pt to call if symptoms worsen or if no improvement in 3 days.

## 2013-01-09 ENCOUNTER — Ambulatory Visit (INDEPENDENT_AMBULATORY_CARE_PROVIDER_SITE_OTHER): Payer: BC Managed Care – PPO | Admitting: Obstetrics & Gynecology

## 2013-01-09 ENCOUNTER — Other Ambulatory Visit: Payer: Self-pay | Admitting: Family

## 2013-01-09 ENCOUNTER — Encounter: Payer: Self-pay | Admitting: Obstetrics & Gynecology

## 2013-01-09 VITALS — BP 136/81 | HR 84 | Temp 98.7°F | Ht 60.0 in | Wt 203.0 lb

## 2013-01-09 DIAGNOSIS — N970 Female infertility associated with anovulation: Secondary | ICD-10-CM

## 2013-01-09 DIAGNOSIS — Z23 Encounter for immunization: Secondary | ICD-10-CM

## 2013-01-09 DIAGNOSIS — N926 Irregular menstruation, unspecified: Secondary | ICD-10-CM

## 2013-01-09 LAB — COMPREHENSIVE METABOLIC PANEL
ALT: 11 U/L (ref 0–35)
AST: 14 U/L (ref 0–37)
Alkaline Phosphatase: 35 U/L — ABNORMAL LOW (ref 39–117)
CO2: 24 mEq/L (ref 19–32)
Creat: 0.7 mg/dL (ref 0.50–1.10)
Sodium: 137 mEq/L (ref 135–145)
Total Bilirubin: 0.3 mg/dL (ref 0.3–1.2)
Total Protein: 7.2 g/dL (ref 6.0–8.3)

## 2013-01-09 LAB — CBC
HCT: 40.9 % (ref 36.0–46.0)
MCV: 85.4 fL (ref 78.0–100.0)
RBC: 4.79 MIL/uL (ref 3.87–5.11)
WBC: 11.1 10*3/uL — ABNORMAL HIGH (ref 4.0–10.5)

## 2013-01-09 MED ORDER — INFLUENZA VAC SPLIT QUAD 0.5 ML IM SUSP: 0.5000 mL | Freq: Once | INTRAMUSCULAR | Status: DC

## 2013-01-09 NOTE — Telephone Encounter (Signed)
eScribe request for refill on Potassium/Klor-Con Last filled - 09.09.2013, #30x2 [Remains on active medication list in EMR] Last AEX - 09.22.14 Next AEX - appt scheduled for 12.22.14 Please Advise/SLS

## 2013-01-09 NOTE — Progress Notes (Signed)
Subjective:     Chelsea Haley is a 37 y.o. female here for a routine exam.  Current complaints: Patient is in the office to review her basal body temperature charts and discuss her next step..  Personal health questionnaire reviewed: no.   Gynecologic History Patient's last menstrual period was 12/07/2012. Contraception: none   Obstetric History OB History  No data available     The following portions of the patient's history were reviewed and updated as appropriate: allergies, current medications, past family history, past medical history, past social history, past surgical history and problem list.  Review of Systems Pertinent items are noted in HPI.    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 CBC  . Comprehensive metabolic panel  . TSH  . Anti mullerian hormone  . 17-Hydroxyprogesterone  . Prolactin  . Testosterone, free, total  . Progesterone  . Chlamydia Antibodies, IGG  . CA 125  . Hemoglobin A1c

## 2013-01-10 ENCOUNTER — Encounter: Payer: Self-pay | Admitting: Obstetrics & Gynecology

## 2013-01-10 ENCOUNTER — Other Ambulatory Visit: Payer: Self-pay | Admitting: *Deleted

## 2013-01-10 DIAGNOSIS — N926 Irregular menstruation, unspecified: Secondary | ICD-10-CM

## 2013-01-10 DIAGNOSIS — N970 Female infertility associated with anovulation: Secondary | ICD-10-CM | POA: Insufficient documentation

## 2013-01-10 LAB — HEMOGLOBIN A1C
Hgb A1c MFr Bld: 5.4 % (ref <5.7)
Mean Plasma Glucose: 108 mg/dL (ref <117)

## 2013-01-10 LAB — TSH: TSH: 1.159 u[IU]/mL (ref 0.350–4.500)

## 2013-01-10 LAB — TESTOSTERONE, FREE, TOTAL, SHBG
Sex Hormone Binding: 37 nmol/L (ref 18–114)
Testosterone, Free: 13.8 pg/mL — ABNORMAL HIGH (ref 0.6–6.8)
Testosterone-% Free: 1.7 % (ref 0.4–2.4)
Testosterone: 81 ng/dL — ABNORMAL HIGH (ref 10–70)

## 2013-01-10 LAB — PROGESTERONE: Progesterone: 0.8 ng/mL

## 2013-01-10 LAB — CA 125: CA 125: 10.7 U/mL (ref 0.0–30.2)

## 2013-01-10 LAB — PROLACTIN: Prolactin: 5.7 ng/mL

## 2013-01-10 NOTE — Patient Instructions (Signed)
Infertility WHAT IS INFERTILITY?  Infertility is usually defined as not being able to get pregnant after trying for one year of regular sexual intercourse without the use of contraceptives. Or not being able to carry a pregnancy to term and have a baby. The infertility rate in the [place] is around 10%. Pregnancy is the result of a chain of events. A woman must release an egg from one of her ovaries (ovulation). The egg must be fertilized by the female sperm. Then it travels through a fallopian tube into the uterus (womb), where it attaches to the wall of the uterus and grows. A man must have enough sperm, and the sperm must join with (fertilize) the egg along the way, at the proper time. The fertilized egg must then become attached to the inside of the uterus. While this may seem simple, many things can happen to prevent pregnancy from occurring.  WHOSE PROBLEM IS IT?  About 20% of infertility cases are due to problems with the man (female factors) and 65% are due to problems with the woman (female factors). Other cases are due to a combination of female and female factors or to unknown causes.  WHAT CAUSES INFERTILITY IN MEN?  Infertility in men is often caused by problems with making enough normal sperm or getting the sperm to reach the egg. Problems with sperm may exist from birth or develop later in life, due to illness or injury. Some men produce no sperm, or produce too few sperm (oligospermia). Other problems include:  Sexual dysfunction.  Hormonal or endocrine problems.  Age. Female fertility decreases with age, but not at as young an age as female fertility.  Infection.  Congenital problems. Birth defect, such as absence of the tubes that carry the sperm (vas deferens).  Genetic/chromosomal problems.  Antisperm antibody problems.  Retrograde ejaculation (sperm go into the bladder).  Varicoceles, spematoceles, or tumors of the testicles.  Lifestyle can influence the number and  quality of a man's sperm.  Alcohol and drugs can temporarily reduce sperm quality.  Environmental toxins, including pesticides and lead, may cause some cases of infertility in men. WHAT CAUSES INFERTILITY IN WOMEN?   Problems with ovulation account for most infertility in women. Without ovulation, eggs are not available to be fertilized.  Signs of problems with ovulation include irregular menstrual periods or no periods at all.  Simple lifestyle factors, including stress, diet, or athletic training, can affect a woman's hormonal balance.  Age. Fertility begins to decrease in women in the early 30s and is worse after age 37.  Much less often, a hormonal imbalance from a serious medical problem, such as a pituitary gland tumor, thyroid or other chronic medical disease, can cause ovulation problems.  Pelvic infections.  Polycystic ovary syndrome (increase in female hormones, unable to ovulate).  Alcohol or illegal drugs.  Environmental toxins, radiation, pesticides, and certain chemicals.  Aging is an important factor in female infertility.  The ability of a woman's ovaries to produce eggs declines with age, especially after age 35. About one third of couples where the woman is over 35 will have problems with fertility.  By the time she reaches menopause when her monthly periods stop for good, a woman can no longer produce eggs or become pregnant.  Other problems can also lead to infertility in women. If the fallopian tubes are blocked at one or both ends, the egg cannot travel through the tubes into the uterus. Scar tissue (adhesions) in the pelvis may cause blocked   tubes. This may result from pelvic inflammatory disease, endometriosis, or surgery for an ectopic pregnancy (fertilized egg implanted outside the uterus) or any pelvic or abdominal surgery causing adhesions.  Fibroid tumors or polyps of the uterus.  Congenital (birth defect) abnormalities of the uterus.  Infection of the  cervix (cervicitis).  Cervical stenosis (narrowing).  Abnormal cervical mucus.  Polycystic ovary syndrome.  Having sexual intercourse too often (every other day or 4 to 5 times a week).  Obesity.  Anorexia.  Poor nutrition.  Over exercising, with loss of body fat.  DES. Your mother received diethylstilbesterol hormone when pregnant with you. HOW IS INFERTILITY TESTED?  If you have been trying to have a baby without success, you may want to seek medical help. You should not wait for one year of trying before seeing a health care provider if:  You are over 35.  You have reason to believe that there may be a fertility problem. A medical evaluation may determine the reasons for a couple's infertility. Usually this process begins with:  Physical exams.  Medical histories of both partners.  Sexual histories of both partners. If there is no obvious problem, like improperly timed intercourse or absence of ovulation, tests may be needed.   For a man, testing usually begins with tests of his semen to look at:  The number of sperm.  The shape of sperm.  Movement of his sperm.  Taking a complete medical and surgical history.  Physical examination.  Check for infection of the female reproductive organs. Sometimes hormone tests are done.   For a woman, the first step in testing is to find out if she is ovulating each month. There are several ways to do this. For example, she can keep track of changes in her morning body temperature and in the texture of her cervical mucus. Another tool is a home ovulation test kit, which can be bought at drug or grocery stores.  Checks of ovulation can also be done in the doctor's office, using blood tests for hormone levels or ultrasound tests of the ovaries. If the woman is ovulating, more tests will need to be done. Some common female tests include:  Hysterosalpingogram: An x-ray of the fallopian tubes and uterus after they are injected with  dye. It shows if the tubes are open and shows the shape of the uterus.  Laparoscopy: An exam of the tubes and other female organs for disease. A lighted tube called a laparoscope is used to see inside the abdomen.  Endometrial biopsy: Sample of uterus tissue taken on the first day of the menstrual period, to see if the tissue indicates you are ovulating.  Transvaginal ultrasound: Examines the female organs.  Hysteroscopy: Uses a lighted tube to examine the cervix and inside the uterus, to see if there are any abnormalities inside the uterus. TREATMENT  Depending on the test results, different treatments can be suggested. The type of treatment depends on the cause. 85 to 90% of infertility cases are treated with drugs or surgery.   Various fertility drugs may be used for women with ovulation problems. It is important to talk with your caregiver about the drug to be used. You should understand the drug's benefits and side effects. Depending on the type of fertility drug and the dosage of the drug used, multiple births (twins or multiples) can occur in some women.  If needed, surgery can be done to repair damage to a woman's ovaries, fallopian tubes, cervix, or uterus.  Surgery   or medical treatment for endometriosis or polycystic ovary syndrome. Sometimes a man has an infertility problem that can be corrected with medicine or by surgery.  Intrauterine insemination (IUI) of sperm, timed with ovulation.  Change in lifestyle, if that is the cause (lose weight, increase exercise, and stop smoking, drinking excessively, or taking illegal drugs).  Other types of surgery:  Removing growths inside and on the uterus.  Removing scar tissue from inside of the uterus.  Fixing blocked tubes.  Removing scar tissue in the pelvis and around the female organs. WHAT IS ASSISTED REPRODUCTIVE TECHNOLOGY (ART)?  Assisted reproductive technology (ART) is another form of special methods used to help infertile  couples. ART involves handling both the woman's eggs and the man's sperm. Success rates vary and depend on many factors. ART can be expensive and time-consuming. But ART has made it possible for many couples to have children that otherwise would not have been conceived. Some methods are listed below:  In vitro fertilization (IVF). IVF is often used when a woman's fallopian tubes are blocked or when a man has low sperm counts. A drug is used to stimulate the ovaries to produce multiple eggs. Once mature, the eggs are removed and placed in a culture dish with the man's sperm for fertilization. After about 40 hours, the eggs are examined to see if they have become fertilized by the sperm and are dividing into cells. These fertilized eggs (embryos) are then placed in the woman's uterus. This bypasses the fallopian tubes.  Gamete intrafallopian transfer (GIFT) is similar to IVF, but used when the woman has at least one normal fallopian tube. Three to five eggs are placed in the fallopian tube, along with the man's sperm, for fertilization inside the woman's body.  Zygote intrafallopian transfer (ZIFT), also called tubal embryo transfer, combines IVF and GIFT. The eggs retrieved from the woman's ovaries are fertilized in the lab and placed in the fallopian tubes rather than in the uterus.  ART procedures sometimes involve the use of donor eggs (eggs from another woman) or previously frozen embryos. Donor eggs may be used if a woman has impaired ovaries or has a genetic disease that could be passed on to her baby.  When performing ART, you are at higher risk for resulting in multiple pregnancies, twins, triplets or more.  Intracytoplasma sperm injection is a procedure that injects a single sperm into the egg to fertilize it.  Embryo transplant is a procedure that starts after growing an embryo in a special media (chemical solution) developed to keep the embryo alive for 2 to 5 days, and then transplanting it  into the uterus. In cases where a cause cannot be found and pregnancy does not occur, adoption may be a consideration. Document Released: 03/03/2003 Document Revised: 05/23/2011 Document Reviewed: 01/27/2009 [employer] Patient Information 2014 [employer]  

## 2013-01-14 ENCOUNTER — Encounter: Payer: Self-pay | Admitting: Obstetrics & Gynecology

## 2013-01-14 DIAGNOSIS — E282 Polycystic ovarian syndrome: Secondary | ICD-10-CM | POA: Insufficient documentation

## 2013-01-14 LAB — CHLAMYDIA ANTIBODIES, IGG: Chlamydia psittaci IgG: <1:16 {titer}

## 2013-01-17 ENCOUNTER — Ambulatory Visit (HOSPITAL_COMMUNITY): Payer: BC Managed Care – PPO

## 2013-01-18 ENCOUNTER — Ambulatory Visit (HOSPITAL_COMMUNITY)
Admission: RE | Admit: 2013-01-18 | Discharge: 2013-01-18 | Disposition: A | Discharge status: Home / Self Care | Payer: BC Managed Care – PPO | Source: Ambulatory Visit | Attending: Obstetrics & Gynecology | Admitting: Obstetrics & Gynecology

## 2013-01-18 DIAGNOSIS — N926 Irregular menstruation, unspecified: Secondary | ICD-10-CM | POA: Insufficient documentation

## 2013-01-18 DIAGNOSIS — Z8742 Personal history of other diseases of the female genital tract: Secondary | ICD-10-CM | POA: Insufficient documentation

## 2013-01-21 ENCOUNTER — Ambulatory Visit: Payer: BC Managed Care – PPO | Admitting: Obstetrics & Gynecology

## 2013-01-23 ENCOUNTER — Ambulatory Visit (INDEPENDENT_AMBULATORY_CARE_PROVIDER_SITE_OTHER): Payer: BC Managed Care – PPO | Admitting: Obstetrics & Gynecology

## 2013-01-23 ENCOUNTER — Encounter: Payer: Self-pay | Admitting: Obstetrics & Gynecology

## 2013-01-23 ENCOUNTER — Ambulatory Visit: Payer: BC Managed Care – PPO | Admitting: Obstetrics & Gynecology

## 2013-01-23 ENCOUNTER — Encounter: Payer: Self-pay | Admitting: Family

## 2013-01-23 VITALS — BP 127/85 | HR 86 | Temp 98.3°F | Ht 60.0 in | Wt 205.0 lb

## 2013-01-23 DIAGNOSIS — E282 Polycystic ovarian syndrome: Secondary | ICD-10-CM

## 2013-01-23 DIAGNOSIS — Z3202 Encounter for pregnancy test, result negative: Secondary | ICD-10-CM

## 2013-01-23 DIAGNOSIS — Z7251 High risk heterosexual behavior: Secondary | ICD-10-CM

## 2013-01-23 LAB — POCT URINALYSIS DIPSTICK
Bilirubin, UA: NEGATIVE
Glucose, UA: NEGATIVE
Ketones, UA: NEGATIVE
Leukocytes, UA: NEGATIVE
Nitrite, UA: NEGATIVE
Protein, UA: NEGATIVE
Spec Grav, UA: 1.015

## 2013-01-23 LAB — HDL CHOLESTEROL: HDL: 53 mg/dL (ref >39)

## 2013-01-23 LAB — CHOLESTEROL, TOTAL: Cholesterol: 197 mg/dL (ref 0–200)

## 2013-01-23 MED ORDER — VARENICLINE TARTRATE 0.5 MG PO TABS: 0.5000 mg | ORAL_TABLET | Freq: Two times a day (BID) | ORAL | Status: DC

## 2013-01-23 MED ORDER — COMPLETENATE 29-1 MG PO CHEW: 1.0000 | CHEWABLE_TABLET | Freq: Every day | ORAL | Status: DC

## 2013-01-23 MED ORDER — VARENICLINE TARTRATE 1 MG PO TABS: 1.0000 mg | ORAL_TABLET | Freq: Two times a day (BID) | ORAL | Status: DC

## 2013-01-23 MED ORDER — LETROZOLE 2.5 MG PO TABS: 2.5000 mg | ORAL_TABLET | Freq: Every day | ORAL | Status: DC

## 2013-01-23 MED ORDER — MEDROXYPROGESTERONE ACETATE 5 MG PO TABS: 5.0000 mg | ORAL_TABLET | Freq: Every day | ORAL | Status: DC

## 2013-01-23 NOTE — Patient Instructions (Signed)
Smoking Cessation Quitting smoking is important to your health and has many advantages. However, it is not always easy to quit since nicotine is a very addictive drug. Often times, people try 3 times or more before being able to quit. This document explains the best ways for you to prepare to quit smoking. Quitting takes hard work and a lot of effort, but you can do it. ADVANTAGES OF QUITTING SMOKING  You will live longer, feel better, and live better.  Your body will feel the impact of quitting smoking almost immediately.  Within 20 minutes, blood pressure decreases. Your pulse returns to its normal level.  After 8 hours, carbon monoxide levels in the blood return to normal. Your oxygen level increases.  After 24 hours, the chance of having a heart attack starts to decrease. Your breath, hair, and body stop smelling like smoke.  After 48 hours, damaged nerve endings begin to recover. Your sense of taste and smell improve.  After 72 hours, the body is virtually free of nicotine. Your bronchial tubes relax and breathing becomes easier.  After 2 to 12 weeks, lungs can hold more air. Exercise becomes easier and circulation improves.  The risk of having a heart attack, stroke, cancer, or lung disease is greatly reduced.  After 1 year, the risk of coronary heart disease is cut in half.  After 5 years, the risk of stroke falls to the same as a nonsmoker.  After 10 years, the risk of lung cancer is cut in half and the risk of other cancers decreases significantly.  After 15 years, the risk of coronary heart disease drops, usually to the level of a nonsmoker.  If you are pregnant, quitting smoking will improve your chances of having a healthy baby.  The people you live with, especially any children, will be healthier.  You will have extra money to spend on things other than cigarettes. QUESTIONS TO THINK ABOUT BEFORE ATTEMPTING TO QUIT You may want to talk about your answers with your  caregiver.  Why do you want to quit?  If you tried to quit in the past, what helped and what did not?  What will be the most difficult situations for you after you quit? How will you plan to handle them?  Who can help you through the tough times? Your family? Friends? A caregiver?  What pleasures do you get from smoking? What ways can you still get pleasure if you quit? Here are some questions to ask your caregiver:  How can you help me to be successful at quitting?  What medicine do you think would be best for me and how should I take it?  What should I do if I need more help?  What is smoking withdrawal like? How can I get information on withdrawal? GET READY  Set a quit date.  Change your environment by getting rid of all cigarettes, ashtrays, matches, and lighters in your home, car, or work. Do not let people smoke in your home.  Review your past attempts to quit. Think about what worked and what did not. GET SUPPORT AND ENCOURAGEMENT You have a better chance of being successful if you have help. You can get support in many ways.  Tell your family, friends, and co-workers that you are going to quit and need their support. Ask them not to smoke around you.  Get individual, group, or telephone counseling and support. Programs are available at local hospitals and health centers. Call your local health department for   information about programs in your area.  Spiritual beliefs and practices may help some smokers quit.  Download a "quit meter" on your computer to keep track of quit statistics, such as how long you have gone without smoking, cigarettes not smoked, and money saved.  Get a self-help book about quitting smoking and staying off of tobacco. LEARN NEW SKILLS AND BEHAVIORS  Distract yourself from urges to smoke. Talk to someone, go for a walk, or occupy your time with a task.  Change your normal routine. Take a different route to work. Drink tea instead of coffee.  Eat breakfast in a different place.  Reduce your stress. Take a hot bath, exercise, or read a book.  Plan something enjoyable to do every day. Reward yourself for not smoking.  Explore interactive web-based programs that specialize in helping you quit. GET MEDICINE AND USE IT CORRECTLY Medicines can help you stop smoking and decrease the urge to smoke. Combining medicine with the above behavioral methods and support can greatly increase your chances of successfully quitting smoking.  Nicotine replacement therapy helps deliver nicotine to your body without the negative effects and risks of smoking. Nicotine replacement therapy includes nicotine gum, lozenges, inhalers, nasal sprays, and skin patches. Some may be available over-the-counter and others require a prescription.  Antidepressant medicine helps people abstain from smoking, but how this works is unknown. This medicine is available by prescription.  Nicotinic receptor partial agonist medicine simulates the effect of nicotine in your brain. This medicine is available by prescription. Ask your caregiver for advice about which medicines to use and how to use them based on your health history. Your caregiver will tell you what side effects to look out for if you choose to be on a medicine or therapy. Carefully read the information on the package. Do not use any other product containing nicotine while using a nicotine replacement product.  RELAPSE OR DIFFICULT SITUATIONS Most relapses occur within the first 3 months after quitting. Do not be discouraged if you start smoking again. Remember, most people try several times before finally quitting. You may have symptoms of withdrawal because your body is used to nicotine. You may crave cigarettes, be irritable, feel very hungry, cough often, get headaches, or have difficulty concentrating. The withdrawal symptoms are only temporary. They are strongest when you first quit, but they will go away within  10 14 days. To reduce the chances of relapse, try to:  Avoid drinking alcohol. Drinking lowers your chances of successfully quitting.  Reduce the amount of caffeine you consume. Once you quit smoking, the amount of caffeine in your body increases and can give you symptoms, such as a rapid heartbeat, sweating, and anxiety.  Avoid smokers because they can make you want to smoke.  Do not let weight gain distract you. Many smokers will gain weight when they quit, usually less than 10 pounds. Eat a healthy diet and stay active. You can always lose the weight gained after you quit.  Find ways to improve your mood other than smoking. FOR MORE INFORMATION  www.smokefree.gov  Document Released: 02/22/2001 Document Revised: 08/30/2011 Document Reviewed: 06/09/2011 [facility] Patient Information 2014 [place], [place]. Polycystic Ovarian Syndrome Polycystic ovarian syndrome is a condition with a number of problems. One problem is with the ovaries. The ovaries are organs located in the female pelvis, on each side of the uterus. Usually, during the menstrual cycle, an egg is released from 1 ovary every month. This is called ovulation. When the egg is fertilized,  it goes into the womb (uterus), which allows for the growth of a baby. The egg travels from the ovary through the fallopian tube to the uterus. The ovaries also make the hormones estrogen and progesterone. These hormones help the development of a woman's breasts, body shape, and body hair. They also regulate the menstrual cycle and pregnancy. Sometimes, cysts form in the ovaries. A cyst is a fluid-filled sac. On the ovary, different types of cysts can form. The most common type of ovarian cyst is called a functional or ovulation cyst. It is normal, and often forms during the normal menstrual cycle. Each month, a woman's ovaries grow tiny cysts that hold the eggs. When an egg is fully grown, the sac breaks open. This releases the egg. Then, the sac which  released the egg from the ovary dissolves. In one type of functional cyst, called a follicle cyst, the sac does not break open to release the egg. It may actually continue to grow. This type of cyst usually disappears within 1 to 3 months.  One type of cyst problem with the ovaries is called Polycystic Ovarian Syndrome (PCOS). In this condition, many follicle cysts form, but do not rupture and produce an egg. This health problem can affect the following:  Menstrual cycle.  Heart.  Obesity.  Cancer of the uterus.  Fertility.  Blood vessels.  Hair growth (face and body) or baldness.  Hormones.  Appearance.  High blood pressure.  Stroke.  Insulin production.  Inflammation of the liver.  Elevated blood cholesterol and triglycerides. CAUSES   No one knows the exact cause of PCOS.  Women with PCOS often have a mother or sister with PCOS. There is not yet enough proof to say this is inherited.  Many women with PCOS have a weight problem.  Researchers are looking at the relationship between PCOS and the body's ability to make insulin. Insulin is a hormone that regulates the change of sugar, starches, and other food into energy for the body's use, or for storage. Some women with PCOS make too much insulin. It is possible that the ovaries react by making too many female hormones, called androgens. This can lead to acne, excessive hair growth, weight gain, and ovulation problems.  Too much production of luteinizing hormone (LH) from the pituitary gland in the brain stimulates the ovary to produce too much female hormone (androgen). SYMPTOMS   Infrequent or no menstrual periods, and/or irregular bleeding.  Inability to get pregnant (infertility), because of not ovulating.  Increased growth of hair on the face, chest, stomach, back, thumbs, thighs, or toes.  Acne, oily skin, or dandruff.  Pelvic pain.  Weight gain or obesity, usually carrying extra weight around the waist.  Type  2 diabetes (this is the diabetes that usually does not need insulin).  High cholesterol.  High blood pressure.  Female-pattern baldness or thinning hair.  Patches of thickened and dark brown or black skin on the neck, arms, breasts, or thighs.  Skin tags, or tiny excess flaps of skin, in the armpits or neck area.  Sleep apnea (excessive snoring and breathing stops at times while asleep).  Deepening of the voice.  Gestational diabetes when pregnant.  Increased risk of miscarriage with pregnancy. DIAGNOSIS  There is no single test to diagnose PCOS.   Your caregiver will:  Take a medical history.  Perform a pelvic exam.  Perform an ultrasound.  Check your female and female hormone levels.  Measure glucose or sugar levels in the blood.  Do other blood tests.  If you are producing too many female hormones, your caregiver will make sure it is from PCOS. At the physical exam, your caregiver will want to evaluate the areas of increased hair growth. Try to allow natural hair growth for a few days before the visit.  During a pelvic exam, the ovaries may be enlarged or swollen by the increased number of small cysts. This can be seen more easily by vaginal ultrasound or screening, to examine the ovaries and lining of the uterus (endometrium) for cysts. The uterine lining may become thicker, if there has not been a regular period. TREATMENT  Because there is no cure for PCOS, it needs to be managed to prevent problems. Treatments are based on your symptoms. Treatment is also based on whether you want to have a baby or whether you need contraception.  Treatment may include:  Progesterone hormone, to start a menstrual period.  Birth control pills, to make you have regular menstrual periods.  Medicines to make you ovulate, if you want to get pregnant.  Medicines to control your insulin.  Medicine to control your blood pressure.  Medicine and diet, to control your high cholesterol and  triglycerides in your blood.  Surgery, making small holes in the ovary, to decrease the amount of female hormone production. This is done through a long, lighted tube (laparoscope), placed into the pelvis through a tiny incision in the lower abdomen. Your caregiver will go over some of the choices with you. WOMEN WITH PCOS HAVE THESE CHARACTERISTICS:  High levels of female hormones called androgens.  An irregular or no menstrual cycle.  May have many small cysts in their ovaries. PCOS is the most common hormonal reproductive problem in women of childbearing age. WHY DO WOMEN WITH PCOS HAVE TROUBLE WITH THEIR MENSTRUAL CYCLE? Each month, about 20 eggs start to mature in the ovaries. As one egg grows and matures, the follicle breaks open to release the egg, so it can travel through the fallopian tube for fertilization. When the single egg leaves the follicle, ovulation takes place. In women with PCOS, the ovary does not make all of the hormones it needs for any of the eggs to fully mature. They may start to grow and accumulate fluid, but no one egg becomes large enough. Instead, some may remain as cysts. Since no egg matures or is released, ovulation does not occur and the hormone progesterone is not made. Without progesterone, a woman's menstrual cycle is irregular or absent. Also, the cysts produce female hormones, which continue to prevent ovulation.  Document Released: 06/24/2004 Document Revised: 05/23/2011 Document Reviewed: 08/16/2012 [facility] Patient Information 2014 [place], [place]. Pregnancy If you are planning on getting pregnant, it is a good idea to make a preconception appointment with your caregiver to discuss having a healthy lifestyle before getting pregnant. This includes diet, weight, exercise, taking prenatal vitamins (especially folic acid, which helps prevent brain and spinal cord defects), avoiding alcohol, smoking and illegal drugs, medical problems (diabetes, convulsions), family  history of genetic problems, working conditions, and immunizations. It is better to have knowledge of these things and do something about them before getting pregnant. During your pregnancy, it is important to follow certain guidelines in order to have a healthy baby. It is very important to get good prenatal care and follow your caregiver's instructions. Prenatal care includes all the medical care you receive before your baby's birth. This helps to prevent problems during the pregnancy and childbirth. HOME CARE INSTRUCTIONS  Start your prenatal visits by the 12th week of pregnancy or earlier, if possible. At first, appointments are usually scheduled monthly. They become more frequent in the last 2 months before delivery. It is important that you keep your caregiver's appointments and follow your caregiver's instructions regarding medication use, exercise, and diet.  During pregnancy, you are providing food for you and your baby. Eat a regular, well-balanced diet. Choose foods such as meat, fish, milk and other dairy products, vegetables, fruits, whole-grain breads and cereals. Your caregiver will inform you of the ideal weight gain depending on your current height and weight. Drink lots of liquids. Try to drink 8 glasses of water a day.  Alcohol is associated with a number of birth defects including fetal alcohol syndrome. It is best to avoid alcohol completely. Smoking will cause low birth rate and prematurity. Use of alcohol and nicotine during your pregnancy also increases the chances that your child will be chemically dependent later in their life and may contribute to SIDS (Sudden Infant Death Syndrome).  Do not use illegal drugs.  Only take prescription or over-the-counter medications that are recommended by your caregiver. Other medications can cause genetic and physical problems in the baby.  Morning sickness can often be helped by keeping soda crackers at the bedside. Eat a few before  getting up in the morning.  A sexual relationship may be continued until near the end of pregnancy if there are no other problems such as early (premature) leaking of amniotic fluid from the membranes, vaginal bleeding, painful intercourse or belly (abdominal) pain.  Exercise regularly. Check with your caregiver if you are unsure of the safety of some of your exercises.  Do not use hot tubs, steam rooms or saunas. These increase the risk of fainting and hurting yourself and the baby. Swimming is OK for exercise. Get plenty of rest, including afternoon naps when possible, especially in the third trimester.  Avoid toxic odors and chemicals.  Do not wear high heels. They may cause you to lose your balance and fall.  Do not lift over 5 pounds. If you do lift anything, lift with your legs and thighs, not your back.  Avoid long trips, especially in the third trimester.  If you have to travel out of the city or state, take a copy of your medical records with you. SEEK IMMEDIATE MEDICAL CARE IF:   You develop an unexplained oral temperature above 102 F (38.9 C), or as your caregiver suggests.  You have leaking of fluid from the vagina. If leaking membranes are suspected, take your temperature and inform your caregiver of this when you call.  There is vaginal spotting or bleeding. Notify your caregiver of the amount and how many pads are used.  You continue to feel sick to your stomach (nauseous) and have no relief from remedies suggested, or you throw up (vomit) blood or coffee ground like materials.  You develop upper abdominal pain.  You have round ligament discomfort in the lower abdominal area. This still must be evaluated by your caregiver.  You feel contractions of the uterus.  You do not feel the baby move, or there is less movement than before.  You have painful urination.  You have abnormal vaginal discharge.  You have persistent diarrhea.  You get a severe  headache.  You have problems with your vision.  You develop muscle weakness.  You feel dizzy and faint.  You develop shortness of breath.  You develop chest pain.  You have  back pain that travels down to your leg and feet.  You feel irregular or a very fast heartbeat.  You develop excessive weight gain in a short period of time (5 pounds in 3 to 5 days).  You are involved in a domestic violence situation. Document Released: 02/28/2005 Document Revised: 08/30/2011 Document Reviewed: 08/22/2008 [facility] Patient Information 2014 [place], [place].

## 2013-01-23 NOTE — Progress Notes (Signed)
Subjective:     Chelsea Haley is a 37 y.o. female here for a follow exam.  Current complaints: continues to have cramping periodically, irregular menstrual cycles.  Personal health questionnaire reviewed: yes.   Gynecologic History Patient's last menstrual period was 12/07/2012. Contraception: none Last Pap:May/ 2013. Results were: normal Last mammogram: 1999. Results were: normal  Obstetric History OB History  No data available     The following portions of the patient's history were reviewed and updated as appropriate: allergies, current medications, past family history, past medical history, past social history, past surgical history and problem list.  Review of Systems Pertinent items are noted in HPI.    Objective:     No exam today     Assessment:    PCOS  Plan:   Orders Placed This Encounter  Procedures  . Cholesterol, total  . HDL cholesterol  . HIV antibody  . RPR  . Hepatitis B surface antigen  . Varicella zoster antibody, IgG  . Rubella screen  . POCT urine pregnancy  . POCT Urinalysis Dipstick  Counseled re: smoking cessation/weight loss/ovulation induction w/aromatase inhibitor/folic acid Return prn

## 2013-01-24 ENCOUNTER — Ambulatory Visit: Payer: BC Managed Care – PPO | Admitting: Obstetrics & Gynecology

## 2013-01-24 LAB — RPR

## 2013-01-24 LAB — HEPATITIS B SURFACE ANTIGEN: Hepatitis B Surface Ag: NEGATIVE

## 2013-02-08 ENCOUNTER — Telehealth: Payer: Self-pay | Admitting: Family

## 2013-02-08 NOTE — Telephone Encounter (Signed)
Please Advise

## 2013-02-08 NOTE — Telephone Encounter (Signed)
Patient states that her ob-gyn had prescribed her provera which she started 5 days after her period. Her ob-gyn also prescribed her femara, she has been taking that cycle day 3-7. She states that she is now on cycle day 8 and is still bleeding. She states that her ob-gyn office is closed today and wasn't sure what she should do.

## 2013-02-08 NOTE — Telephone Encounter (Signed)
I have no further recommendations at this time.  She should be able to reach a physician at [facility] through their answering service.  If severe or heavy bleeding over the weekend she should be seen in the ED. Otherwise, She should be able to contact them on Monday AM.

## 2013-02-08 NOTE — Telephone Encounter (Signed)
Notified pt and she voices understanding. 

## 2013-02-11 ENCOUNTER — Encounter: Payer: Self-pay | Admitting: Obstetrics & Gynecology

## 2013-02-11 ENCOUNTER — Ambulatory Visit (INDEPENDENT_AMBULATORY_CARE_PROVIDER_SITE_OTHER): Payer: BC Managed Care – PPO | Admitting: Obstetrics & Gynecology

## 2013-02-11 VITALS — BP 123/78 | HR 75 | Temp 98.0°F | Ht 60.0 in | Wt 205.0 lb

## 2013-02-11 DIAGNOSIS — N926 Irregular menstruation, unspecified: Secondary | ICD-10-CM

## 2013-02-11 DIAGNOSIS — N939 Abnormal uterine and vaginal bleeding, unspecified: Secondary | ICD-10-CM

## 2013-02-11 LAB — POCT URINE PREGNANCY: Preg Test, Ur: NEGATIVE

## 2013-02-11 MED ORDER — MEDROXYPROGESTERONE ACETATE 10 MG PO TABS: 20.0000 mg | ORAL_TABLET | Freq: Three times a day (TID) | ORAL | Status: DC

## 2013-02-11 NOTE — Progress Notes (Signed)
Subjective:     Chelsea Haley is a 37 y.o. female here for a routine exam.  Current complaints: patient is currently on a cycle that has lasted for the last 11 days. Pt is currently trying to conceive and has taken her Provera and her Femara. Pt states she is having cramping and migraine headaches.  Personal health questionnaire reviewed: yes.   Gynecologic History Patient's last menstrual period was 02/01/2013. Contraception: none   Obstetric History OB History  No data available     The following portions of the patient's history were reviewed and updated as appropriate: allergies, current medications, past family history, past medical history, past social history, past surgical history and problem list.  Review of Systems Pertinent items are noted in HPI.    Objective:   Informal pelvic u/s: right-sided, unilocular cysts--4 cm  Assessment:   Ovarian cyst AUB--O  Plan:   Provera Hold Femara until cyst resolves Return in 1 mth

## 2013-02-12 ENCOUNTER — Encounter: Payer: Self-pay | Admitting: Obstetrics & Gynecology

## 2013-02-12 NOTE — Patient Instructions (Signed)
Ovarian Cyst  The ovaries are small organs that are on each side of the uterus. The ovaries are the organs that produce the female hormones, estrogen and progesterone. An ovarian cyst is a sac filled with fluid that can vary in its size. It is normal for a small cyst to form in women who are in the childbearing age and who have menstrual periods. This type of cyst is called a follicle cyst that becomes an ovulation cyst (corpus luteum cyst) after it produces the women's egg. It later goes away on its own if the woman does not become pregnant. There are other kinds of ovarian cysts that may cause problems and may need to be treated. The most serious problem is a cyst with cancer. It should be noted that menopausal women who have an ovarian cyst are at a higher risk of it being a cancer cyst. They should be evaluated very quickly, thoroughly and followed closely. This is especially true in menopausal women because of the high rate of ovarian cancer in women in menopause.  CAUSES AND TYPES OF OVARIAN CYSTS:   FUNCTIONAL CYST: The follicle/corpus luteum cyst is a functional cyst that occurs every month during ovulation with the menstrual cycle. They go away with the next menstrual cycle if the woman does not get pregnant. Usually, there are no symptoms with a functional cyst.   ENDOMETRIOMA CYST: This cyst develops from the lining of the uterus tissue. This cyst gets in or on the ovary. It grows every month from the bleeding during the menstrual period. It is also called a "chocolate cyst" because it becomes filled with blood that turns brown. This cyst can cause pain in the lower abdomen during intercourse and with your menstrual period.   CYSTADENOMA CYST: This cyst develops from the cells on the outside of the ovary. They usually are not cancerous. They can get very big and cause lower abdomen pain and pain with intercourse. This type of cyst can twist on itself, cut off its blood supply and cause severe pain. It  also can easily rupture and cause a lot of pain.   DERMOID CYST: This type of cyst is sometimes found in both ovaries. They are found to have different kinds of body tissue in the cyst. The tissue includes skin, teeth, hair, and/or cartilage. They usually do not have symptoms unless they get very big. Dermoid cysts are rarely cancerous.   POLYCYSTIC OVARY: This is a rare condition with hormone problems that produces many small cysts on both ovaries. The cysts are follicle-like cysts that never produce an egg and become a corpus luteum. It can cause an increase in body weight, infertility, acne, increase in body and facial hair and lack of menstrual periods or rare menstrual periods. Many women with this problem develop type 2 diabetes. The exact cause of this problem is unknown. A polycystic ovary is rarely cancerous.   THECA LUTEIN CYST: Occurs when too much hormone (human chorionic gonadotropin) is produced and over-stimulates the ovaries to produce an egg. They are frequently seen when doctors stimulate the ovaries for invitro-fertilization (test tube babies).   LUTEOMA CYST: This cyst is seen during pregnancy. Rarely it can cause an obstruction to the birth canal during labor and delivery. They usually go away after delivery.  SYMPTOMS    Pelvic pain or pressure.   Pain during sexual intercourse.   Increasing girth (swelling) of the abdomen.   Abnormal menstrual periods.   Increasing pain with menstrual periods.     You stop having menstrual periods and you are not pregnant.  DIAGNOSIS   The diagnosis can be made during:   Routine or annual pelvic examination (common).   Ultrasound.   X-ray of the pelvis.   CT Scan.   MRI.   Blood tests.  TREATMENT    Treatment may only be to follow the cyst monthly for 2 to 3 months with your caregiver. Many go away on their own, especially functional cysts.   May be aspirated (drained) with a long needle with ultrasound, or by laparoscopy (inserting a tube into  the pelvis through a small incision).   The whole cyst can be removed by laparoscopy.   Sometimes the cyst may need to be removed through an incision in the lower abdomen.   Hormone treatment is sometimes used to help dissolve certain cysts.   Birth control pills are sometimes used to help dissolve certain cysts.  HOME CARE INSTRUCTIONS   Follow your caregiver's advice regarding:   Medicine.   Follow up visits to evaluate and treat the cyst.   You may need to come back or make an appointment with another caregiver, to find the exact cause of your cyst, if your caregiver is not a gynecologist.   Get your yearly and recommended pelvic examinations and Pap tests.   Let your caregiver know if you have had an ovarian cyst in the past.  SEEK MEDICAL CARE IF:    Your periods are late, irregular, they stop, or are painful.   Your stomach (abdomen) or pelvic pain does not go away.   Your stomach becomes larger or swollen.   You have pressure on your bladder or trouble emptying your bladder completely.   You have painful sexual intercourse.   You have feelings of fullness, pressure, or discomfort in your stomach.   You lose weight for no apparent reason.   You feel generally ill.   You become constipated.   You lose your appetite.   You develop acne.   You have an increase in body and facial hair.   You are gaining weight, without changing your exercise and eating habits.   You think you are pregnant.  SEEK IMMEDIATE MEDICAL CARE IF:    You have increasing abdominal pain.   You feel sick to your stomach (nausea) and/or vomit.   You develop a fever that comes on suddenly.   You develop abdominal pain during a bowel movement.   Your menstrual periods become heavier than usual.  Document Released: 02/28/2005 Document Revised: 05/23/2011 Document Reviewed: 01/01/2009  [employer] Patient Information 2014 [employer].

## 2013-02-14 ENCOUNTER — Other Ambulatory Visit: Payer: Self-pay | Admitting: *Deleted

## 2013-02-14 ENCOUNTER — Other Ambulatory Visit: Payer: Self-pay | Admitting: Obstetrics & Gynecology

## 2013-02-14 ENCOUNTER — Encounter: Payer: Self-pay | Admitting: Obstetrics & Gynecology

## 2013-02-14 ENCOUNTER — Ambulatory Visit (INDEPENDENT_AMBULATORY_CARE_PROVIDER_SITE_OTHER): Payer: BC Managed Care – PPO | Admitting: Obstetrics & Gynecology

## 2013-02-14 ENCOUNTER — Ambulatory Visit (HOSPITAL_COMMUNITY)
Admission: RE | Admit: 2013-02-14 | Discharge: 2013-02-14 | Disposition: A | Discharge status: Home / Self Care | Payer: BC Managed Care – PPO | Source: Ambulatory Visit | Attending: Obstetrics & Gynecology | Admitting: Obstetrics & Gynecology

## 2013-02-14 ENCOUNTER — Encounter: Payer: Self-pay | Admitting: *Deleted

## 2013-02-14 VITALS — BP 114/80 | HR 67 | Temp 98.6°F | Ht 60.0 in | Wt 204.0 lb

## 2013-02-14 DIAGNOSIS — N83209 Unspecified ovarian cyst, unspecified side: Secondary | ICD-10-CM

## 2013-02-14 DIAGNOSIS — N926 Irregular menstruation, unspecified: Secondary | ICD-10-CM

## 2013-02-14 DIAGNOSIS — N834 Prolapse and hernia of ovary and fallopian tube, unspecified side: Secondary | ICD-10-CM

## 2013-02-14 DIAGNOSIS — N939 Abnormal uterine and vaginal bleeding, unspecified: Secondary | ICD-10-CM

## 2013-02-14 LAB — CBC
HCT: 39.6 % (ref 36.0–46.0)
Hemoglobin: 13.9 g/dL (ref 12.0–15.0)
MCH: 29.5 pg (ref 26.0–34.0)
MCHC: 35.1 g/dL (ref 30.0–36.0)
RBC: 4.71 MIL/uL (ref 3.87–5.11)

## 2013-02-14 MED ORDER — HYDROCODONE-ACETAMINOPHEN 10-325 MG PO TABS: 1.0000 | ORAL_TABLET | Freq: Four times a day (QID) | ORAL | Status: DC | PRN

## 2013-02-14 NOTE — Progress Notes (Signed)
Pt complains of RLQ pain ,states it feels like cramps   Subjective:     Chelsea Haley is a 37 y.o. female who presents for evaluation of abdominal pain.  She was recently prescribed an aromatase inhibitor for ovulation induction and developed a 4 cm simple/unilateral ovarian cyst.   Menstrual History: OB History   Grav Para Term Preterm Abortions TAB SAB Ect Mult Living                   Patient's last menstrual period was 02/01/2013.    The following portions of the patient's history were reviewed and updated as appropriate: allergies, current medications, past family history, past medical history, past social history, past surgical history and problem list.   Review of Systems Pertinent items are noted in HPI.    Objective:    BP 114/80  Pulse 67  Temp(Src) 98.6 F (37 C)  Ht 5' (1.524 m)  Wt 204 lb (92.534 kg)  BMI 39.84 kg/m2  LMP 02/01/2013 Abdomen: minimal RLQ tenderness, soft, ND, no rebound/guarding   Assessment:   Functional ovarian cyst--pain, AUB  Plan:   Orders Placed This Encounter  Procedures  . CBC  Pelvic U/S Analgesics Return in a few weeks

## 2013-02-17 ENCOUNTER — Encounter: Payer: Self-pay | Admitting: Obstetrics & Gynecology

## 2013-02-18 ENCOUNTER — Other Ambulatory Visit: Payer: Self-pay | Admitting: *Deleted

## 2013-02-18 DIAGNOSIS — N83209 Unspecified ovarian cyst, unspecified side: Secondary | ICD-10-CM

## 2013-02-18 MED ORDER — MELOXICAM 7.5 MG PO TABS: 7.5000 mg | ORAL_TABLET | Freq: Every day | ORAL | Status: DC

## 2013-02-21 ENCOUNTER — Ambulatory Visit (INDEPENDENT_AMBULATORY_CARE_PROVIDER_SITE_OTHER): Payer: BC Managed Care – PPO | Admitting: Obstetrics & Gynecology

## 2013-02-21 ENCOUNTER — Encounter: Payer: Self-pay | Admitting: Obstetrics & Gynecology

## 2013-02-21 VITALS — BP 128/87 | HR 59 | Temp 98.0°F | Ht 60.0 in | Wt 203.0 lb

## 2013-02-21 DIAGNOSIS — N83209 Unspecified ovarian cyst, unspecified side: Secondary | ICD-10-CM

## 2013-02-21 NOTE — Progress Notes (Signed)
Subjective:     Chelsea Haley is a 37 y.o. female here for a routine exam.  Current complaints: follow up to discuss test results. Pt recently had an ultrasound due to an ovarian cyst. Pt states she has stopped bleeding since her last visit. Pt states her pain is better. Pt states she is using the Mobic for her pain and it is helping.   Personal health questionnaire reviewed: yes.   Gynecologic History Patient's last menstrual period was 02/01/2013. Contraception: none   Obstetric History OB History  No data available     The following portions of the patient's history were reviewed and updated as appropriate: allergies, current medications, past family history, past medical history, past social history, past surgical history and problem list.  Review of Systems Pertinent items are noted in HPI.    Objective:    Abd: minimal tenderness   Assessment:    Ovarian cyst, less pain    Plan:    Return in 1 mth

## 2013-02-22 ENCOUNTER — Other Ambulatory Visit: Payer: BC Managed Care – PPO

## 2013-03-01 ENCOUNTER — Ambulatory Visit (INDEPENDENT_AMBULATORY_CARE_PROVIDER_SITE_OTHER): Payer: BC Managed Care – PPO | Admitting: Family

## 2013-03-01 ENCOUNTER — Encounter: Payer: Self-pay | Admitting: Family

## 2013-03-01 VITALS — BP 116/70 | HR 64 | Temp 97.8°F | Resp 16 | Ht 60.5 in | Wt 200.0 lb

## 2013-03-01 DIAGNOSIS — E282 Polycystic ovarian syndrome: Secondary | ICD-10-CM

## 2013-03-01 DIAGNOSIS — G43909 Migraine, unspecified, not intractable, without status migrainosus: Secondary | ICD-10-CM

## 2013-03-01 MED ORDER — METOPROLOL TARTRATE 25 MG PO TABS: 12.5000 mg | ORAL_TABLET | Freq: Two times a day (BID) | ORAL | Status: DC

## 2013-03-01 NOTE — Patient Instructions (Signed)
Please follow up in 3 months. Try to keep calories < 1200.

## 2013-03-01 NOTE — Assessment & Plan Note (Signed)
Change toprol xl to regular release toprol due to cost.

## 2013-03-01 NOTE — Progress Notes (Signed)
Subjective:    Patient ID: Chelsea Haley, female    DOB: [birth year], 37 y.o.   MRN: [id number]  HPI  Chelsea Haley is a 37 yr old female who presents today to discuss several concerns.   Hyperglycemia- reports that she was recently diagnosed with PCOS. She follows with Chelsea Haley.  Glucose readings on file have ranged from 78-81. Reports that she had a non-fasting blood sugar 176.  Reports that she has been counting calories.   Wt Readings from Last 3 Encounters:  03/01/13 200 lb (90.719 kg)  02/21/13 203 lb (92.08 kg)  02/14/13 204 lb (92.534 kg)   Obesity- Frustrated that she is not losing more weight. Keeping strict calorie log and keeping calories 1200-1500. Has lost 4 pounds since September.  Tobacco abuse- used chantix x 1 month. Stopped. Smoking 4 cigarettes a day  Migraines- she is maintained on toprol xl 25mg . Requesting regular release metoprolol due to cost.   Review of Systems    see HPI  Past Medical History  Diagnosis Date  . History of chicken pox   . Depression   . Migraines   . History of kidney stones   . History of seizure disorder     as a child. No medication since age 74.  Marland Kitchen Hyperlipidemia   . Hypertension   . Tachycardia   . History of frequent urinary tract infections   . GERD (gastroesophageal reflux disease)   . Binge eating   . Endometriosis   . Allergy     History   Social History  . Marital Status: Married    Spouse Name: N/A    Number of Children: 1  . Years of Education: N/A   Occupational History  .      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 Smoking status: Current Every Day Smoker -- 0.03 packs/day for 20 years    Types: Cigarettes    Last Attempt to Quit: 10/19/2011  . Smokeless tobacco: Never Used     Comment: 4 cigarettes daily  . Alcohol Use: 0.5 oz/week    1 drink(s) per week     Comment: occasional  . Drug Use: No  . Sexual Activity: Yes    Partners: Male   Other Topics Concern  . 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 Cholecystectomy  2006  . Cesarean section  2002  . Pilonidal cyst excision  2003  . Laporoscopy    . Wisdom tooth extraction Bilateral     Family History  Problem Relation Age of Onset  . Arthritis Father   . Hypertension Father   . Heart disease Maternal Grandmother     ICD  . Cancer Maternal Grandfather     lung  . Diabetes Neg Hx     Allergies  Allergen Reactions  . Adhesive [Tape]     Skin sensitivity  . Banana     migraine  . Morphine And Related Itching    hallucinations  . Other Itching    WALNUTS.  Gums itch. Cashews.   . Pertussis Vaccines     seizures  . Tramadol Anxiety    Current Outpatient Prescriptions on File  Prior to Visit  Medication Sig Dispense Refill  . acetaminophen (TYLENOL) 500 MG tablet Take 500 mg by mouth every 6 (six) hours as needed.      . Calcium Carbonate-Vitamin D (CALTRATE 600+D) 600-400 MG-UNIT per tablet Take 1 tablet by mouth daily.       . hydrochlorothiazide (HYDRODIURIL) 25 MG tablet Take 1 tablet (25 mg total) by mouth daily.  30 tablet  5  . HYDROcodone-acetaminophen (NORCO) 10-325 MG per tablet Take 1 tablet by mouth every 6 (six) hours as needed for moderate pain.  60 tablet  0  . meloxicam (MOBIC) 7.5 MG tablet Take 1 tablet (7.5 mg total) by mouth daily.  30 tablet  0  . ondansetron (ZOFRAN) 4 MG tablet Take 1 tablet (4 mg total) by mouth every 8 (eight) hours as needed for nausea.  20 tablet  0  . pantoprazole (PROTONIX) 40 MG tablet Take 1 tablet (40 mg total) by mouth daily.  90 tablet  3  . potassium chloride SA (K-DUR,KLOR-CON) 20 MEQ tablet Take one tablet by mouth one time daily  30 tablet  1  . prenatal vitamin w/FE, FA (NATACHEW) 29-1 MG CHEW chewable tablet Chew 1 tablet by  mouth daily at 12 noon.  90 tablet  3  . varenicline (CHANTIX CONTINUING MONTH PAK) 1 MG tablet Take 1 tablet (1 mg total) by mouth 2 (two) times daily.  60 tablet  1  . zolmitriptan (ZOMIG) 5 MG nasal solution Place 1 spray into the nose as needed for migraine.      . medroxyPROGESTERone (PROVERA) 10 MG tablet Take 2 tablets (20 mg total) by mouth 3 (three) times daily.  42 tablet  0   No current facility-administered medications on file prior to visit.    BP 116/70  Pulse 64  Temp(Src) 97.8 F (36.6 C) (Oral)  Resp 16  Ht 5' 0.5" (1.537 m)  Wt 200 lb (90.719 kg)  BMI 38.40 kg/m2  SpO2 98%  LMP 02/01/2013    Objective:   Physical Exam  Constitutional: She is oriented to person, place, and time. She appears well-developed and well-nourished. No distress.  HENT:  Head: Normocephalic and atraumatic.  Cardiovascular: Normal rate and regular rhythm.   No murmur heard. Pulmonary/Chest: Effort normal and breath sounds normal. No respiratory distress. She has no wheezes. She has no rales. She exhibits no tenderness.  Neurological: She is alert and oriented to person, place, and time.  Psychiatric: She has a normal mood and affect. Her behavior is normal. Judgment and thought content normal.          Assessment & Plan:  Tobacco abuse- urged tobacco cessation. Advised pt that chantix is preg category c and not to become pregnant while taking (gyn is prescribing)

## 2013-03-01 NOTE — Assessment & Plan Note (Addendum)
Management per GYN.   Lab Results  Component Value Date   HGBA1C 5.4 01/09/2013   Normal A1C. We discussed that she likely has some insulin resistance. Continue healthy diet. She is eating 1500 a day, not exercising due to pain from ovarian cyst.  Try to keep calories <1200 a day to help with weight loss.

## 2013-03-04 ENCOUNTER — Ambulatory Visit: Payer: BC Managed Care – PPO | Admitting: Family

## 2013-03-11 ENCOUNTER — Ambulatory Visit (INDEPENDENT_AMBULATORY_CARE_PROVIDER_SITE_OTHER): Payer: BC Managed Care – PPO | Admitting: Obstetrics & Gynecology

## 2013-03-11 ENCOUNTER — Encounter: Payer: Self-pay | Admitting: Obstetrics & Gynecology

## 2013-03-11 ENCOUNTER — Ambulatory Visit: Payer: BC Managed Care – PPO | Admitting: Obstetrics & Gynecology

## 2013-03-11 VITALS — BP 114/80 | HR 77 | Temp 98.6°F | Ht 60.0 in | Wt 198.0 lb

## 2013-03-11 DIAGNOSIS — R109 Unspecified abdominal pain: Secondary | ICD-10-CM

## 2013-03-11 LAB — CBC WITH DIFFERENTIAL/PLATELET
Basophils Absolute: 0 10*3/uL (ref 0.0–0.1)
Eosinophils Absolute: 0.2 10*3/uL (ref 0.0–0.7)
Eosinophils Relative: 2 % (ref 0–5)
HCT: 41.9 % (ref 36.0–46.0)
Hemoglobin: 14.6 g/dL (ref 12.0–15.0)
Lymphocytes Relative: 36 % (ref 12–46)
Lymphs Abs: 4 10*3/uL (ref 0.7–4.0)
MCH: 30 pg (ref 26.0–34.0)
MCV: 86 fL (ref 78.0–100.0)
Monocytes Absolute: 0.8 10*3/uL (ref 0.1–1.0)
Monocytes Relative: 7 % (ref 3–12)
Neutrophils Relative %: 55 % (ref 43–77)
RBC: 4.87 MIL/uL (ref 3.87–5.11)
WBC: 11 10*3/uL — ABNORMAL HIGH (ref 4.0–10.5)

## 2013-03-11 NOTE — Progress Notes (Signed)
Subjective:     Chelsea Haley is a 37 y.o. female here for a problem exam.  Current complaints: right dull constant lateral mid abd pain.  Personal health questionnaire reviewed: yes.   Gynecologic History Patient's last menstrual period was 02/01/2013. Contraception: none Last Pap: 07/12/2012. Results were: normal Last mammogram:  About 1999. Results were: normal  Obstetric History OB History  No data available     The following portions of the patient's history were reviewed and updated as appropriate: allergies, current medications, past family history, past medical history, past social history, past surgical history and problem list.  Review of Systems Pertinent items are noted in HPI.    Objective:     Abd: NT Informal U/S: normal adnexa     Assessment:   Ovarian cyst likely resolved  Plan:     Confirm findings with formal U/S

## 2013-03-12 ENCOUNTER — Ambulatory Visit (HOSPITAL_COMMUNITY)
Admission: RE | Admit: 2013-03-12 | Discharge: 2013-03-12 | Disposition: A | Discharge status: Home / Self Care | Payer: BC Managed Care – PPO | Source: Ambulatory Visit | Attending: Obstetrics & Gynecology | Admitting: Obstetrics & Gynecology

## 2013-03-12 ENCOUNTER — Other Ambulatory Visit: Payer: Self-pay | Admitting: Obstetrics & Gynecology

## 2013-03-12 ENCOUNTER — Other Ambulatory Visit: Payer: Self-pay | Admitting: *Deleted

## 2013-03-12 DIAGNOSIS — R1031 Right lower quadrant pain: Secondary | ICD-10-CM | POA: Diagnosis not present

## 2013-03-12 DIAGNOSIS — N83209 Unspecified ovarian cyst, unspecified side: Secondary | ICD-10-CM

## 2013-03-12 DIAGNOSIS — N926 Irregular menstruation, unspecified: Secondary | ICD-10-CM | POA: Insufficient documentation

## 2013-03-12 DIAGNOSIS — N834 Prolapse and hernia of ovary and fallopian tube, unspecified side: Secondary | ICD-10-CM

## 2013-03-13 ENCOUNTER — Ambulatory Visit: Payer: BC Managed Care – PPO | Admitting: Obstetrics & Gynecology

## 2013-03-26 ENCOUNTER — Other Ambulatory Visit: Payer: Self-pay | Admitting: Family

## 2013-03-28 ENCOUNTER — Ambulatory Visit (INDEPENDENT_AMBULATORY_CARE_PROVIDER_SITE_OTHER): Payer: BC Managed Care – PPO | Admitting: Obstetrics & Gynecology

## 2013-03-28 ENCOUNTER — Encounter: Payer: Self-pay | Admitting: Obstetrics & Gynecology

## 2013-03-28 VITALS — BP 128/87 | HR 76 | Temp 97.5°F | Ht 60.0 in | Wt 199.0 lb

## 2013-03-28 DIAGNOSIS — E282 Polycystic ovarian syndrome: Secondary | ICD-10-CM

## 2013-03-28 DIAGNOSIS — N970 Female infertility associated with anovulation: Secondary | ICD-10-CM

## 2013-03-28 NOTE — Progress Notes (Signed)
Subjective:     Chelsea Haley is a 38 y.o. female here for a routine exam.  Current complaints: Follow up for cyst. Patient stated she started her cycle yesterday and wanted to know should she start her femara.  Personal health questionnaire reviewed: yes.   Gynecologic History Patient's last menstrual period was 03/27/2013. Contraception: none   Obstetric History OB History  No data available     The following portions of the patient's history were reviewed and updated as appropriate: allergies, current medications, past family history, past medical history, past social history, past surgical history and problem list.  Review of Systems Pertinent items are noted in HPI.    Objective:     No exam today.     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

## 2013-04-13 ENCOUNTER — Other Ambulatory Visit: Payer: Self-pay | Admitting: Internal Medicine

## 2013-04-17 ENCOUNTER — Other Ambulatory Visit: Payer: BC Managed Care – PPO

## 2013-04-17 DIAGNOSIS — E282 Polycystic ovarian syndrome: Secondary | ICD-10-CM

## 2013-04-18 LAB — PROGESTERONE: Progesterone: 17.4 ng/mL

## 2013-05-10 ENCOUNTER — Encounter: Payer: Self-pay | Admitting: Obstetrics & Gynecology

## 2013-05-23 ENCOUNTER — Ambulatory Visit (INDEPENDENT_AMBULATORY_CARE_PROVIDER_SITE_OTHER): Payer: BC Managed Care – PPO | Admitting: Obstetrics & Gynecology

## 2013-05-23 VITALS — BP 134/88 | HR 79 | Wt 200.0 lb

## 2013-05-23 DIAGNOSIS — N970 Female infertility associated with anovulation: Secondary | ICD-10-CM

## 2013-05-23 DIAGNOSIS — N912 Amenorrhea, unspecified: Secondary | ICD-10-CM

## 2013-05-23 LAB — POCT URINE PREGNANCY: Preg Test, Ur: NEGATIVE

## 2013-05-23 NOTE — Progress Notes (Signed)
Subjective:     Chelsea Haley is a 38 y.o. female here for a follow up exam.  Current complaints: pt is here today for f/u ovulation with Femara.  Pt states that she has recently gotten pimple like bumps on pubic area. Personal health questionnaire reviewed: yes.   Gynecologic History Patient's last menstrual period was 04/26/2013. Contraception: none   Obstetric History OB History  No data available     The following portions of the patient's history were reviewed and updated as appropriate: allergies, current medications, past family history, past medical history, past social history, past surgical history and problem list.  Review of Systems Pertinent items are noted in HPI.    Objective:    Pelvic: scarring, nodularity   Assessment:  ?Folliculitis  Ovulatory on current dose of Femara  Plan:     Continue ovulation induction w/Femara Return in 6 weeks

## 2013-05-27 ENCOUNTER — Ambulatory Visit: Payer: BC Managed Care – PPO | Admitting: Family

## 2013-06-01 ENCOUNTER — Encounter: Payer: Self-pay | Admitting: Obstetrics & Gynecology

## 2013-06-24 ENCOUNTER — Other Ambulatory Visit: Payer: BC Managed Care – PPO

## 2013-06-24 DIAGNOSIS — Z3200 Encounter for pregnancy test, result unknown: Secondary | ICD-10-CM

## 2013-06-25 ENCOUNTER — Telehealth: Payer: Self-pay | Admitting: Cardiology

## 2013-06-25 LAB — HCG, QUANTITATIVE, PREGNANCY: hCG, Beta Chain, Quant, S: 540.1 m[IU]/mL

## 2013-06-25 NOTE — Telephone Encounter (Signed)
New message  Patient just found out she is pregnant and wants to know what to do about taking her Beta Blocker, Please call and advise.

## 2013-06-25 NOTE — Telephone Encounter (Signed)
Left message for pt to call.

## 2013-06-26 ENCOUNTER — Other Ambulatory Visit: Payer: BC Managed Care – PPO

## 2013-06-26 DIAGNOSIS — Z3200 Encounter for pregnancy test, result unknown: Secondary | ICD-10-CM

## 2013-06-26 DIAGNOSIS — Z112 Encounter for screening for other bacterial diseases: Secondary | ICD-10-CM

## 2013-06-26 LAB — HCG, QUANTITATIVE, PREGNANCY: hCG, Beta Chain, Quant, S: 1348.1 m[IU]/mL

## 2013-06-27 ENCOUNTER — Other Ambulatory Visit: Payer: Self-pay | Admitting: *Deleted

## 2013-06-27 DIAGNOSIS — O3680X0 Pregnancy with inconclusive fetal viability, not applicable or unspecified: Secondary | ICD-10-CM

## 2013-07-03 ENCOUNTER — Other Ambulatory Visit: Payer: Self-pay | Admitting: Obstetrics & Gynecology

## 2013-07-03 ENCOUNTER — Ambulatory Visit (INDEPENDENT_AMBULATORY_CARE_PROVIDER_SITE_OTHER): Payer: BC Managed Care – PPO

## 2013-07-03 DIAGNOSIS — O3680X0 Pregnancy with inconclusive fetal viability, not applicable or unspecified: Secondary | ICD-10-CM

## 2013-07-03 DIAGNOSIS — O09529 Supervision of elderly multigravida, unspecified trimester: Secondary | ICD-10-CM

## 2013-07-04 ENCOUNTER — Encounter: Payer: Self-pay | Admitting: Obstetrics & Gynecology

## 2013-07-04 LAB — US OB COMP LESS 14 WKS

## 2013-07-05 ENCOUNTER — Encounter: Payer: Self-pay | Admitting: Obstetrics & Gynecology

## 2013-07-05 LAB — US OB COMP LESS 14 WKS

## 2013-07-31 NOTE — Telephone Encounter (Signed)
Spoke with pt, she has stopped the metoprolol. She has had a couple episodes of palpitations that have not been bad. Follow up scheduled

## 2013-08-07 ENCOUNTER — Telehealth: Payer: Self-pay | Admitting: *Deleted

## 2013-08-07 NOTE — Telephone Encounter (Signed)
Patient states she is only [redacted] weeks pregnant and is having a lot of swelling. Patient states she can no longer wear her rings and she has a lot of swelling in her legs and hands. Patient was on a fluid pill before she got pregnant and she just wants some advise- she was not expecting the swelling this quickly. May leave message on her VM.

## 2013-08-08 NOTE — Telephone Encounter (Signed)
Needs appointment

## 2013-08-09 NOTE — Telephone Encounter (Signed)
Printed and forwarded to front staff.

## 2013-08-14 ENCOUNTER — Ambulatory Visit (INDEPENDENT_AMBULATORY_CARE_PROVIDER_SITE_OTHER): Payer: BC Managed Care – PPO | Admitting: Obstetrics & Gynecology

## 2013-08-14 ENCOUNTER — Encounter: Payer: Self-pay | Admitting: Obstetrics & Gynecology

## 2013-08-14 VITALS — BP 124/76 | HR 98 | Temp 98.0°F | Wt 198.0 lb

## 2013-08-14 DIAGNOSIS — Z720 Tobacco use: Secondary | ICD-10-CM

## 2013-08-14 DIAGNOSIS — Z3480 Encounter for supervision of other normal pregnancy, unspecified trimester: Secondary | ICD-10-CM

## 2013-08-14 DIAGNOSIS — F17200 Nicotine dependence, unspecified, uncomplicated: Secondary | ICD-10-CM

## 2013-08-14 DIAGNOSIS — O34219 Maternal care for unspecified type scar from previous cesarean delivery: Secondary | ICD-10-CM

## 2013-08-14 DIAGNOSIS — I471 Supraventricular tachycardia: Secondary | ICD-10-CM

## 2013-08-14 DIAGNOSIS — O0990 Supervision of high risk pregnancy, unspecified, unspecified trimester: Secondary | ICD-10-CM | POA: Insufficient documentation

## 2013-08-14 DIAGNOSIS — O3421 Maternal care for scar from previous cesarean delivery: Secondary | ICD-10-CM

## 2013-08-15 LAB — OBSTETRIC PANEL
ANTIBODY SCREEN: NEGATIVE
Basophils Absolute: 0 10*3/uL (ref 0.0–0.1)
Basophils Relative: 0 % (ref 0–1)
Eosinophils Absolute: 0.1 10*3/uL (ref 0.0–0.7)
Eosinophils Relative: 1 % (ref 0–5)
HEMATOCRIT: 35.1 % — AB (ref 36.0–46.0)
HEMOGLOBIN: 12.3 g/dL (ref 12.0–15.0)
Hepatitis B Surface Ag: NEGATIVE
LYMPHS ABS: 2.8 10*3/uL (ref 0.7–4.0)
LYMPHS PCT: 26 % (ref 12–46)
MCH: 30 pg (ref 26.0–34.0)
MCHC: 35 g/dL (ref 30.0–36.0)
MCV: 85.6 fL (ref 78.0–100.0)
MONO ABS: 0.5 10*3/uL (ref 0.1–1.0)
MONOS PCT: 5 % (ref 3–12)
NEUTROS ABS: 7.3 10*3/uL (ref 1.7–7.7)
Neutrophils Relative %: 68 % (ref 43–77)
Platelets: 332 10*3/uL (ref 150–400)
RBC: 4.1 MIL/uL (ref 3.87–5.11)
RDW: 13.1 % (ref 11.5–15.5)
RUBELLA: 0.85 {index} (ref <0.90)
Rh Type: POSITIVE
WBC: 10.8 10*3/uL — AB (ref 4.0–10.5)

## 2013-08-15 LAB — VITAMIN D 25 HYDROXY (VIT D DEFICIENCY, FRACTURES): Vit D, 25-Hydroxy: 52 ng/mL (ref 30–89)

## 2013-08-15 LAB — VARICELLA ZOSTER ANTIBODY, IGG: Varicella IgG: 614.8 Index — ABNORMAL HIGH (ref <135.00)

## 2013-08-15 LAB — HEMOGLOBIN A1C
HEMOGLOBIN A1C: 5.3 % (ref <5.7)
MEAN PLASMA GLUCOSE: 105 mg/dL (ref <117)

## 2013-08-15 LAB — HIV ANTIBODY (ROUTINE TESTING W REFLEX): HIV 1&2 Ab, 4th Generation: NONREACTIVE

## 2013-08-16 LAB — PAP IG, CT-NG NAA, HPV HIGH-RISK
Chlamydia Probe Amp: NEGATIVE
GC Probe Amp: NEGATIVE
HPV DNA High Risk: NOT DETECTED

## 2013-08-16 LAB — CULTURE, OB URINE
COLONY COUNT: NO GROWTH
Organism ID, Bacteria: NO GROWTH

## 2013-08-18 DIAGNOSIS — O34219 Maternal care for unspecified type scar from previous cesarean delivery: Secondary | ICD-10-CM | POA: Insufficient documentation

## 2013-08-18 DIAGNOSIS — I471 Supraventricular tachycardia: Secondary | ICD-10-CM | POA: Insufficient documentation

## 2013-08-18 NOTE — Patient Instructions (Signed)
Prenatal Care  °WHAT IS PRENATAL CARE?  °Prenatal care means health care during your pregnancy, before your baby is born. It is very important to take care of yourself and your baby during your pregnancy by:  °· Getting early prenatal care. If you know you are pregnant, or think you might be pregnant, call your health care provider as soon as possible. Schedule a visit for a prenatal exam. °· Getting regular prenatal care. Follow your health care provider's schedule for blood and other necessary tests. Do not miss appointments. °· Doing everything you can to keep yourself and your baby healthy during your pregnancy. °· Getting complete care. Prenatal care should include evaluation of the medical, dietary, educational, psychological, and social needs of you and your significant other. The medical and genetic history of your family and the family of your baby's father should be discussed with your health care provider. °· Discussing with your health care provider: °· Prescription, over-the-counter, and herbal medicines that you take. °· Any history of substance abuse, alcohol use, smoking, and illegal drug use. °· Any history of domestic abuse and violence. °· Immunizations you have received. °· Your nutrition and diet. °· The amount of exercise you do. °· Any environmental and occupational hazards to which you are exposed. °· History of sexually transmitted infections for both you and your partner. °· Previous pregnancies you have had. °WHY IS PRENATAL CARE SO IMPORTANT?  °By regularly seeing your health care provider, you help ensure that problems can be identified early so that they can be treated as soon as possible. Other problems might be prevented. Many studies have shown that early and regular prenatal care is important for the health of mothers and their babies.  °HOW CAN I TAKE CARE OF MYSELF WHILE I AM PREGNANT?  °Here are ways to take care of yourself and your baby:  °· Start or continue taking your  multivitamin with 400 micrograms (mcg) of folic acid every day. °· Get early and regular prenatal care. It is very important to see a health care provider during your pregnancy. Your health care provider will check at each visit to make sure that you and the baby are healthy. If there are any problems, action can be taken right away to help you and the baby. °· Eat a healthy diet that includes: °· Fruits. °· Vegetables. °· Foods low in saturated fat. °· Whole grains. °· Calcium-rich foods, such as milk, yogurt, and hard cheeses. °· Drink 6 to 8 glasses of liquids a day. °· Unless your health care provider tells you not to, try to be physically active for 30 minutes, most days of the week. If you are pressed for time, you can get your activity in through 10-minute segments, three times a day. °· Do not smoke, drink alcohol, or use drugs. These can cause long-term damage to your baby. Talk with your health care provider about steps to take to stop smoking. Talk with a member of your faith community, a counselor, a trusted friend, or your health care provider if you are concerned about your alcohol or drug use. °· Ask your health care provider before taking any medicine, even over-the-counter medicines. Some medicines are not safe to take during pregnancy. °· Get plenty of rest and sleep. °· Avoid hot tubs and saunas during pregnancy. °· Do not have X-rays taken unless absolutely necessary and with the recommendation of your health care provider. A lead shield can be placed on your abdomen to protect the   baby when X-rays are taken in other parts of the body. °· Do not empty the cat litter when you are pregnant. It may contain a parasite that causes an infection called toxoplasmosis, which can cause birth defects. Also, use gloves when working in garden areas used by cats. °· Do not eat uncooked or undercooked meats or fish. °· Do not eat soft, mold-ripened cheeses (Brie, Camembert, and chevre) or soft, blue-veined  cheese (Danish blue and Roquefort). °· Stay away from toxic chemicals like: °· Insecticides. °· Solvents (some cleaners or paint thinners). °· Lead. °· Mercury. °· Sexual intercourse may continue until the end of the pregnancy, unless you have a medical problem or there is a problem with the pregnancy and your health care provider tells you not to. °· Do not wear high-heel shoes, especially during the second half of the pregnancy. You can lose your balance and fall. °· Do not take long trips, unless absolutely necessary. Be sure to see your health care provider before going on the trip. °· Do not sit in one position for more than 2 hours when on a trip. °· Take a copy of your medical records when going on a trip. Know where a hospital is located in the city you are visiting, in case of an emergency. °· Most dangerous household products will have pregnancy warnings on their labels. Ask your health care provider about products if you are unsure. °· Limit or eliminate your caffeine intake from coffee, tea, sodas, medicines, and chocolate. °· Many women continue working through pregnancy. Staying active might help you stay healthier. If you have a question about the safety or the hours you work at your particular job, talk with your health care provider. °· Get informed: °· Read books. °· Watch videos. °· Go to childbirth classes for you and your significant other. °· Talk with experienced moms. °· Ask your health care provider about childbirth education classes for you and your partner. Classes can help you and your partner prepare for the birth of your baby. °· Ask about a baby doctor (pediatrician) and methods and pain medicine for labor, delivery, and possible cesarean delivery. °HOW OFTEN SHOULD I SEE MY HEALTH CARE PROVIDER DURING PREGNANCY?  °Your health care provider will give you a schedule for your prenatal visits. You will have visits more often as you get closer to the end of your pregnancy. An average  pregnancy lasts about 40 weeks.  °A typical schedule includes visiting your health care provider:  °· About once each month during your first 6 months of pregnancy. °· Every 2 weeks during the next 2 months. °· Weekly in the last month, until the delivery date. °Your health care provider will probably want to see you more often if: °· You are older than 35 years. °· Your pregnancy is high risk because you have certain health problems or problems with the pregnancy, such as: °· Diabetes. °· High blood pressure. °· The baby is not growing on schedule, according to the dates of the pregnancy. °Your health care provider will do special tests to make sure you and the baby are not having any serious problems. °WHAT HAPPENS DURING PRENATAL VISITS?  °· At your first prenatal visit, your health care provider will do a physical exam and talk to you about your health history and the health history of your partner and your family. Your health care provider will be able to tell you what date to expect your baby to be born on. °·   Your first physical exam will include checks of your blood pressure, measurements of your height and weight, and an exam of your pelvic organs. Your health care provider will do a Pap test if you have not had one recently and will do cultures of your cervix to make sure there is no infection. °· At each prenatal visit, there will be tests of your blood, urine, blood pressure, weight, and checking the progress of the baby. °· At your later prenatal visits, your health care provider will check how you are doing and how the baby is developing. You may have a number of tests done as your pregnancy progresses. °· Ultrasound exams are often used to check on the baby's growth and health. °· You may have more urine and blood tests, as well as special tests, if needed. These may include amniocentesis to examine fluid in the pregnancy sac, stress tests to check how the baby responds to contractions, or a  biophysical profile to measure fetus well-being. Your health care provider will explain the tests and why they are necessary. °· You should discuss with your health care provider your plans to breastfeed or bottle-feed your baby. °· Each visit is also a chance for you to learn about staying healthy during pregnancy and to ask questions. °Document Released: 03/03/2003 Document Revised: 12/19/2012 Document Reviewed: 08/16/2012 °[employer]® Patient Information ©2014 [employer]. ° °

## 2013-08-18 NOTE — Progress Notes (Signed)
Subjective:    Chelsea Haley is being seen today for her first obstetrical visit.  This is a planned pregnancy. She is at [redacted]w[redacted]d gestation. Her obstetrical history is significant for advanced maternal age and h/o PCOS;paroxysmal nodal tachycardia . Pregnancy history fully reviewed.    Menstrual History OB History   Grav Para Term Preterm Abortions TAB SAB Ect Mult Living   2 1 0 1 0 0 0 0 0 1        No LMP recorded. Patient is pregnant.    Past Medical History  Diagnosis Date  . History of chicken pox   . Depression   . Migraines   . History of kidney stones   . History of seizure disorder     as a child. No medication since age 53.  Marland Kitchen Hyperlipidemia   . Hypertension   . Tachycardia   . History of frequent urinary tract infections   . GERD (gastroesophageal reflux disease)   . Binge eating   . Endometriosis   . Allergy     Past Surgical History  Procedure Laterality Date  . Cholecystectomy  2006  . Cesarean section  2002  . Pilonidal cyst excision  2003  . Laporoscopy    . Wisdom tooth extraction Bilateral      (Not in a hospital admission) Allergies  Allergen Reactions  . Adhesive [Tape]     Skin sensitivity  . Banana     migraine  . Morphine And Related Itching    hallucinations  . Other Itching    WALNUTS.  Gums itch. Cashews.   . Pertussis Vaccines     seizures  . Tramadol Anxiety    History  Substance Use Topics  . Smoking status: Current Some Day Smoker -- 0.03 packs/day for 20 years    Types: Cigarettes    Last Attempt to Quit: 10/19/2011  . Smokeless tobacco: Never Used     Comment: 4 cigarettes daily  . Alcohol Use: No    Family History  Problem Relation Age of Onset  . Arthritis Father   . Hypertension Father   . Heart disease Maternal Grandmother     ICD  . Cancer Maternal Grandfather     lung  . Diabetes Neg Hx      Review of Systems Constitutional: negative for weight loss Gastrointestinal: negative for  vomiting Genitourinary:negative for genital lesions and vaginal discharge and dysuria Musculoskeletal:negative for back pain Behavioral/Psych: negative for abusive relationship, depression, illegal drug usage and tobacco use    Objective:     General Appearance:    Alert, cooperative, no distress, appears stated age  Head:    Normocephalic, without obvious abnormality, atraumatic  Eyes:    PERRL, conjunctiva/corneas clear, EOM's intact, fundi    benign, both eyes  Ears:    Normal TM's and external ear canals, both ears  Nose:   Nares normal, septum midline, mucosa normal, no drainage    or sinus tenderness  Throat:   Lips, mucosa, and tongue normal; teeth and gums normal  Neck:   Supple, symmetrical, trachea midline, no adenopathy;    thyroid:  no enlargement/tenderness/nodules; no carotid   bruit or JVD  Back:     Symmetric, no curvature, ROM normal, no CVA tenderness  Lungs:     Clear to auscultation bilaterally, respirations unlabored  Chest Wall:    No tenderness or deformity   Heart:    Regular rate and rhythm, S1 and S2 normal, no murmur, rub  or gallop  Breast Exam:    No tenderness, masses, or nipple abnormality  Abdomen:     Soft, non-tender, bowel sounds active all four quadrants,    no masses, no organomegaly  Genitalia:    Normal female without lesion, discharge or tenderness  Extremities:   Extremities normal, atraumatic, no cyanosis or edema  Pulses:   2+ and symmetric all extremities  Skin:   Skin color, texture, turgor normal, no rashes or lesions  Lymph nodes:   Cervical, supraclavicular, and axillary nodes normal  Neurologic:   CNII-XII intact, normal strength, sensation and reflexes    throughout      Lab Review Urine pregnancy test Labs reviewed no Radiologic studies reviewed no Assessment:    Pregnancy at [redacted]w[redacted]d weeks    Plan:      Prenatal vitamins.  Counseling provided regarding continued use of seat belts, cessation of alcohol consumption,  smoking or use of illicit drugs; infection precautions i.e., influenza/TDAP immunizations, toxoplasmosis,CMV, parvovirus, listeria and varicella; workplace safety, exercise during pregnancy; routine dental care, safe medications, sexual activity, hot tubs, saunas, pools, travel, caffeine use, fish and methlymercury, potential toxins, hair treatments, varicose veins Weight gain recommendations per IOM guidelines reviewed:obese/BMI >30->gain  11 - 20 lbs Problem list reviewed and updated. FIRST/CF mutation testing/NIPT/QUAD SCREEN/fragile X/Spinal muscular atrophy discussed. Role of ultrasound in pregnancy discussed. Amniocentesis discussed. VBAC calculator score: VBAC consent form provided Meds ordered this encounter  Medications  . famotidine (PEPCID) 10 MG tablet    Sig: Take 10 mg by mouth 2 (two) times daily.  . metoprolol tartrate (LOPRESSOR) 25 MG tablet    Sig:    Orders Placed This Encounter  Procedures  . Culture, OB Urine  . Obstetric panel  . HIV antibody  . Varicella zoster antibody, IgG  . Vit D  25 hydroxy (rtn osteoporosis monitoring)  . Hemoglobin A1c  . POCT urinalysis dipstick   Discontinue Metformin-->2 hr GTT Referral-->MFM, Genetics counseling Follow up in 4 weeks.

## 2013-08-19 ENCOUNTER — Other Ambulatory Visit: Payer: Self-pay | Admitting: *Deleted

## 2013-08-19 DIAGNOSIS — O0990 Supervision of high risk pregnancy, unspecified, unspecified trimester: Secondary | ICD-10-CM

## 2013-08-19 LAB — POCT URINALYSIS DIPSTICK
Bilirubin, UA: NEGATIVE
Blood, UA: NEGATIVE
Glucose, UA: NEGATIVE
KETONES UA: NEGATIVE
LEUKOCYTES UA: NEGATIVE
Nitrite, UA: NEGATIVE
Protein, UA: NEGATIVE
Spec Grav, UA: 1.01
Urobilinogen, UA: NEGATIVE
pH, UA: 7

## 2013-08-20 ENCOUNTER — Telehealth: Payer: Self-pay | Admitting: Internal Medicine

## 2013-08-20 ENCOUNTER — Other Ambulatory Visit: Payer: Self-pay | Admitting: *Deleted

## 2013-08-20 DIAGNOSIS — IMO0002 Reserved for concepts with insufficient information to code with codable children: Secondary | ICD-10-CM

## 2013-08-20 DIAGNOSIS — O0990 Supervision of high risk pregnancy, unspecified, unspecified trimester: Secondary | ICD-10-CM

## 2013-08-20 DIAGNOSIS — D1803 Hemangioma of intra-abdominal structures: Secondary | ICD-10-CM

## 2013-08-20 NOTE — Telephone Encounter (Signed)
Left message for patient to call back [place] scheduled for 08/29/13 8:30

## 2013-08-20 NOTE — Telephone Encounter (Signed)
Patient is 12 1/[redacted] weeks pregnant.  She has 3 small hemangiomas in the liver first noted in 2007 then again on a scan from 2013.  Her ob is asking does she need to have this monitored during pregnancy or after.  She has not had any liver imaging since 2013.  Please advise

## 2013-08-20 NOTE — Telephone Encounter (Signed)
Patient notified of recommendations and [place] scheduled for 6/18.  She verbalized understanding of all instrucitons

## 2013-08-20 NOTE — Telephone Encounter (Signed)
It is felt that hemangiomas can be managed conservatively during pregnancy and vaginal delivery should be fine. Complications from lesions under 10 cm are rare Would repeat liver US to ensure previously seen hemangiomas have not rapidly increased in size

## 2013-08-21 ENCOUNTER — Other Ambulatory Visit (HOSPITAL_COMMUNITY): Payer: BC Managed Care – PPO

## 2013-08-22 ENCOUNTER — Ambulatory Visit (HOSPITAL_COMMUNITY)
Admission: RE | Admit: 2013-08-22 | Discharge: 2013-08-22 | Disposition: A | Discharge status: Home / Self Care | Payer: BC Managed Care – PPO | Source: Ambulatory Visit | Attending: Obstetrics & Gynecology | Admitting: Obstetrics & Gynecology

## 2013-08-22 ENCOUNTER — Encounter (HOSPITAL_COMMUNITY): Payer: Self-pay

## 2013-08-22 ENCOUNTER — Other Ambulatory Visit: Payer: Self-pay

## 2013-08-22 ENCOUNTER — Ambulatory Visit: Payer: BC Managed Care – PPO | Admitting: Obstetrics & Gynecology

## 2013-08-22 ENCOUNTER — Ambulatory Visit (HOSPITAL_COMMUNITY): Payer: BC Managed Care – PPO

## 2013-08-22 VITALS — BP 128/75 | HR 77 | Wt 198.0 lb

## 2013-08-22 DIAGNOSIS — O09529 Supervision of elderly multigravida, unspecified trimester: Secondary | ICD-10-CM | POA: Insufficient documentation

## 2013-08-22 DIAGNOSIS — Z8742 Personal history of other diseases of the female genital tract: Secondary | ICD-10-CM | POA: Insufficient documentation

## 2013-08-22 DIAGNOSIS — O99330 Smoking (tobacco) complicating pregnancy, unspecified trimester: Secondary | ICD-10-CM | POA: Diagnosis not present

## 2013-08-22 DIAGNOSIS — O0990 Supervision of high risk pregnancy, unspecified, unspecified trimester: Secondary | ICD-10-CM | POA: Diagnosis not present

## 2013-08-22 DIAGNOSIS — O99891 Other specified diseases and conditions complicating pregnancy: Secondary | ICD-10-CM | POA: Diagnosis present

## 2013-08-22 DIAGNOSIS — O99350 Diseases of the nervous system complicating pregnancy, unspecified trimester: Secondary | ICD-10-CM

## 2013-08-22 DIAGNOSIS — O34219 Maternal care for unspecified type scar from previous cesarean delivery: Secondary | ICD-10-CM

## 2013-08-22 DIAGNOSIS — O9989 Other specified diseases and conditions complicating pregnancy, childbirth and the puerperium: Principal | ICD-10-CM

## 2013-08-22 DIAGNOSIS — Z3689 Encounter for other specified antenatal screening: Secondary | ICD-10-CM

## 2013-08-22 DIAGNOSIS — I479 Paroxysmal tachycardia, unspecified: Secondary | ICD-10-CM | POA: Diagnosis not present

## 2013-08-22 DIAGNOSIS — G40909 Epilepsy, unspecified, not intractable, without status epilepticus: Secondary | ICD-10-CM | POA: Diagnosis not present

## 2013-08-22 DIAGNOSIS — IMO0002 Reserved for concepts with insufficient information to code with codable children: Secondary | ICD-10-CM | POA: Insufficient documentation

## 2013-08-22 DIAGNOSIS — I471 Supraventricular tachycardia: Secondary | ICD-10-CM

## 2013-08-22 DIAGNOSIS — O283 Abnormal ultrasonic finding on antenatal screening of mother: Secondary | ICD-10-CM

## 2013-08-22 NOTE — Progress Notes (Signed)
[facility] ultrasound  Indication: 38 yr old [id number] at 19w6dwith previous preterm delivery and paroxysmal tachycardia for first trimester screen.  Findings: 1. Single intrauterine pregnancy. 2. Fetal crown rump length is consistent with dating. 3. Normal uterus; no adnexal masses seen. 4. Evaluation of fetal anatomy is limited by early gestational age. 5. The nuchal translucency is enlarged at 3.766m 6. The nasal bone appears absent.  Recommendations: 1. Appropriate dating. 2. Thickened nuchal translucency: - discussed association with increased risk of aneuploidy, congenital anomalies (especially cardiac), and fetal loss - patient met with the genetic counselor; see separate report - after counseling patient opted for cell free fetal DNA which was drawn today 3. Recommend maternal serum AFP at 15-20 weeks 4. Recommend fetal anatomic survey at 18-20 weeks 5. Recommend fetal echocardiogram at 18-22 weeks 6. Previous preterm delivery: - no consult requested - unclear if it was spontaneous or indicated 7. Paroxysmal tachycardia: - no consult requested - patient followed by Cardiology 8. Absent nasal bone: - patient counseled on association with increased risk of trisomy [id number]

## 2013-08-23 ENCOUNTER — Encounter: Payer: Self-pay | Admitting: Obstetrics & Gynecology

## 2013-08-23 DIAGNOSIS — O351XX0 Maternal care for (suspected) chromosomal abnormality in fetus, not applicable or unspecified: Secondary | ICD-10-CM | POA: Insufficient documentation

## 2013-08-23 DIAGNOSIS — IMO0002 Reserved for concepts with insufficient information to code with codable children: Secondary | ICD-10-CM | POA: Insufficient documentation

## 2013-08-23 NOTE — Progress Notes (Signed)
Genetic Counseling  High-Risk Gestation Note  Appointment Date:  08/22/2013 Referred By: Lahoma Crocker, MD Date of Birth:  1976-01-03 Partner:  Karle Starch   Pregnancy History: [id number] Estimated Date of Delivery: 02/28/14 Estimated Gestational Age: [redacted]w[redacted]d Attending: [place], MD   Ms. Woodville and her partner were seen for genetic counseling because of a maternal age of 38 y.o. and given that ultrasound today visualized increased nuchal translucency and absent nasal bone.   Ultrasound performed at the time of today's visit visualized the pregnancy to be [redacted]w[redacted]d gestation. The nuchal translucency appeared enlarged, measuring 3.7 mm, and the nasal bone appeared to be absent. Complete ultrasound results reported separately.    They were counseled regarding maternal age and the association with risk for chromosome conditions due to nondisjunction with aging of the ova.   We reviewed chromosomes, nondisjunction, and the associated 1 in 18 risk for fetal aneuploidy related to a maternal age of 38 y.o. at [redacted]w[redacted]d gestation.  She was counseled that the risk for aneuploidy decreases as gestational age increases, accounting for those pregnancies which spontaneously abort.  We specifically discussed Down syndrome (trisomy 85), trisomies 36 and 8, and sex chromosome aneuploidies (47,XXX and 47,XXY) including the common features and prognoses of each.   We discussed that the fetal NT refers to a fluid filled space between the skin and soft tissues behind the cervical spine.  This space is traditionally measurable between 11 and 13.[redacted] weeks gestation and is considered enlarged at ~3 mm or greater. This couple was counseled regarding the various common etiologies for an enlarged NT including: aneuploidy, single gene conditions, cardiac or great vessel abnormalities, lymphatic system failure, decreased fetal movement, and fetal anemia.    We reviewed chromosomes, nondisjunction, and the common features  and prognoses of Down syndrome and other aneuploidies.  We discussed that the finding of increase nuchal translucency increases the risk for fetal aneuploidy above the patient's age related risk. The couple was counseled that the ultrasound finding of an absent nasal bone is a highly sensitive and specific marker for Down syndrome.  It is present in approximately 1% of chromosomally normal fetuses, but up to 70% of fetuses with Down syndrome, 55 % with trisomy 18, and 34% with trisomy 13. The nasal bone is typically considered to be hypoplastic if it is less than 2.5 mm in length.  The length of the fetal nasal bone varies by race and ethnicity.  The associated risk of aneuploidy is highest in Caucasians, with a LR of ~50.  Given Mrs. Buren maternal age of 38 y.o., we discussed the approximate 1 in 39 risk for fetal Down syndrome.  Considering the ultrasound finding of a absent nasal bone, the adjusted risk for fetal Down syndrome is ~40%.  In addition, we discussed that an NT of 3.7 is associated with an approximate 3% risk for a fetal cardiac anomaly.  We also discussed single gene conditions and that these conditions are not routinely tested for prenatally unless ultrasound findings or family history significantly increase the suspicion of a specific single gene disorder.  We briefly reviewed common inheritance patterns (dominant, recessive, and X-linked) as well as the associated risks of recurrence.     We reviewed available screening options including First Screen, noninvasive prenatal screening (NIPS)/cell free DNA (cfDNA) testing, and detailed ultrasound.  They were counseled that screening tests are used to modify a patient's a priori risk for aneuploidy, typically based on age. This estimate provides a pregnancy specific risk assessment.  We reviewed the benefits and limitations of each option. Specifically, we discussed the conditions for which each test screens, the detection rates, and false  positive rates of each. They were also counseled regarding diagnostic testing via CVS and amniocentesis. We reviewed the approximate 1 in 099 risk for complications for CVS and the approximate 1 in 833-825 risk for complications for amniocentesis, including spontaneous pregnancy loss. After consideration of all the options, they elected to proceed with NIPS (Panorama) at the time of today's visit, including screening for 22q11 deletion syndrome.  Those results will be available in 8-10 days. Ms. Faughnan stated that she is not interested in invasive, diagnostic testing for fetal aneuploidy in the pregnancy. She declined amniocentesis. We discussed the option of detailed ultrasound at ~18 weeks and fetal echocardiogram, which were both scheduled for 10/04/13.  They were counseled that the fetal prognosis depends on the underlying etiology of the enlarged NT and further anticipatory guidance can be provided if a diagnosis is discovered.  They understand that screening tests cannot rule out all birth defects or genetic syndromes. The patient was advised of this limitation and states she still does not want additional testing at this time.   We discussed information regarding cystic fibrosis (CF) including the carrier frequency and incidence in the Caucasian population, the availability of carrier testing and prenatal diagnosis if indicated.  In addition, we discussed that CF is routinely screened for as part of the [place] newborn screening panel.  Ms. Backs reported that CF carrier screening was performed during her first pregnancy and was negative. We do not have documentation of this result, but given the reported information, her risk to be a CF carrier has been reduced.    Both family histories were reviewed and found to be noncontributory for birth defects, intellectual disabilities, and known genetic conditions. Ms. Gasior reported that her son, with a previous partner, has postural tachycardia syndrome (POTS). There are  various causes reported for POTS. Though underlying genetic causes have been reported, the majority of cases do not have an underlying genetic component. It would be helpful for the couple's pediatrician to be aware of this history so that their child can be screened appropriately.   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 Epilepsy occurs in approximately 1% of the population and can have many causes.  Approximately 80% of epilepsy is thought to be idiopathic while the remaining 20% is secondary to a variety of factors such as perinatal events, infections, trauma and genetic disease.  A specific diagnosis in an affected individual is necessary to accurately assess the risk for other family members to develop epilepsy.  In the absence of a known etiology, epilepsy is thought to be caused by a combination of genetic and environmental factors, called multifactorial inheritance. Recurrence risk for epilepsy is estimated to be 4% for offspring of an individual with primary idiopathic epilepsy. In the case that epilepsy is due to an environmental factor, as Ms. Masters states is the case for her, then recurrence risk for relatives would not be increased for relatives. Without further information regarding the provided family history, an accurate genetic risk cannot be calculated. Further genetic counseling is warranted if more information is obtained.   Ms. Shianne Zeiser Liaw denied exposure to environmental toxins or chemical agents. She denied the use of alcohol or street drugs. She reported smoking approximately a half pack of cigarettes per day. The associations of smoking  in pregnancy were reviewed and cessation encouraged. She denied significant viral illnesses during the course of her pregnancy. 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 She is followed regularly by neurology and reports her last seizure was at age 21 years old. She does not take medication for epilepsy. Additionally, she reported a history of 3 liver hemangiomas, and reportedly has a follow-up ultrasound next week to reassess these. She also reported a history of endometriosis and chronic kidney infections. Maternal Fetal Medicine consultation was not requested by the primary provider at the time of today's visit.    I counseled this couple regarding the above risks and available options.  The approximate face-to-face time with the genetic counselor was 45 minutes.  Chipper Oman, MS,  Certified Genetic Counselor 08/23/2013

## 2013-08-26 ENCOUNTER — Telehealth: Payer: Self-pay | Admitting: *Deleted

## 2013-08-26 NOTE — Telephone Encounter (Signed)
Patient wants to discuss her results from her screening at hospital. Patient request Dr Delsa Sale call her.

## 2013-08-28 ENCOUNTER — Encounter: Payer: BC Managed Care – PPO | Admitting: Obstetrics

## 2013-08-28 VITALS — BP 132/88 | HR 92 | Temp 98.2°F | Wt 194.0 lb

## 2013-08-28 DIAGNOSIS — O0990 Supervision of high risk pregnancy, unspecified, unspecified trimester: Secondary | ICD-10-CM

## 2013-08-28 LAB — CBC
HCT: 33.8 % — ABNORMAL LOW (ref 36.0–46.0)
HEMOGLOBIN: 11.7 g/dL — AB (ref 12.0–15.0)
MCH: 29.4 pg (ref 26.0–34.0)
MCHC: 34.6 g/dL (ref 30.0–36.0)
MCV: 84.9 fL (ref 78.0–100.0)
PLATELETS: 323 10*3/uL (ref 150–400)
RBC: 3.98 MIL/uL (ref 3.87–5.11)
RDW: 13.5 % (ref 11.5–15.5)
WBC: 10.9 10*3/uL — ABNORMAL HIGH (ref 4.0–10.5)

## 2013-08-28 NOTE — Telephone Encounter (Signed)
Dr Delsa Sale attempted to call patient- patient was seen in office today. Results reviewed.

## 2013-08-28 NOTE — Progress Notes (Signed)
Patient seen in office today for a MFM appointment.   Patient is to follow up in one month.  Send copy to [facility] of NIPS test results when available.  Would not recommend Tdap for Ms. Oriley due to complications with it in the past.  May use Vitamin B2 and Magnesium Oxide for headaches as needed Patient has a history of recurrent UTI, needs urine culture every trimester. Only treat with a positive culture not off a urine dip.  Patient has a history of seizures. Patient has not had one in a while and is no longer on medication. Patient is to avoid her triggers and to not get over tired.   Patient is considering a VBAC and will discuss with Dr. Delsa Sale. Patient is currently smoking 1/4 pack of cigarettes a day and working to cut back. Patient has a history of depression. Needs depression screen every trimester and post partum.  Patient has stopped Metformin and will need to schedule an early glucose test. Patient has stopped her Metoprolol and HTCZ when she became pregnant. Patient is scheduled to see her cardiologist the beginning of July. If her cardiologist feels it is necessary to restart medications it is okay.  Patient has a liver ultrasound, please send copy to [facility] when it becomes available. If the hemangiomas grow and become 5 cm or larger then will need to discuss delivery.   Today needs Baseline PIH labs and a UPC.   This encounter was created in error - please disregard. This encounter was created in error - please disregard.  Patient was assessed and managed by nursing staff during this encounter. I have reviewed the chart and agree with the documentation and plan. I have also made any necessary editorial changes.  Baltazar Najjar, MD 12/12/2019 8:21 AM

## 2013-08-29 ENCOUNTER — Ambulatory Visit (HOSPITAL_COMMUNITY): Payer: BC Managed Care – PPO

## 2013-08-29 LAB — CREATININE, SERUM: Creat: 0.65 mg/dL (ref 0.50–1.10)

## 2013-08-29 LAB — PROTEIN / CREATININE RATIO, URINE
Creatinine, Urine: 102.9 mg/dL
Protein Creatinine Ratio: 0.03 (ref <0.15)
Total Protein, Urine: 3 mg/dL

## 2013-08-29 LAB — ALT: ALT: 11 U/L (ref 0–35)

## 2013-08-29 LAB — PATHOLOGIST SMEAR REVIEW

## 2013-08-29 LAB — AST: AST: 12 U/L (ref 0–37)

## 2013-08-29 LAB — LACTATE DEHYDROGENASE: LDH: 126 U/L (ref 94–250)

## 2013-08-30 ENCOUNTER — Ambulatory Visit (HOSPITAL_COMMUNITY): Admission: RE | Admit: 2013-08-30 | Payer: BC Managed Care – PPO | Source: Ambulatory Visit

## 2013-09-02 ENCOUNTER — Telehealth (HOSPITAL_COMMUNITY): Payer: Self-pay | Admitting: MS"

## 2013-09-02 NOTE — Telephone Encounter (Signed)
Chelsea Haley to discuss her cell free fetal DNA test results.  [name] had Panorama testing through [facility].  Testing was offered because of previous ultrasound findings of increased nuchal translucency and absent nasal bone.   The patient was identified by name and DOB. Results were discussed over speaker phone with her husband also present. We reviewed that these are high risk for Down syndrome (>99% risk). We reviewed that this is not diagnostic, and the false positive rate is <1%. However, given the previous ultrasound findings, we discussed that the underlying diagnosis of Down syndrome is highly suspected. We reviewed the options of amniocentesis or postnatal chromosome analysis. [name] stated that she was still not likely interested in amniocentesis given the complication rate. She reiterated what she previously stated that she would plan to continue a pregnancy with Down syndrome. [name] asked where to go from here. We reviewed that detailed ultrasound and fetal echocardiogram are scheduled for 10/04/13 and to keep these appointments. I offered follow-up genetic counseling to review these results in more detail and to discuss available national and local support resources for Down syndrome. The couple expressed interest in this. Given that she has a previously scheduled appointment with her OB office on 09/09/13 for 2 hour GTT test, she would like to plan for follow-up genetic counseling that date. Genetic counseling is planned for Monday, 09/09/13 at 11:00 am (following when patient is through with her 2 hour GTT at primary OB office).   Additionally, the screen showed a less than 1 in 10,000 risk for trisomies 18, 13, and monosomy X (Turner syndrome).  In addition, the risk for triploidy/vanishing twin and sex chromosome trisomies (47,XXX and 47,XXY) was also low risk.  Analysis for 22q11 deletion syndrome was also low risk (1 in 13,300).  This testing identifies > 99% of  pregnancies with trisomy 59, trisomy 63, sex chromosome trisomies (47,XXX and 47,XXY), and triploidy. The detection rate for trisomy 18 is 96%.  The detection rate for monosomy X is ~92%.  The false positive rate is <0.1% for all conditions. Testing was also consistent with female fetal sex.  The patient did wish to know fetal sex, and was happy to learn this information.  She understands that this testing does not identify all genetic conditions.  All questions were answered to her satisfaction, she was encouraged to call with additional questions or concerns.  Chipper Oman, MS Certified Genetic Counselor 09/02/2013 11:51 AM

## 2013-09-03 ENCOUNTER — Encounter (HOSPITAL_COMMUNITY): Payer: Self-pay

## 2013-09-04 ENCOUNTER — Telehealth: Payer: Self-pay | Admitting: *Deleted

## 2013-09-04 NOTE — Telephone Encounter (Signed)
Patient states she is having a sharp pain on her left side; intermediate. It sometimes takes her breath away. Not happening but ever so often. Patient has not tried anything to help with the discomfort. Reviewed comfort measure with patient. Patient to try comfort measures and to call back tomorrow if not relief.

## 2013-09-06 ENCOUNTER — Other Ambulatory Visit: Payer: Self-pay

## 2013-09-06 ENCOUNTER — Ambulatory Visit (HOSPITAL_COMMUNITY)
Admission: RE | Admit: 2013-09-06 | Discharge: 2013-09-06 | Disposition: A | Discharge status: Home / Self Care | Payer: BC Managed Care – PPO | Source: Ambulatory Visit | Attending: Internal Medicine | Admitting: Internal Medicine

## 2013-09-06 DIAGNOSIS — O99891 Other specified diseases and conditions complicating pregnancy: Secondary | ICD-10-CM | POA: Insufficient documentation

## 2013-09-06 DIAGNOSIS — D1803 Hemangioma of intra-abdominal structures: Secondary | ICD-10-CM

## 2013-09-06 DIAGNOSIS — O9989 Other specified diseases and conditions complicating pregnancy, childbirth and the puerperium: Principal | ICD-10-CM

## 2013-09-09 ENCOUNTER — Other Ambulatory Visit: Payer: BC Managed Care – PPO

## 2013-09-09 ENCOUNTER — Ambulatory Visit (HOSPITAL_COMMUNITY)
Admission: RE | Admit: 2013-09-09 | Discharge: 2013-09-09 | Disposition: A | Discharge status: Home / Self Care | Payer: BC Managed Care – PPO | Source: Ambulatory Visit | Attending: Obstetrics & Gynecology | Admitting: Obstetrics & Gynecology

## 2013-09-09 DIAGNOSIS — Z3482 Encounter for supervision of other normal pregnancy, second trimester: Secondary | ICD-10-CM

## 2013-09-09 DIAGNOSIS — Z3480 Encounter for supervision of other normal pregnancy, unspecified trimester: Secondary | ICD-10-CM

## 2013-09-10 LAB — CBC
HCT: 35.6 % — ABNORMAL LOW (ref 36.0–46.0)
HEMOGLOBIN: 12.2 g/dL (ref 12.0–15.0)
MCH: 29.5 pg (ref 26.0–34.0)
MCHC: 34.3 g/dL (ref 30.0–36.0)
MCV: 86 fL (ref 78.0–100.0)
PLATELETS: 308 10*3/uL (ref 150–400)
RBC: 4.14 MIL/uL (ref 3.87–5.11)
RDW: 13.9 % (ref 11.5–15.5)
WBC: 10.9 10*3/uL — ABNORMAL HIGH (ref 4.0–10.5)

## 2013-09-10 LAB — GLUCOSE TOLERANCE, 2 HOURS W/ 1HR
GLUCOSE, 2 HOUR: 81 mg/dL (ref 70–139)
GLUCOSE, FASTING: 62 mg/dL — AB (ref 70–99)
GLUCOSE: 98 mg/dL (ref 70–170)

## 2013-09-10 LAB — RPR

## 2013-09-10 LAB — HIV ANTIBODY (ROUTINE TESTING W REFLEX): HIV: NONREACTIVE

## 2013-09-10 NOTE — Progress Notes (Signed)
Genetic Counseling  High-Risk Gestation Note  Appointment Date:  09/09/2013 Referred By: Chelsea Crocker, MD Date of Birth:  06/07/1975    Pregnancy History: [id number] Estimated Date of Delivery: 02/28/14 Estimated Gestational Age: 81w3dAttending: MRenella Cunas MD  I met with Chelsea Haley and her husband for follow-up genetic counseling because of an increased risk for fetal Down syndrome based on noninvasive prenatal screening/cell free DNA test results.   The results of the NIPS (Panorama) revealed a greater than 99% chance of fetal Down syndrome. We discussed that this testing identifies >99% of pregnancies with Down syndrome with a false positive rate of 0.1%. We reviewed that this testing is highly sensitive and specific, but is not considered diagnostic. They were counseled that given the ultrasound findings and the positive cell free DNA test result, the diagnosis of Down syndrome highly suspected and that the positive predictive value given the available information for the pregnancy is approximately 99%. We did review that the cell free fetal DNA test can not distinguish between aneuploidy confined to the placenta, trisomy 21, translocation 21, or mosaic trisomy 21. We discussed the availability of amniocentesis or postnatal peripheral blood chromosome analysis for confirmation of the diagnosis. This couple declined the option of amniocentesis. They wish to pursue postnatal confirmatory testing.   Considering the very high suspicion of Down syndrome, they were counseled in detail regarding this diagnosis. We discussed that there are different types of Down syndrome, and each type is determined by the arrangement of the #21 chromosomes. Approximately 95% of cases of Down syndrome are trisomy 21 and 2-4% are due to a translocation involving chromosome 21. We reviewed chromosomes, nondisjunction, and that chromosome division errors happen by chance and are not usually inherited. They  understand that confirmatory testing will provide an actual karyotype and will help to determine accurate risks of recurrence for a future pregnancy.   This couple was counseled that Down syndrome occurs once per every 800 births and is associated with specific features. However, there is a high degree of variability seen among children who have this condition, meaning that every child with Down syndrome will not be affected in exactly the same way, and some children will have more or less features than others. They were counseled that approximately half of individuals with Down syndrome have a cardiac anomaly and ~10-15% have an intestinal difference. Other anomalies of various organ systems have also been described in association with this diagnosis. Detailed ultrasound and fetal echocardiogram are scheduled for 10/04/13.   We discussed that in general most individuals with Down syndrome have mild to moderate intellectual disability and likely will require extra assistance with school work. Approximately 70-80% of children with Down syndrome have hypotonia which may lead to delays in sitting, walking, and talking. Hypotonia does tend to improve with age and early intervention services such as physical, occupational, and speech therapies can help with achievement of developmental milestones. We discussed that these therapies are typically provided to children (with qualifying diagnoses) from birth until age 37 We also discussed that Down syndrome is associated with characteristic facial features. Because of these facial features, many children with Down syndrome look similar to each other, but they were reminded that each child with Down syndrome is unique and will have many more features in common with his or her own family members.   We also reviewed that the AAP have established health supervision guidelines for individuals with Down syndrome. These guidelines provide medical management recommendations for  various  stages of life and can be a resource for families who have a child with Down syndrome as well as for health professionals. Chelsea Haley stated that they have not yet decided on a pediatrician and inquired about finding an appropriate pediatrician. We discussed interviewing pediatricians and asking about their experience caring for children with Down syndrome as well as discussing with local families. We provided Chelsea Haley with information regarding the Down syndrome support group through [employer] [facility] [place]. We also discussed that providing children with Down syndrome with a stimulating physical and social environment, as well as ensuring that the child receives adequate medical care and appropriate therapies, will help these children to reach their full potential. With the advances in medical technology, early intervention, and supportive therapies, many individuals with Down syndrome are able to live with an increasing degree of independence. Today, many adults with Down syndrome care for themselves, have jobs, and often times live in group homes or apartments where assistance is available if needed.   We discussed local and national support organizations. Additionally, we discussed that postnatal health management can be coordinated by a medical geneticist as well as with a multidisciplinary team of physicians. This couple was encouraged to contact us if they have any additional questions/concerns or if they need additional supportive or informational recommendations.   The majority of time (>50%) was spent on counseling with this couple. The approximate face-to-face time with the genetic counselor was 25 minutes.   Chipper Oman, MS Certified Genetic Counselor 09/10/2013

## 2013-09-11 ENCOUNTER — Ambulatory Visit (INDEPENDENT_AMBULATORY_CARE_PROVIDER_SITE_OTHER): Payer: BC Managed Care – PPO | Admitting: Cardiology

## 2013-09-11 ENCOUNTER — Encounter: Payer: Self-pay | Admitting: Obstetrics & Gynecology

## 2013-09-11 ENCOUNTER — Encounter: Payer: Self-pay | Admitting: Cardiology

## 2013-09-11 ENCOUNTER — Ambulatory Visit (INDEPENDENT_AMBULATORY_CARE_PROVIDER_SITE_OTHER): Payer: BC Managed Care – PPO | Admitting: Obstetrics & Gynecology

## 2013-09-11 VITALS — BP 130/80 | HR 80 | Temp 99.0°F | Wt 196.0 lb

## 2013-09-11 VITALS — BP 110/70 | HR 90 | Ht 61.0 in | Wt 195.0 lb

## 2013-09-11 DIAGNOSIS — O0990 Supervision of high risk pregnancy, unspecified, unspecified trimester: Secondary | ICD-10-CM

## 2013-09-11 DIAGNOSIS — I471 Supraventricular tachycardia: Secondary | ICD-10-CM

## 2013-09-11 DIAGNOSIS — R002 Palpitations: Secondary | ICD-10-CM

## 2013-09-11 NOTE — Progress Notes (Signed)
  Subjective:    Chelsea Haley is a 38 y.o. female being seen today for her obstetrical visit. She is at [redacted]w[redacted]d gestation. Patient reports: no complaints.  Problem List Items Addressed This Visit   None     Patient Active Problem List   Diagnosis Date Noted  . 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 Previous cesarean delivery affecting pregnancy 08/18/2013    Priority: High  . Unspecified high-risk pregnancy 08/14/2013    Priority: High  . Tobacco abuse 09/20/2011    Priority: High  . Depression with anxiety 09/20/2011    Priority: High  . Palpitations 09/11/2013  . Polycystic ovarian syndrome 01/14/2013  . 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 Pedal edema 11/14/2012  . Allergic rhinitis 11/14/2012  . Vaginitis and vulvovaginitis 09/12/2012  . Skin infection 09/03/2012  . Pre-conception counseling 07/23/2012  . Hearing loss 05/16/2012  . Epigastric pain 02/28/2012  . Liver hemangioma 02/28/2012  . Routine general medical examination at a health care facility 10/25/2011  . Obesity 09/20/2011  . PTSD (post-traumatic stress disorder) 09/20/2011  . Seizure disorder 09/20/2011  . Hyperlipidemia 09/20/2011  . Migraines 09/20/2011  . Frequent UTI 09/20/2011    Objective:     BP 130/80  Pulse 80  Temp(Src) 99 F (37.2 C)  Wt 88.905 kg (196 lb) Uterine Size: Below umbilicus     Assessment:    Pregnancy @ [redacted]w[redacted]d  weeks Doing well    Plan:    Problem list reviewed and updated. Labs reviewed.  Follow up in 4 weeks.

## 2013-09-11 NOTE — Patient Instructions (Signed)
Your physician wants you to follow-up in: ONE YEAR WITH DR CRENSHAW You will receive a reminder letter in the mail two months in advance. If you don't receive a letter, please call our office to schedule the follow-up appointment.  

## 2013-09-11 NOTE — Patient Instructions (Signed)

## 2013-09-11 NOTE — Assessment & Plan Note (Signed)
Patient has discontinued Toprol because she is [redacted] weeks pregnant. Her symptoms are stable. We will continue off of Toprol for now. If her symptoms worsen this can be resumed.

## 2013-09-11 NOTE — Progress Notes (Signed)
      HPI: FU palpitations and chest pain. Patient has a history of inappropriate sinus tachycardia which is reasonably well controlled with Toprol. Echocardiogram in July 2013 showed normal LV function and no significant valvular disease. Exercise treadmill in August of 2013 showed no chest pain and no ST depression but she did not achieve target heart rate. Patient not seen since July of 2013. Patient is now [redacted] weeks pregnant. Her beta blocker was discontinued. She notes some dyspnea with more vigorous activities but not routine activities. Mild edema. No chest pain. She has noticed mild increase in palpitations but not significant. No syncope.   Current Outpatient Prescriptions  Medication Sig Dispense Refill  . famotidine (PEPCID) 10 MG tablet Take 10 mg by mouth daily.       . prenatal vitamin w/FE, FA (NATACHEW) 29-1 MG CHEW chewable tablet Chew 1 tablet by mouth daily at 12 noon.  90 tablet  3   No current facility-administered medications for this visit.     Past Medical History  Diagnosis Date  . History of chicken pox   . Depression   . Migraines   . History of kidney stones   . History of seizure disorder     as a child. No medication since age 78.  Marland Kitchen Hyperlipidemia   . Hypertension   . Tachycardia   . History of frequent urinary tract infections   . GERD (gastroesophageal reflux disease)   . Binge eating   . Endometriosis   . Allergy     Past Surgical History  Procedure Laterality Date  . Cholecystectomy  2006  . Cesarean section  2002  . Pilonidal cyst excision  2003  . Laporoscopy    . Wisdom tooth extraction Bilateral     History   Social History  . Marital Status: Married    Spouse Name: N/A    Number of Children: 1  . Years of Education: N/A   Occupational History  .      Insructor [employer]   Social History Main Topics  . Smoking status: Current Some Day Smoker -- 0.03 packs/day for 20 years    Types: Cigarettes    Last Attempt to Quit:  10/19/2011  . Smokeless tobacco: Never Used     Comment: 4 cigarettes daily  . Alcohol Use: No  . Drug Use: No  . Sexual Activity: Yes    Partners: Male    Birth Control/ Protection: None   Other Topics Concern  . 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 Remaining systems are negative.  Physical Exam: Well-developed well-nourished in no acute distress.  Skin is warm and dry.  HEENT is normal.  Neck is supple.  Chest is clear to auscultation with normal expansion.  Cardiovascular exam is regular rate and rhythm.  Abdominal exam nontender or distended. [redacted] weeks pregnant Extremities show trace edema. neuro grossly intact  ECG Sinus rhythm at a rate of 90. No ST changes.

## 2013-09-16 LAB — POCT URINALYSIS DIPSTICK
Bilirubin, UA: NEGATIVE
GLUCOSE UA: NEGATIVE
Ketones, UA: NEGATIVE
Leukocytes, UA: NEGATIVE
Nitrite, UA: NEGATIVE
PH UA: 7
PROTEIN UA: NEGATIVE
RBC UA: NEGATIVE
Spec Grav, UA: 1.01
UROBILINOGEN UA: NEGATIVE

## 2013-09-16 NOTE — Addendum Note (Signed)
Addended by: Ladona Ridgel on: 09/16/2013 10:36 AM   Modules accepted: Orders

## 2013-09-23 ENCOUNTER — Encounter: Payer: Self-pay | Admitting: Obstetrics & Gynecology

## 2013-09-25 ENCOUNTER — Institutional Professional Consult (permissible substitution): Payer: BC Managed Care – PPO

## 2013-10-04 ENCOUNTER — Ambulatory Visit (HOSPITAL_COMMUNITY)
Admission: RE | Admit: 2013-10-04 | Discharge: 2013-10-04 | Disposition: A | Discharge status: Home / Self Care | Payer: BC Managed Care – PPO | Source: Ambulatory Visit | Attending: Obstetrics & Gynecology | Admitting: Obstetrics & Gynecology

## 2013-10-04 ENCOUNTER — Encounter (HOSPITAL_COMMUNITY): Payer: Self-pay

## 2013-10-04 ENCOUNTER — Other Ambulatory Visit (HOSPITAL_COMMUNITY): Payer: Self-pay | Admitting: Maternal and Fetal Medicine

## 2013-10-04 DIAGNOSIS — IMO0002 Reserved for concepts with insufficient information to code with codable children: Secondary | ICD-10-CM

## 2013-10-04 DIAGNOSIS — Z98891 History of uterine scar from previous surgery: Secondary | ICD-10-CM

## 2013-10-04 DIAGNOSIS — O351XX0 Maternal care for (suspected) chromosomal abnormality in fetus, not applicable or unspecified: Secondary | ICD-10-CM

## 2013-10-04 DIAGNOSIS — Z3689 Encounter for other specified antenatal screening: Secondary | ICD-10-CM | POA: Insufficient documentation

## 2013-10-04 DIAGNOSIS — O169 Unspecified maternal hypertension, unspecified trimester: Secondary | ICD-10-CM

## 2013-10-09 ENCOUNTER — Encounter: Payer: Self-pay | Admitting: Obstetrics & Gynecology

## 2013-10-09 ENCOUNTER — Ambulatory Visit (INDEPENDENT_AMBULATORY_CARE_PROVIDER_SITE_OTHER): Payer: BC Managed Care – PPO | Admitting: Obstetrics & Gynecology

## 2013-10-09 VITALS — BP 115/78 | HR 82 | Temp 98.1°F | Wt 196.0 lb

## 2013-10-09 DIAGNOSIS — Z3482 Encounter for supervision of other normal pregnancy, second trimester: Secondary | ICD-10-CM

## 2013-10-09 DIAGNOSIS — Z3480 Encounter for supervision of other normal pregnancy, unspecified trimester: Secondary | ICD-10-CM

## 2013-10-09 LAB — POCT URINALYSIS DIPSTICK
Bilirubin, UA: NEGATIVE
Glucose, UA: NEGATIVE
Ketones, UA: NEGATIVE
Leukocytes, UA: NEGATIVE
Nitrite, UA: NEGATIVE
PROTEIN UA: NEGATIVE
RBC UA: NEGATIVE
SPEC GRAV UA: 1.01
Urobilinogen, UA: NEGATIVE
pH, UA: 7

## 2013-10-15 NOTE — Patient Instructions (Signed)
Second Trimester of Pregnancy The second trimester is from week 13 through week 28, months 4 through 6. The second trimester is often a time when you feel your best. Your body has also adjusted to being pregnant, and you begin to feel better physically. Usually, morning sickness has lessened or quit completely, you may have more energy, and you may have an increase in appetite. The second trimester is also a time when the fetus is growing rapidly. At the end of the sixth month, the fetus is about 9 inches long and weighs about 1 pounds. You will likely begin to feel the baby move (quickening) between 18 and 20 weeks of the pregnancy. BODY CHANGES Your body goes through many changes during pregnancy. The changes vary from woman to woman.   Your weight will continue to increase. You will notice your lower abdomen bulging out.  You may begin to get stretch marks on your hips, abdomen, and breasts.  You may develop headaches that can be relieved by medicines approved by your health care provider.  You may urinate more often because the fetus is pressing on your bladder.  You may develop or continue to have heartburn as a result of your pregnancy.  You may develop constipation because certain hormones are causing the muscles that push waste through your intestines to slow down.  You may develop hemorrhoids or swollen, bulging veins (varicose veins).  You may have back pain because of the weight gain and pregnancy hormones relaxing your joints between the bones in your pelvis and as a result of a shift in weight and the muscles that support your balance.  Your breasts will continue to grow and be tender.  Your gums may bleed and may be sensitive to brushing and flossing.  Dark spots or blotches (chloasma, mask of pregnancy) may develop on your face. This will likely fade after the baby is born.  A dark line from your belly button to the pubic area (linea nigra) may appear. This will likely fade  after the baby is born.  You may have changes in your hair. These can include thickening of your hair, rapid growth, and changes in texture. Some women also have hair loss during or after pregnancy, or hair that feels dry or thin. Your hair will most likely return to normal after your baby is born. WHAT TO EXPECT AT YOUR PRENATAL VISITS During a routine prenatal visit:  You will be weighed to make sure you and the fetus are growing normally.  Your blood pressure will be taken.  Your abdomen will be measured to track your baby's growth.  The fetal heartbeat will be listened to.  Any test results from the previous visit will be discussed. Your health care provider may ask you:  How you are feeling.  If you are feeling the baby move.  If you have had any abnormal symptoms, such as leaking fluid, bleeding, severe headaches, or abdominal cramping.  If you have any questions. Other tests that may be performed during your second trimester include:  Blood tests that check for:  Low iron levels (anemia).  Gestational diabetes (between 24 and 28 weeks).  Rh antibodies.  Urine tests to check for infections, diabetes, or protein in the urine.  An ultrasound to confirm the proper growth and development of the baby.  An amniocentesis to check for possible genetic problems.  Fetal screens for spina bifida and Down syndrome. HOME CARE INSTRUCTIONS   Avoid all smoking, herbs, alcohol, and unprescribed   drugs. These chemicals affect the formation and growth of the baby.  Follow your health care provider's instructions regarding medicine use. There are medicines that are either safe or unsafe to take during pregnancy.  Exercise only as directed by your health care provider. Experiencing uterine cramps is a good sign to stop exercising.  Continue to eat regular, healthy meals.  Wear a good support bra for breast tenderness.  Do not use hot tubs, steam rooms, or saunas.  Wear your  seat belt at all times when driving.  Avoid raw meat, uncooked cheese, cat litter boxes, and soil used by cats. These carry germs that can cause birth defects in the baby.  Take your prenatal vitamins.  Try taking a stool softener (if your health care provider approves) if you develop constipation. Eat more high-fiber foods, such as fresh vegetables or fruit and whole grains. Drink plenty of fluids to keep your urine clear or pale yellow.  Take warm sitz baths to soothe any pain or discomfort caused by hemorrhoids. Use hemorrhoid cream if your health care provider approves.  If you develop varicose veins, wear support hose. Elevate your feet for 15 minutes, 3-4 times a day. Limit salt in your diet.  Avoid heavy lifting, wear low heel shoes, and practice good posture.  Rest with your legs elevated if you have leg cramps or low back pain.  Visit your dentist if you have not gone yet during your pregnancy. Use a soft toothbrush to brush your teeth and be gentle when you floss.  A sexual relationship may be continued unless your health care provider directs you otherwise.  Continue to go to all your prenatal visits as directed by your health care provider. SEEK MEDICAL CARE IF:   You have dizziness.  You have mild pelvic cramps, pelvic pressure, or nagging pain in the abdominal area.  You have persistent nausea, vomiting, or diarrhea.  You have a bad smelling vaginal discharge.  You have pain with urination. SEEK IMMEDIATE MEDICAL CARE IF:   You have a fever.  You are leaking fluid from your vagina.  You have spotting or bleeding from your vagina.  You have severe abdominal cramping or pain.  You have rapid weight gain or loss.  You have shortness of breath with chest pain.  You notice sudden or extreme swelling of your face, hands, ankles, feet, or legs.  You have not felt your baby move in over an hour.  You have severe headaches that do not go away with  medicine.  You have vision changes. Document Released: 02/22/2001 Document Revised: 03/05/2013 Document Reviewed: 05/01/2012 [employer] Patient Information 2015 [employer]. This information is not intended to replace advice given to you by your health care provider. Make sure you discuss any questions you have with your health care provider.  

## 2013-10-15 NOTE — Progress Notes (Signed)
Subjective:    Chelsea Haley is a 38 y.o. female being seen today for her obstetrical visit. She is at [redacted]w[redacted]d gestation. Patient reports: no complaints . Fetal movement: normal.  Problem List Items Addressed This Visit   None    Visit Diagnoses   Encounter for supervision of other normal pregnancy in second trimester    -  Primary    Relevant Orders       POCT urinalysis dipstick (Completed)      Patient Active Problem List   Diagnosis Date Noted  . 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 Previous cesarean delivery affecting pregnancy 08/18/2013    Priority: High  . Unspecified high-risk pregnancy 08/14/2013    Priority: High  . Benign essential HTN 07/17/2012    Priority: High  . Liver hemangioma 02/28/2012    Priority: High  . Tobacco abuse 09/20/2011    Priority: High  . Depression with anxiety 09/20/2011    Priority: High  . Frequent UTI 09/20/2011    Priority: High  . Palpitations 09/11/2013  . Polycystic ovarian syndrome 01/14/2013  . 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 Pedal edema 11/14/2012  . Allergic rhinitis 11/14/2012  . Vaginitis and vulvovaginitis 09/12/2012  . Skin infection 09/03/2012  . Pre-conception counseling 07/23/2012  . Family planning 07/17/2012  . H/O female genital system disorder 07/17/2012  . Hearing loss 05/16/2012  . Epigastric pain 02/28/2012  . Routine general medical examination at a health care facility 10/25/2011  . Obesity 09/20/2011  . PTSD (post-traumatic stress disorder) 09/20/2011  . Seizure disorder 09/20/2011  . Hyperlipidemia 09/20/2011  . Migraines 09/20/2011   Objective:    BP 115/78  Pulse 82  Temp(Src) 98.1 F (36.7 C)  Wt 88.905 kg (196 lb) FHT: 160 BPM  Uterine Size: size equals dates     Assessment:    Pregnancy @ [redacted]w[redacted]d    Plan:   Smoking cessation reviewed Labs, problem list  reviewed and updated Follow up in 4 weeks.

## 2013-10-30 ENCOUNTER — Encounter: Payer: Self-pay | Admitting: Obstetrics & Gynecology

## 2013-10-31 ENCOUNTER — Encounter (HOSPITAL_COMMUNITY): Payer: Self-pay

## 2013-10-31 ENCOUNTER — Ambulatory Visit (HOSPITAL_COMMUNITY)
Admission: RE | Admit: 2013-10-31 | Discharge: 2013-10-31 | Disposition: A | Discharge status: Home / Self Care | Payer: Medicaid Other | Source: Ambulatory Visit | Attending: Maternal and Fetal Medicine | Admitting: Maternal and Fetal Medicine

## 2013-10-31 VITALS — BP 120/72 | HR 95 | Wt 198.5 lb

## 2013-10-31 DIAGNOSIS — O99210 Obesity complicating pregnancy, unspecified trimester: Secondary | ICD-10-CM

## 2013-10-31 DIAGNOSIS — O34219 Maternal care for unspecified type scar from previous cesarean delivery: Secondary | ICD-10-CM | POA: Diagnosis not present

## 2013-10-31 DIAGNOSIS — O169 Unspecified maternal hypertension, unspecified trimester: Secondary | ICD-10-CM

## 2013-10-31 DIAGNOSIS — O351XX0 Maternal care for (suspected) chromosomal abnormality in fetus, not applicable or unspecified: Secondary | ICD-10-CM | POA: Diagnosis present

## 2013-10-31 DIAGNOSIS — O10019 Pre-existing essential hypertension complicating pregnancy, unspecified trimester: Secondary | ICD-10-CM | POA: Diagnosis not present

## 2013-10-31 DIAGNOSIS — Z98891 History of uterine scar from previous surgery: Secondary | ICD-10-CM

## 2013-10-31 DIAGNOSIS — Z3689 Encounter for other specified antenatal screening: Secondary | ICD-10-CM | POA: Diagnosis not present

## 2013-10-31 DIAGNOSIS — E669 Obesity, unspecified: Secondary | ICD-10-CM

## 2013-10-31 DIAGNOSIS — IMO0002 Reserved for concepts with insufficient information to code with codable children: Secondary | ICD-10-CM

## 2013-10-31 DIAGNOSIS — O09522 Supervision of elderly multigravida, second trimester: Secondary | ICD-10-CM

## 2013-10-31 DIAGNOSIS — O3510X0 Maternal care for (suspected) chromosomal abnormality in fetus, unspecified, not applicable or unspecified: Secondary | ICD-10-CM | POA: Insufficient documentation

## 2013-11-06 ENCOUNTER — Institutional Professional Consult (permissible substitution): Payer: BC Managed Care – PPO

## 2013-11-06 ENCOUNTER — Encounter: Payer: Self-pay | Admitting: Obstetrics & Gynecology

## 2013-11-06 ENCOUNTER — Ambulatory Visit (INDEPENDENT_AMBULATORY_CARE_PROVIDER_SITE_OTHER): Payer: BC Managed Care – PPO | Admitting: Obstetrics

## 2013-11-06 ENCOUNTER — Ambulatory Visit (HOSPITAL_COMMUNITY)
Admission: RE | Admit: 2013-11-06 | Discharge: 2013-11-06 | Disposition: A | Discharge status: Home / Self Care | Payer: Medicaid Other | Source: Ambulatory Visit | Attending: Internal Medicine | Admitting: Internal Medicine

## 2013-11-06 VITALS — BP 120/80 | HR 100 | Temp 97.1°F | Wt 199.0 lb

## 2013-11-06 DIAGNOSIS — D1803 Hemangioma of intra-abdominal structures: Secondary | ICD-10-CM | POA: Insufficient documentation

## 2013-11-06 DIAGNOSIS — Z3480 Encounter for supervision of other normal pregnancy, unspecified trimester: Secondary | ICD-10-CM

## 2013-11-06 DIAGNOSIS — Z3482 Encounter for supervision of other normal pregnancy, second trimester: Secondary | ICD-10-CM

## 2013-11-06 LAB — POCT URINALYSIS DIPSTICK
BILIRUBIN UA: NEGATIVE
Blood, UA: NEGATIVE
Glucose, UA: NEGATIVE
KETONES UA: NEGATIVE
Leukocytes, UA: NEGATIVE
Nitrite, UA: NEGATIVE
PH UA: 6
PROTEIN UA: NEGATIVE
Spec Grav, UA: 1.015
Urobilinogen, UA: NEGATIVE

## 2013-11-08 NOTE — Progress Notes (Signed)
Subjective:    Chelsea Haley is a 38 y.o. female being seen today for her obstetrical visit. She is at [redacted]w[redacted]d gestation. Patient reports: no complaints . Fetal movement: normal.  Problem List Items Addressed This Visit   None    Visit Diagnoses   Encounter for supervision of other normal pregnancy in second trimester    -  Primary    Relevant Orders       POCT urinalysis dipstick (Completed)      Patient Active Problem List   Diagnosis Date Noted  . Palpitations 09/11/2013  . 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 Nodal paroxysmal tachycardia 08/18/2013  . Previous cesarean delivery affecting pregnancy 08/18/2013  . Unspecified high-risk pregnancy 08/14/2013  . Polycystic ovarian syndrome 01/14/2013  . 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 Pedal edema 11/14/2012  . Allergic rhinitis 11/14/2012  . Vaginitis and vulvovaginitis 09/12/2012  . Skin infection 09/03/2012  . Pre-conception counseling 07/23/2012  . Family planning 07/17/2012  . Benign essential HTN 07/17/2012  . H/O female genital system disorder 07/17/2012  . Hearing loss 05/16/2012  . Epigastric pain 02/28/2012  . Liver hemangioma 02/28/2012  . Routine general medical examination at a health care facility 10/25/2011  . Tobacco abuse 09/20/2011  . Obesity 09/20/2011  . Depression with anxiety 09/20/2011  . PTSD (post-traumatic stress disorder) 09/20/2011  . Seizure disorder 09/20/2011  . Hyperlipidemia 09/20/2011  . Migraines 09/20/2011  . Frequent UTI 09/20/2011   Objective:    BP 120/80  Pulse 100  Temp(Src) 97.1 F (36.2 C)  Wt 199 lb (90.266 kg) FHT: 160 BPM  Uterine Size: size equals dates     Assessment:    Pregnancy @ [redacted]w[redacted]d    Plan:    OBGCT: ordered.  Labs, problem list reviewed and updated 2 hr GTT planned Follow up in 2 weeks.

## 2013-11-14 ENCOUNTER — Telehealth: Payer: Self-pay | Admitting: *Deleted

## 2013-11-14 NOTE — Telephone Encounter (Signed)
Patient states she is not feeling well today- she has been having some nausea today. 12:45 Call to patient - she states she is not feeling well, she awoke with heartburn really bad early in the am and had to take TUMS and now feels sick. Patient denies fever, diarhea and vomiting. Patient does state that there is a stomach bug going around her son's school. Patient to monitor symptoms- eat bland diet and increase fluids. Patient to call doctor on call if she has an increase in her discomfort.

## 2013-11-17 ENCOUNTER — Encounter: Payer: Self-pay | Admitting: Obstetrics & Gynecology

## 2013-11-20 ENCOUNTER — Encounter: Payer: BC Managed Care – PPO | Admitting: Obstetrics & Gynecology

## 2013-11-20 ENCOUNTER — Other Ambulatory Visit: Payer: BC Managed Care – PPO

## 2013-11-21 ENCOUNTER — Ambulatory Visit (INDEPENDENT_AMBULATORY_CARE_PROVIDER_SITE_OTHER): Payer: Medicaid Other | Admitting: Obstetrics & Gynecology

## 2013-11-21 ENCOUNTER — Other Ambulatory Visit: Payer: BC Managed Care – PPO

## 2013-11-21 ENCOUNTER — Encounter: Payer: Self-pay | Admitting: Obstetrics & Gynecology

## 2013-11-21 VITALS — BP 110/79 | HR 73 | Temp 97.1°F | Wt 201.0 lb

## 2013-11-21 DIAGNOSIS — O3421 Maternal care for scar from previous cesarean delivery: Secondary | ICD-10-CM

## 2013-11-21 DIAGNOSIS — D1803 Hemangioma of intra-abdominal structures: Secondary | ICD-10-CM

## 2013-11-21 DIAGNOSIS — Z3480 Encounter for supervision of other normal pregnancy, unspecified trimester: Secondary | ICD-10-CM

## 2013-11-21 DIAGNOSIS — O34219 Maternal care for unspecified type scar from previous cesarean delivery: Secondary | ICD-10-CM

## 2013-11-21 DIAGNOSIS — Z3482 Encounter for supervision of other normal pregnancy, second trimester: Secondary | ICD-10-CM

## 2013-11-21 DIAGNOSIS — Z23 Encounter for immunization: Secondary | ICD-10-CM

## 2013-11-21 LAB — POCT URINALYSIS DIPSTICK
BILIRUBIN UA: NEGATIVE
GLUCOSE UA: NEGATIVE
Ketones, UA: NEGATIVE
Leukocytes, UA: NEGATIVE
Nitrite, UA: NEGATIVE
RBC UA: NEGATIVE
Spec Grav, UA: 1.015
Urobilinogen, UA: NEGATIVE
pH, UA: 6.5

## 2013-11-21 NOTE — Patient Instructions (Signed)
Vaginal Birth After Cesarean Delivery Vaginal birth after cesarean delivery (VBAC) is giving birth vaginally after previously delivering a baby by a cesarean. In the past, if a woman had a cesarean delivery, all births afterward would be done by cesarean delivery. This is no longer true. It can be safe for the mother to try a vaginal delivery after having a cesarean delivery.  It is important to discuss VBAC with your health care provider early in the pregnancy so you can understand the risks, benefits, and options. It will give you time to decide what is best in your particular case. The final decision about whether to have a VBAC or repeat cesarean delivery should be between you and your health care provider. Any changes in your health or your baby's health during your pregnancy may make it necessary to change your initial decision about VBAC.  WOMEN WHO PLAN TO HAVE A VBAC SHOULD CHECK WITH THEIR HEALTH CARE PROVIDER TO BE SURE THAT:  The previous cesarean delivery was done with a low transverse uterine cut (incision) (not a vertical classical incision).   The birth canal is big enough for the baby.   There were no other operations on the uterus.   An electronic fetal monitor (EFM) will be on at all times during labor.   An operating room will be available and ready in case an emergency cesarean delivery is needed.   A health care provider and surgical nursing staff will be available at all times during labor to be ready to do an emergency delivery cesarean if necessary.   An anesthesiologist will be present in case an emergency cesarean delivery is needed.   The nursery is prepared and has adequate personnel and necessary equipment available to care for the baby in case of an emergency cesarean delivery. BENEFITS OF VBAC  Shorter stay in the hospital.   Avoidance of risks associated with cesarean delivery, such as:  Surgical complications, such as opening of the incision or  hernia in the incision.  Injury to other organs.  Fever. This can occur if an infection develops after surgery. It can also occur as a reaction to the medicine given to make you numb during the surgery.  Less blood loss and need for blood transfusions.  Lower risk of blood clots and infection.  Shorter recovery.   Decreased risk for having to remove the uterus (hysterectomy).   Decreased risk for the placenta to completely or partially cover the opening of the uterus (placenta previa) with a future pregnancy.   Decrease risk in future labor and delivery. RISKS OF A VBAC  Tearing (rupture) of the uterus. This is occurs in less than 1% of VBACs. The risk of this happening is higher if:  Steps are taken to begin the labor process (induce labor) or stimulate or strengthen contractions (augment labor).   Medicine is used to soften (ripen) the cervix.  Having to remove the uterus (hysterectomy) if it ruptures. VBAC SHOULD NOT BE DONE IF:  The previous cesarean delivery was done with a vertical (classical) or T-shaped incision or you do not know what kind of incision was made.   You had a ruptured uterus.   You have had certain types of surgery on your uterus, such as removal of uterine fibroids. Ask your health care provider about other types of surgeries that prevent you from having a VBAC.  You have certain medical or childbirth (obstetrical) problems.   There are problems with the baby.   You  have had two previous cesarean deliveries and no vaginal deliveries. OTHER FACTS TO KNOW ABOUT VBAC:  It is safe to have an epidural anesthetic with VBAC.   It is safe to turn the baby from a breech position (attempt an external cephalic version).   It is safe to try a VBAC with twins.   VBAC may not be successful if your baby weights 8.8 lb (4 kg) or more. However, weight predictions are not always accurate and should not be used alone to decide if VBAC is right for  you.  There is an increased failure rate if the time between the cesarean delivery and VBAC is less than 19 months.   Your health care provider may advise against a VBAC if you have preeclampsia (high blood pressure, protein in the urine, and swelling of face and extremities).   VBAC is often successful if you previously gave birth vaginally.   VBAC is often successful when the labor starts spontaneously before the due date.   Delivering a baby through a VBAC is similar to having a normal spontaneous vaginal delivery. Document Released: 08/21/2006 Document Revised: 07/15/2013 Document Reviewed: 09/27/2012 [facility] Patient Information 2015 [place], [place]. This information is not intended to replace advice given to you by your health care provider. Make sure you discuss any questions you have with your health care provider.

## 2013-11-21 NOTE — Progress Notes (Signed)
Subjective:    Chelsea Haley is a 38 y.o. female being seen today for her obstetrical visit. She is at [redacted]w[redacted]d  gestation. Patient reports: no complaints . Fetal movement: normal.  Problem List Items Addressed This Visit   None    Visit Diagnoses   Encounter for supervision of other normal pregnancy in second trimester    -  Primary    Relevant Orders       POCT urinalysis dipstick       Glucose Tolerance, 2 Hours w/1 Hour       CBC       HIV antibody       RPR      Patient Active Problem List   Diagnosis Date Noted  . 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 Previous cesarean delivery affecting pregnancy 08/18/2013    Priority: High  . Unspecified high-risk pregnancy 08/14/2013    Priority: High  . Benign essential HTN 07/17/2012    Priority: High  . Liver hemangioma 02/28/2012    Priority: High  . Depression with anxiety 09/20/2011    Priority: High  . Frequent UTI 09/20/2011    Priority: High  . Palpitations 09/11/2013  . Polycystic ovarian syndrome 01/14/2013  . 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 Pedal edema 11/14/2012  . Allergic rhinitis 11/14/2012  . Vaginitis and vulvovaginitis 09/12/2012  . Skin infection 09/03/2012  . Pre-conception counseling 07/23/2012  . Family planning 07/17/2012  . H/O female genital system disorder 07/17/2012  . Hearing loss 05/16/2012  . Epigastric pain 02/28/2012  . Routine general medical examination at a health care facility 10/25/2011  . Obesity 09/20/2011  . PTSD (post-traumatic stress disorder) 09/20/2011  . Seizure disorder 09/20/2011  . Hyperlipidemia 09/20/2011  . Migraines 09/20/2011   Objective:    BP 110/79  Pulse 73  Temp(Src) 97.1 F (36.2 C)  Wt 91.173 kg (201 lb) FHT: 160 BPM  Uterine Size: size equals dates     Assessment:    Pregnancy @ [redacted]w[redacted]d    Stopped smoking Plan:    OBGCT: ordered. Flu vaccine today Abd U/S this month Labs, problem list reviewed and updated Follow up in 2 weeks.

## 2013-11-22 LAB — GLUCOSE TOLERANCE, 2 HOURS W/ 1HR
GLUCOSE, 2 HOUR: 91 mg/dL (ref 70–139)
GLUCOSE: 92 mg/dL (ref 70–170)
Glucose, Fasting: 68 mg/dL — ABNORMAL LOW (ref 70–99)

## 2013-11-22 LAB — RPR

## 2013-11-22 LAB — CBC
HEMATOCRIT: 33.8 % — AB (ref 36.0–46.0)
HEMOGLOBIN: 11.1 g/dL — AB (ref 12.0–15.0)
MCH: 29.1 pg (ref 26.0–34.0)
MCHC: 32.8 g/dL (ref 30.0–36.0)
MCV: 88.7 fL (ref 78.0–100.0)
Platelets: 335 10*3/uL (ref 150–400)
RBC: 3.81 MIL/uL — ABNORMAL LOW (ref 3.87–5.11)
RDW: 13.6 % (ref 11.5–15.5)
WBC: 11.2 10*3/uL — AB (ref 4.0–10.5)

## 2013-11-22 LAB — HIV ANTIBODY (ROUTINE TESTING W REFLEX): HIV: NONREACTIVE

## 2013-11-25 ENCOUNTER — Other Ambulatory Visit: Payer: Self-pay | Admitting: Obstetrics & Gynecology

## 2013-11-25 DIAGNOSIS — O34219 Maternal care for unspecified type scar from previous cesarean delivery: Secondary | ICD-10-CM

## 2013-11-25 DIAGNOSIS — O99340 Other mental disorders complicating pregnancy, unspecified trimester: Secondary | ICD-10-CM

## 2013-11-25 DIAGNOSIS — O09522 Supervision of elderly multigravida, second trimester: Secondary | ICD-10-CM

## 2013-11-25 DIAGNOSIS — O289 Unspecified abnormal findings on antenatal screening of mother: Secondary | ICD-10-CM

## 2013-11-25 DIAGNOSIS — Z8751 Personal history of pre-term labor: Secondary | ICD-10-CM

## 2013-11-28 ENCOUNTER — Ambulatory Visit (HOSPITAL_COMMUNITY)
Admission: RE | Admit: 2013-11-28 | Discharge: 2013-11-28 | Disposition: A | Discharge status: Home / Self Care | Payer: Medicaid Other | Source: Ambulatory Visit | Attending: Obstetrics & Gynecology | Admitting: Obstetrics & Gynecology

## 2013-11-28 ENCOUNTER — Encounter (HOSPITAL_COMMUNITY): Payer: Self-pay

## 2013-11-28 ENCOUNTER — Ambulatory Visit (HOSPITAL_COMMUNITY): Payer: Medicaid Other

## 2013-11-28 DIAGNOSIS — O09522 Supervision of elderly multigravida, second trimester: Secondary | ICD-10-CM

## 2013-11-28 DIAGNOSIS — O99340 Other mental disorders complicating pregnancy, unspecified trimester: Secondary | ICD-10-CM | POA: Diagnosis not present

## 2013-11-28 DIAGNOSIS — O09529 Supervision of elderly multigravida, unspecified trimester: Secondary | ICD-10-CM | POA: Insufficient documentation

## 2013-11-28 DIAGNOSIS — Z8751 Personal history of pre-term labor: Secondary | ICD-10-CM

## 2013-11-28 DIAGNOSIS — O34219 Maternal care for unspecified type scar from previous cesarean delivery: Secondary | ICD-10-CM | POA: Insufficient documentation

## 2013-11-28 DIAGNOSIS — O289 Unspecified abnormal findings on antenatal screening of mother: Secondary | ICD-10-CM | POA: Diagnosis not present

## 2013-12-17 ENCOUNTER — Telehealth: Payer: Self-pay | Admitting: *Deleted

## 2013-12-17 NOTE — Telephone Encounter (Signed)
Patient states she is having an gas and bloating like feeling. Pretty uncomfortable. Patient advised to try some gas relief medication and to call back if her symptoms persist.

## 2013-12-19 ENCOUNTER — Ambulatory Visit (INDEPENDENT_AMBULATORY_CARE_PROVIDER_SITE_OTHER): Payer: Medicaid Other | Admitting: Obstetrics & Gynecology

## 2013-12-19 ENCOUNTER — Inpatient Hospital Stay (HOSPITAL_COMMUNITY): Payer: Medicaid Other

## 2013-12-19 ENCOUNTER — Inpatient Hospital Stay (HOSPITAL_COMMUNITY)
Admission: AD | Admit: 2013-12-19 | Discharge: 2013-12-19 | Disposition: A | Discharge status: Home / Self Care | Payer: Medicaid Other | Source: Ambulatory Visit | Attending: Obstetrics | Admitting: Obstetrics

## 2013-12-19 ENCOUNTER — Encounter: Payer: Self-pay | Admitting: Obstetrics & Gynecology

## 2013-12-19 VITALS — BP 108/75 | HR 83 | Temp 98.7°F | Wt 201.0 lb

## 2013-12-19 DIAGNOSIS — O99333 Smoking (tobacco) complicating pregnancy, third trimester: Secondary | ICD-10-CM | POA: Diagnosis not present

## 2013-12-19 DIAGNOSIS — Z3A29 29 weeks gestation of pregnancy: Secondary | ICD-10-CM | POA: Diagnosis not present

## 2013-12-19 DIAGNOSIS — O34219 Maternal care for unspecified type scar from previous cesarean delivery: Secondary | ICD-10-CM

## 2013-12-19 DIAGNOSIS — O4700 False labor before 37 completed weeks of gestation, unspecified trimester: Secondary | ICD-10-CM

## 2013-12-19 DIAGNOSIS — O3421 Maternal care for scar from previous cesarean delivery: Secondary | ICD-10-CM | POA: Diagnosis not present

## 2013-12-19 DIAGNOSIS — I471 Supraventricular tachycardia: Secondary | ICD-10-CM

## 2013-12-19 DIAGNOSIS — O4703 False labor before 37 completed weeks of gestation, third trimester: Secondary | ICD-10-CM

## 2013-12-19 DIAGNOSIS — O09523 Supervision of elderly multigravida, third trimester: Secondary | ICD-10-CM | POA: Diagnosis present

## 2013-12-19 DIAGNOSIS — O479 False labor, unspecified: Secondary | ICD-10-CM

## 2013-12-19 DIAGNOSIS — IMO0002 Reserved for concepts with insufficient information to code with codable children: Secondary | ICD-10-CM

## 2013-12-19 DIAGNOSIS — O351XX0 Maternal care for (suspected) chromosomal abnormality in fetus, not applicable or unspecified: Secondary | ICD-10-CM

## 2013-12-19 DIAGNOSIS — Z3483 Encounter for supervision of other normal pregnancy, third trimester: Secondary | ICD-10-CM

## 2013-12-19 DIAGNOSIS — F17210 Nicotine dependence, cigarettes, uncomplicated: Secondary | ICD-10-CM | POA: Insufficient documentation

## 2013-12-19 LAB — POCT URINALYSIS DIPSTICK
Blood, UA: NEGATIVE
Glucose, UA: NEGATIVE
Ketones, UA: 2
LEUKOCYTES UA: NEGATIVE
NITRITE UA: NEGATIVE
Protein, UA: NEGATIVE
Spec Grav, UA: 1.015
pH, UA: 5

## 2013-12-19 NOTE — Progress Notes (Signed)
Subjective:    Chelsea Haley is a 38 y.o. female being seen today for her obstetrical visit. She is at [redacted]w[redacted]d gestation. Patient reports occasional contractions. Fetal movement: normal.  Problem List Items Addressed This Visit   None    Visit Diagnoses   Encounter for supervision of other normal pregnancy in third trimester    -  Primary    Relevant Orders       POCT urinalysis dipstick (Completed)    Preterm contractions, third trimester        Relevant Orders       Fetal non-stress test      Patient Active Problem List   Diagnosis Date Noted  . 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 Previous cesarean delivery affecting pregnancy 08/18/2013    Priority: High  . Unspecified high-risk pregnancy 08/14/2013    Priority: High  . Benign essential HTN 07/17/2012    Priority: High  . Liver hemangioma 02/28/2012    Priority: High  . Depression with anxiety 09/20/2011    Priority: High  . Frequent UTI 09/20/2011    Priority: High  . Palpitations 09/11/2013  . Polycystic ovarian syndrome 01/14/2013  . 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 Pedal edema 11/14/2012  . Allergic rhinitis 11/14/2012  . Vaginitis and vulvovaginitis 09/12/2012  . Skin infection 09/03/2012  . Pre-conception counseling 07/23/2012  . Family planning 07/17/2012  . H/O female genital system disorder 07/17/2012  . Hearing loss 05/16/2012  . Epigastric pain 02/28/2012  . Routine general medical examination at a health care facility 10/25/2011  . Obesity 09/20/2011  . PTSD (post-traumatic stress disorder) 09/20/2011  . Seizure disorder 09/20/2011  . Hyperlipidemia 09/20/2011  . Migraines 09/20/2011   Objective:    BP 108/75  Pulse 83  Temp(Src) 98.7 F (37.1 C)  Wt 91.173 kg (201 lb) FHT:  150 BPM  Uterine Size: size equals dates  Presentation: unsure     Assessment:    Pregnancy @ [redacted]w[redacted]d weeks   Plan:     labs reviewed, problem list updated Consent signed. TDAP offered  Pediatrician: discussed. Infant feeding: plans to breastfeed. Maternity leave: discussed. Cigarette smoking: continues to smoke minimally. Orders Placed This Encounter  Procedures  . Fetal non-stress test    Standing Status: Standing     Number of Occurrences: 1     Standing Expiration Date:   . POCT urinalysis dipstick    Follow up in 2 Weeks.

## 2013-12-19 NOTE — MAU Note (Signed)
Patient states she was in the office today and had an ultrasound and cervical exam. Dr. Delsa Sale wants patient to have an ultrasound to check cervix. Patient denies bleeding or contractions and reports good fetal movement.

## 2013-12-20 LAB — WET PREP BY MOLECULAR PROBE
Candida species: NEGATIVE
GARDNERELLA VAGINALIS: POSITIVE — AB
Trichomonas vaginosis: NEGATIVE

## 2013-12-21 LAB — URINE CULTURE
Colony Count: NO GROWTH
Organism ID, Bacteria: NO GROWTH

## 2013-12-22 NOTE — Patient Instructions (Signed)
Third Trimester of Pregnancy The third trimester is from week 29 through week 42, months 7 through 9. The third trimester is a time when the fetus is growing rapidly. At the end of the ninth month, the fetus is about 20 inches in length and weighs 6-10 pounds.  BODY CHANGES Your body goes through many changes during pregnancy. The changes vary from woman to woman.   Your weight will continue to increase. You can expect to gain 25-35 pounds (11-16 kg) by the end of the pregnancy.  You may begin to get stretch marks on your hips, abdomen, and breasts.  You may urinate more often because the fetus is moving lower into your pelvis and pressing on your bladder.  You may develop or continue to have heartburn as a result of your pregnancy.  You may develop constipation because certain hormones are causing the muscles that push waste through your intestines to slow down.  You may develop hemorrhoids or swollen, bulging veins (varicose veins).  You may have pelvic pain because of the weight gain and pregnancy hormones relaxing your joints between the bones in your pelvis. Backaches may result from overexertion of the muscles supporting your posture.  You may have changes in your hair. These can include thickening of your hair, rapid growth, and changes in texture. Some women also have hair loss during or after pregnancy, or hair that feels dry or thin. Your hair will most likely return to normal after your baby is born.  Your breasts will continue to grow and be tender. A yellow discharge may leak from your breasts called colostrum.  Your belly button may stick out.  You may feel short of breath because of your expanding uterus.  You may notice the fetus "dropping," or moving lower in your abdomen.  You may have a bloody mucus discharge. This usually occurs a few days to a week before labor begins.  Your cervix becomes thin and soft (effaced) near your due date. WHAT TO EXPECT AT YOUR PRENATAL  EXAMS  You will have prenatal exams every 2 weeks until week 36. Then, you will have weekly prenatal exams. During a routine prenatal visit:  You will be weighed to make sure you and the fetus are growing normally.  Your blood pressure is taken.  Your abdomen will be measured to track your baby's growth.  The fetal heartbeat will be listened to.  Any test results from the previous visit will be discussed.  You may have a cervical check near your due date to see if you have effaced. At around 36 weeks, your caregiver will check your cervix. At the same time, your caregiver will also perform a test on the secretions of the vaginal tissue. This test is to determine if a type of bacteria, Group B streptococcus, is present. Your caregiver will explain this further. Your caregiver may ask you:  What your birth plan is.  How you are feeling.  If you are feeling the baby move.  If you have had any abnormal symptoms, such as leaking fluid, bleeding, severe headaches, or abdominal cramping.  If you have any questions. Other tests or screenings that may be performed during your third trimester include:  Blood tests that check for low iron levels (anemia).  Fetal testing to check the health, activity level, and growth of the fetus. Testing is done if you have certain medical conditions or if there are problems during the pregnancy. FALSE LABOR You may feel small, irregular contractions that   eventually go away. These are called Braxton Hicks contractions, or false labor. Contractions may last for hours, days, or even weeks before true labor sets in. If contractions come at regular intervals, intensify, or become painful, it is best to be seen by your caregiver.  SIGNS OF LABOR   Menstrual-like cramps.  Contractions that are 5 minutes apart or less.  Contractions that start on the top of the uterus and spread down to the lower abdomen and back.  A sense of increased pelvic pressure or back  pain.  A watery or bloody mucus discharge that comes from the vagina. If you have any of these signs before the 37th week of pregnancy, call your caregiver right away. You need to go to the hospital to get checked immediately. HOME CARE INSTRUCTIONS   Avoid all smoking, herbs, alcohol, and unprescribed drugs. These chemicals affect the formation and growth of the baby.  Follow your caregiver's instructions regarding medicine use. There are medicines that are either safe or unsafe to take during pregnancy.  Exercise only as directed by your caregiver. Experiencing uterine cramps is a good sign to stop exercising.  Continue to eat regular, healthy meals.  Wear a good support bra for breast tenderness.  Do not use hot tubs, steam rooms, or saunas.  Wear your seat belt at all times when driving.  Avoid raw meat, uncooked cheese, cat litter boxes, and soil used by cats. These carry germs that can cause birth defects in the baby.  Take your prenatal vitamins.  Try taking a stool softener (if your caregiver approves) if you develop constipation. Eat more high-fiber foods, such as fresh vegetables or fruit and whole grains. Drink plenty of fluids to keep your urine clear or pale yellow.  Take warm sitz baths to soothe any pain or discomfort caused by hemorrhoids. Use hemorrhoid cream if your caregiver approves.  If you develop varicose veins, wear support hose. Elevate your feet for 15 minutes, 3-4 times a day. Limit salt in your diet.  Avoid heavy lifting, wear low heal shoes, and practice good posture.  Rest a lot with your legs elevated if you have leg cramps or low back pain.  Visit your dentist if you have not gone during your pregnancy. Use a soft toothbrush to brush your teeth and be gentle when you floss.  A sexual relationship may be continued unless your caregiver directs you otherwise.  Do not travel far distances unless it is absolutely necessary and only with the approval  of your caregiver.  Take prenatal classes to understand, practice, and ask questions about the labor and delivery.  Make a trial run to the hospital.  Pack your hospital bag.  Prepare the baby's nursery.  Continue to go to all your prenatal visits as directed by your caregiver. SEEK MEDICAL CARE IF:  You are unsure if you are in labor or if your water has broken.  You have dizziness.  You have mild pelvic cramps, pelvic pressure, or nagging pain in your abdominal area.  You have persistent nausea, vomiting, or diarrhea.  You have a bad smelling vaginal discharge.  You have pain with urination. SEEK IMMEDIATE MEDICAL CARE IF:   You have a fever.  You are leaking fluid from your vagina.  You have spotting or bleeding from your vagina.  You have severe abdominal cramping or pain.  You have rapid weight loss or gain.  You have shortness of breath with chest pain.  You notice sudden or extreme swelling   of your face, hands, ankles, feet, or legs.  You have not felt your baby move in over an hour.  You have severe headaches that do not go away with medicine.  You have vision changes. Document Released: 02/22/2001 Document Revised: 03/05/2013 Document Reviewed: 05/01/2012 [employer] Patient Information 2015 [employer]. This information is not intended to replace advice given to you by your health care provider. Make sure you discuss any questions you have with your health care provider.  

## 2013-12-23 ENCOUNTER — Ambulatory Visit (HOSPITAL_COMMUNITY)
Admission: RE | Admit: 2013-12-23 | Discharge: 2013-12-23 | Disposition: A | Discharge status: Home / Self Care | Payer: Medicaid Other | Source: Ambulatory Visit | Attending: Obstetrics & Gynecology | Admitting: Obstetrics & Gynecology

## 2013-12-23 DIAGNOSIS — D1809 Hemangioma of other sites: Secondary | ICD-10-CM | POA: Insufficient documentation

## 2013-12-23 DIAGNOSIS — K769 Liver disease, unspecified: Secondary | ICD-10-CM | POA: Diagnosis not present

## 2013-12-23 DIAGNOSIS — D1803 Hemangioma of intra-abdominal structures: Secondary | ICD-10-CM

## 2013-12-24 ENCOUNTER — Other Ambulatory Visit: Payer: Self-pay | Admitting: Obstetrics

## 2013-12-24 DIAGNOSIS — K219 Gastro-esophageal reflux disease without esophagitis: Secondary | ICD-10-CM

## 2013-12-24 MED ORDER — OMEPRAZOLE 20 MG PO CPDR: 20.0000 mg | DELAYED_RELEASE_CAPSULE | Freq: Two times a day (BID) | ORAL | Status: DC

## 2013-12-26 ENCOUNTER — Ambulatory Visit (HOSPITAL_COMMUNITY): Payer: Medicaid Other

## 2013-12-26 ENCOUNTER — Ambulatory Visit (INDEPENDENT_AMBULATORY_CARE_PROVIDER_SITE_OTHER): Payer: Medicaid Other | Admitting: Obstetrics & Gynecology

## 2013-12-26 ENCOUNTER — Encounter: Payer: Self-pay | Admitting: Obstetrics & Gynecology

## 2013-12-26 VITALS — BP 119/77 | HR 75 | Temp 98.7°F | Wt 204.0 lb

## 2013-12-26 DIAGNOSIS — A499 Bacterial infection, unspecified: Secondary | ICD-10-CM

## 2013-12-26 DIAGNOSIS — Z3483 Encounter for supervision of other normal pregnancy, third trimester: Secondary | ICD-10-CM

## 2013-12-26 DIAGNOSIS — B9689 Other specified bacterial agents as the cause of diseases classified elsewhere: Secondary | ICD-10-CM

## 2013-12-26 DIAGNOSIS — N760 Acute vaginitis: Secondary | ICD-10-CM

## 2013-12-26 LAB — POCT URINALYSIS DIPSTICK
Bilirubin, UA: NEGATIVE
Blood, UA: NEGATIVE
GLUCOSE UA: NEGATIVE
Ketones, UA: NEGATIVE
LEUKOCYTES UA: NEGATIVE
NITRITE UA: NEGATIVE
Protein, UA: NEGATIVE
UROBILINOGEN UA: NEGATIVE
pH, UA: 7

## 2013-12-26 MED ORDER — METRONIDAZOLE 500 MG PO TABS: 500.0000 mg | ORAL_TABLET | Freq: Two times a day (BID) | ORAL | Status: DC

## 2013-12-27 NOTE — Patient Instructions (Signed)
Third Trimester of Pregnancy The third trimester is from week 29 through week 42, months 7 through 9. The third trimester is a time when the fetus is growing rapidly. At the end of the ninth month, the fetus is about 20 inches in length and weighs 6-10 pounds.  BODY CHANGES Your body goes through many changes during pregnancy. The changes vary from woman to woman.   Your weight will continue to increase. You can expect to gain 25-35 pounds (11-16 kg) by the end of the pregnancy.  You may begin to get stretch marks on your hips, abdomen, and breasts.  You may urinate more often because the fetus is moving lower into your pelvis and pressing on your bladder.  You may develop or continue to have heartburn as a result of your pregnancy.  You may develop constipation because certain hormones are causing the muscles that push waste through your intestines to slow down.  You may develop hemorrhoids or swollen, bulging veins (varicose veins).  You may have pelvic pain because of the weight gain and pregnancy hormones relaxing your joints between the bones in your pelvis. Backaches may result from overexertion of the muscles supporting your posture.  You may have changes in your hair. These can include thickening of your hair, rapid growth, and changes in texture. Some women also have hair loss during or after pregnancy, or hair that feels dry or thin. Your hair will most likely return to normal after your baby is born.  Your breasts will continue to grow and be tender. A yellow discharge may leak from your breasts called colostrum.  Your belly button may stick out.  You may feel short of breath because of your expanding uterus.  You may notice the fetus "dropping," or moving lower in your abdomen.  You may have a bloody mucus discharge. This usually occurs a few days to a week before labor begins.  Your cervix becomes thin and soft (effaced) near your due date. WHAT TO EXPECT AT YOUR PRENATAL  EXAMS  You will have prenatal exams every 2 weeks until week 36. Then, you will have weekly prenatal exams. During a routine prenatal visit:  You will be weighed to make sure you and the fetus are growing normally.  Your blood pressure is taken.  Your abdomen will be measured to track your baby's growth.  The fetal heartbeat will be listened to.  Any test results from the previous visit will be discussed.  You may have a cervical check near your due date to see if you have effaced. At around 36 weeks, your caregiver will check your cervix. At the same time, your caregiver will also perform a test on the secretions of the vaginal tissue. This test is to determine if a type of bacteria, Group B streptococcus, is present. Your caregiver will explain this further. Your caregiver may ask you:  What your birth plan is.  How you are feeling.  If you are feeling the baby move.  If you have had any abnormal symptoms, such as leaking fluid, bleeding, severe headaches, or abdominal cramping.  If you have any questions. Other tests or screenings that may be performed during your third trimester include:  Blood tests that check for low iron levels (anemia).  Fetal testing to check the health, activity level, and growth of the fetus. Testing is done if you have certain medical conditions or if there are problems during the pregnancy. FALSE LABOR You may feel small, irregular contractions that   eventually go away. These are called Braxton Hicks contractions, or false labor. Contractions may last for hours, days, or even weeks before true labor sets in. If contractions come at regular intervals, intensify, or become painful, it is best to be seen by your caregiver.  SIGNS OF LABOR   Menstrual-like cramps.  Contractions that are 5 minutes apart or less.  Contractions that start on the top of the uterus and spread down to the lower abdomen and back.  A sense of increased pelvic pressure or back  pain.  A watery or bloody mucus discharge that comes from the vagina. If you have any of these signs before the 37th week of pregnancy, call your caregiver right away. You need to go to the hospital to get checked immediately. HOME CARE INSTRUCTIONS   Avoid all smoking, herbs, alcohol, and unprescribed drugs. These chemicals affect the formation and growth of the baby.  Follow your caregiver's instructions regarding medicine use. There are medicines that are either safe or unsafe to take during pregnancy.  Exercise only as directed by your caregiver. Experiencing uterine cramps is a good sign to stop exercising.  Continue to eat regular, healthy meals.  Wear a good support bra for breast tenderness.  Do not use hot tubs, steam rooms, or saunas.  Wear your seat belt at all times when driving.  Avoid raw meat, uncooked cheese, cat litter boxes, and soil used by cats. These carry germs that can cause birth defects in the baby.  Take your prenatal vitamins.  Try taking a stool softener (if your caregiver approves) if you develop constipation. Eat more high-fiber foods, such as fresh vegetables or fruit and whole grains. Drink plenty of fluids to keep your urine clear or pale yellow.  Take warm sitz baths to soothe any pain or discomfort caused by hemorrhoids. Use hemorrhoid cream if your caregiver approves.  If you develop varicose veins, wear support hose. Elevate your feet for 15 minutes, 3-4 times a day. Limit salt in your diet.  Avoid heavy lifting, wear low heal shoes, and practice good posture.  Rest a lot with your legs elevated if you have leg cramps or low back pain.  Visit your dentist if you have not gone during your pregnancy. Use a soft toothbrush to brush your teeth and be gentle when you floss.  A sexual relationship may be continued unless your caregiver directs you otherwise.  Do not travel far distances unless it is absolutely necessary and only with the approval  of your caregiver.  Take prenatal classes to understand, practice, and ask questions about the labor and delivery.  Make a trial run to the hospital.  Pack your hospital bag.  Prepare the baby's nursery.  Continue to go to all your prenatal visits as directed by your caregiver. SEEK MEDICAL CARE IF:  You are unsure if you are in labor or if your water has broken.  You have dizziness.  You have mild pelvic cramps, pelvic pressure, or nagging pain in your abdominal area.  You have persistent nausea, vomiting, or diarrhea.  You have a bad smelling vaginal discharge.  You have pain with urination. SEEK IMMEDIATE MEDICAL CARE IF:   You have a fever.  You are leaking fluid from your vagina.  You have spotting or bleeding from your vagina.  You have severe abdominal cramping or pain.  You have rapid weight loss or gain.  You have shortness of breath with chest pain.  You notice sudden or extreme swelling   of your face, hands, ankles, feet, or legs.  You have not felt your baby move in over an hour.  You have severe headaches that do not go away with medicine.  You have vision changes. Document Released: 02/22/2001 Document Revised: 03/05/2013 Document Reviewed: 05/01/2012 [employer] Patient Information 2015 [employer]. This information is not intended to replace advice given to you by your health care provider. Make sure you discuss any questions you have with your health care provider.  

## 2013-12-27 NOTE — Progress Notes (Signed)
Subjective:    Chelsea Haley is a 38 y.o. female being seen today for her obstetrical visit. She is at [redacted]w[redacted]d gestation. Patient reports no complaints. Fetal movement: normal.  Problem List Items Addressed This Visit   None    Visit Diagnoses   Encounter for supervision of other normal pregnancy in third trimester    -  Primary    Relevant Orders       POCT urinalysis dipstick (Completed)    BV (bacterial vaginosis)        Relevant Medications       metroNIDAZOLE (FLAGYL) tablet      Patient Active Problem List   Diagnosis Date Noted  . 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 Previous cesarean delivery affecting pregnancy 08/18/2013    Priority: High  . Unspecified high-risk pregnancy 08/14/2013    Priority: High  . Benign essential HTN 07/17/2012    Priority: High  . Liver hemangioma 02/28/2012    Priority: High  . Depression with anxiety 09/20/2011    Priority: High  . Frequent UTI 09/20/2011    Priority: High  . Palpitations 09/11/2013  . Polycystic ovarian syndrome 01/14/2013  . 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 Pedal edema 11/14/2012  . Allergic rhinitis 11/14/2012  . Vaginitis and vulvovaginitis 09/12/2012  . Skin infection 09/03/2012  . Pre-conception counseling 07/23/2012  . Family planning 07/17/2012  . H/O female genital system disorder 07/17/2012  . Hearing loss 05/16/2012  . Epigastric pain 02/28/2012  . Routine general medical examination at a health care facility 10/25/2011  . Obesity 09/20/2011  . PTSD (post-traumatic stress disorder) 09/20/2011  . Seizure disorder 09/20/2011  . Hyperlipidemia 09/20/2011  . Migraines 09/20/2011   Objective:    BP 119/77  Pulse 75  Temp(Src) 98.7 F (37.1 C)  Wt 92.534 kg (204 lb) FHT:  140 BPM  Uterine Size: size equals dates  Presentation: unsure     Assessment:    Pregnancy @  [redacted]w[redacted]d weeks   Plan:   Start antenatal testing at next visit  Orders Placed This Encounter  Procedures  . POCT urinalysis dipstick    Follow up in 2 Weeks.

## 2014-01-02 ENCOUNTER — Other Ambulatory Visit (HOSPITAL_COMMUNITY): Payer: Self-pay | Admitting: Obstetrics and Gynecology

## 2014-01-02 DIAGNOSIS — O4702 False labor before 37 completed weeks of gestation, second trimester: Secondary | ICD-10-CM

## 2014-01-02 DIAGNOSIS — O09293 Supervision of pregnancy with other poor reproductive or obstetric history, third trimester: Secondary | ICD-10-CM

## 2014-01-02 DIAGNOSIS — Z98891 History of uterine scar from previous surgery: Secondary | ICD-10-CM

## 2014-01-02 DIAGNOSIS — F17210 Nicotine dependence, cigarettes, uncomplicated: Secondary | ICD-10-CM

## 2014-01-02 DIAGNOSIS — O09523 Supervision of elderly multigravida, third trimester: Secondary | ICD-10-CM

## 2014-01-06 ENCOUNTER — Telehealth: Payer: Self-pay | Admitting: *Deleted

## 2014-01-06 NOTE — Telephone Encounter (Signed)
Patient is calling regarding a possible UTI. 5:41 Spoke with patient- she thinks she has the beginnings of a UTI- Dark color and a little discomfort at the end of the urine stream. Offered treatment or lab appointment- she is coming for ROB on Wed. Patient prefers to come in in the AM for urine test- will call on call nurse if symptoms worsen over night.

## 2014-01-07 ENCOUNTER — Other Ambulatory Visit: Payer: Medicaid Other

## 2014-01-07 DIAGNOSIS — R399 Unspecified symptoms and signs involving the genitourinary system: Secondary | ICD-10-CM

## 2014-01-08 ENCOUNTER — Ambulatory Visit (INDEPENDENT_AMBULATORY_CARE_PROVIDER_SITE_OTHER): Payer: Medicaid Other | Admitting: Obstetrics & Gynecology

## 2014-01-08 ENCOUNTER — Encounter: Payer: Self-pay | Admitting: Obstetrics & Gynecology

## 2014-01-08 VITALS — BP 123/83 | HR 94 | Temp 98.6°F | Wt 205.0 lb

## 2014-01-08 DIAGNOSIS — Z3493 Encounter for supervision of normal pregnancy, unspecified, third trimester: Secondary | ICD-10-CM

## 2014-01-08 DIAGNOSIS — N390 Urinary tract infection, site not specified: Secondary | ICD-10-CM

## 2014-01-08 DIAGNOSIS — O09529 Supervision of elderly multigravida, unspecified trimester: Secondary | ICD-10-CM

## 2014-01-08 MED ORDER — SULFAMETHOXAZOLE-TRIMETHOPRIM 800-160 MG PO TABS: 1.0000 | ORAL_TABLET | Freq: Two times a day (BID) | ORAL | Status: DC

## 2014-01-08 NOTE — Progress Notes (Deleted)
Patient reports some pain, but not changing. Some contractions with activities, but otherwise calm.

## 2014-01-09 ENCOUNTER — Ambulatory Visit (HOSPITAL_COMMUNITY)
Admission: RE | Admit: 2014-01-09 | Discharge: 2014-01-09 | Disposition: A | Discharge status: Home / Self Care | Payer: Medicaid Other | Source: Ambulatory Visit | Attending: Family Medicine | Admitting: Family Medicine

## 2014-01-09 VITALS — BP 121/77 | HR 90 | Wt 201.8 lb

## 2014-01-09 DIAGNOSIS — O09523 Supervision of elderly multigravida, third trimester: Secondary | ICD-10-CM

## 2014-01-09 DIAGNOSIS — O99333 Smoking (tobacco) complicating pregnancy, third trimester: Secondary | ICD-10-CM | POA: Insufficient documentation

## 2014-01-09 DIAGNOSIS — O09293 Supervision of pregnancy with other poor reproductive or obstetric history, third trimester: Secondary | ICD-10-CM | POA: Insufficient documentation

## 2014-01-09 DIAGNOSIS — O351XX0 Maternal care for (suspected) chromosomal abnormality in fetus, not applicable or unspecified: Secondary | ICD-10-CM | POA: Insufficient documentation

## 2014-01-09 DIAGNOSIS — Z98891 History of uterine scar from previous surgery: Secondary | ICD-10-CM

## 2014-01-09 DIAGNOSIS — O4702 False labor before 37 completed weeks of gestation, second trimester: Secondary | ICD-10-CM

## 2014-01-09 DIAGNOSIS — O403XX0 Polyhydramnios, third trimester, not applicable or unspecified: Secondary | ICD-10-CM | POA: Diagnosis not present

## 2014-01-09 DIAGNOSIS — O3513X0 Maternal care for (suspected) chromosomal abnormality in fetus, trisomy 21, not applicable or unspecified: Secondary | ICD-10-CM

## 2014-01-09 DIAGNOSIS — Z3A32 32 weeks gestation of pregnancy: Secondary | ICD-10-CM | POA: Insufficient documentation

## 2014-01-09 DIAGNOSIS — F17210 Nicotine dependence, cigarettes, uncomplicated: Secondary | ICD-10-CM

## 2014-01-09 LAB — CULTURE, OB URINE

## 2014-01-09 NOTE — Patient Instructions (Signed)
Third Trimester of Pregnancy The third trimester is from week 29 through week 42, months 7 through 9. The third trimester is a time when the fetus is growing rapidly. At the end of the ninth month, the fetus is about 20 inches in length and weighs 6-10 pounds.  BODY CHANGES Your body goes through many changes during pregnancy. The changes vary from woman to woman.   Your weight will continue to increase. You can expect to gain 25-35 pounds (11-16 kg) by the end of the pregnancy.  You may begin to get stretch marks on your hips, abdomen, and breasts.  You may urinate more often because the fetus is moving lower into your pelvis and pressing on your bladder.  You may develop or continue to have heartburn as a result of your pregnancy.  You may develop constipation because certain hormones are causing the muscles that push waste through your intestines to slow down.  You may develop hemorrhoids or swollen, bulging veins (varicose veins).  You may have pelvic pain because of the weight gain and pregnancy hormones relaxing your joints between the bones in your pelvis. Backaches may result from overexertion of the muscles supporting your posture.  You may have changes in your hair. These can include thickening of your hair, rapid growth, and changes in texture. Some women also have hair loss during or after pregnancy, or hair that feels dry or thin. Your hair will most likely return to normal after your baby is born.  Your breasts will continue to grow and be tender. A yellow discharge may leak from your breasts called colostrum.  Your belly button may stick out.  You may feel short of breath because of your expanding uterus.  You may notice the fetus "dropping," or moving lower in your abdomen.  You may have a bloody mucus discharge. This usually occurs a few days to a week before labor begins.  Your cervix becomes thin and soft (effaced) near your due date. WHAT TO EXPECT AT YOUR PRENATAL  EXAMS  You will have prenatal exams every 2 weeks until week 36. Then, you will have weekly prenatal exams. During a routine prenatal visit:  You will be weighed to make sure you and the fetus are growing normally.  Your blood pressure is taken.  Your abdomen will be measured to track your baby's growth.  The fetal heartbeat will be listened to.  Any test results from the previous visit will be discussed.  You may have a cervical check near your due date to see if you have effaced. At around 36 weeks, your caregiver will check your cervix. At the same time, your caregiver will also perform a test on the secretions of the vaginal tissue. This test is to determine if a type of bacteria, Group B streptococcus, is present. Your caregiver will explain this further. Your caregiver may ask you:  What your birth plan is.  How you are feeling.  If you are feeling the baby move.  If you have had any abnormal symptoms, such as leaking fluid, bleeding, severe headaches, or abdominal cramping.  If you have any questions. Other tests or screenings that may be performed during your third trimester include:  Blood tests that check for low iron levels (anemia).  Fetal testing to check the health, activity level, and growth of the fetus. Testing is done if you have certain medical conditions or if there are problems during the pregnancy. FALSE LABOR You may feel small, irregular contractions that   eventually go away. These are called Braxton Hicks contractions, or false labor. Contractions may last for hours, days, or even weeks before true labor sets in. If contractions come at regular intervals, intensify, or become painful, it is best to be seen by your caregiver.  SIGNS OF LABOR   Menstrual-like cramps.  Contractions that are 5 minutes apart or less.  Contractions that start on the top of the uterus and spread down to the lower abdomen and back.  A sense of increased pelvic pressure or back  pain.  A watery or bloody mucus discharge that comes from the vagina. If you have any of these signs before the 37th week of pregnancy, call your caregiver right away. You need to go to the hospital to get checked immediately. HOME CARE INSTRUCTIONS   Avoid all smoking, herbs, alcohol, and unprescribed drugs. These chemicals affect the formation and growth of the baby.  Follow your caregiver's instructions regarding medicine use. There are medicines that are either safe or unsafe to take during pregnancy.  Exercise only as directed by your caregiver. Experiencing uterine cramps is a good sign to stop exercising.  Continue to eat regular, healthy meals.  Wear a good support bra for breast tenderness.  Do not use hot tubs, steam rooms, or saunas.  Wear your seat belt at all times when driving.  Avoid raw meat, uncooked cheese, cat litter boxes, and soil used by cats. These carry germs that can cause birth defects in the baby.  Take your prenatal vitamins.  Try taking a stool softener (if your caregiver approves) if you develop constipation. Eat more high-fiber foods, such as fresh vegetables or fruit and whole grains. Drink plenty of fluids to keep your urine clear or pale yellow.  Take warm sitz baths to soothe any pain or discomfort caused by hemorrhoids. Use hemorrhoid cream if your caregiver approves.  If you develop varicose veins, wear support hose. Elevate your feet for 15 minutes, 3-4 times a day. Limit salt in your diet.  Avoid heavy lifting, wear low heal shoes, and practice good posture.  Rest a lot with your legs elevated if you have leg cramps or low back pain.  Visit your dentist if you have not gone during your pregnancy. Use a soft toothbrush to brush your teeth and be gentle when you floss.  A sexual relationship may be continued unless your caregiver directs you otherwise.  Do not travel far distances unless it is absolutely necessary and only with the approval  of your caregiver.  Take prenatal classes to understand, practice, and ask questions about the labor and delivery.  Make a trial run to the hospital.  Pack your hospital bag.  Prepare the baby's nursery.  Continue to go to all your prenatal visits as directed by your caregiver. SEEK MEDICAL CARE IF:  You are unsure if you are in labor or if your water has broken.  You have dizziness.  You have mild pelvic cramps, pelvic pressure, or nagging pain in your abdominal area.  You have persistent nausea, vomiting, or diarrhea.  You have a bad smelling vaginal discharge.  You have pain with urination. SEEK IMMEDIATE MEDICAL CARE IF:   You have a fever.  You are leaking fluid from your vagina.  You have spotting or bleeding from your vagina.  You have severe abdominal cramping or pain.  You have rapid weight loss or gain.  You have shortness of breath with chest pain.  You notice sudden or extreme swelling   of your face, hands, ankles, feet, or legs.  You have not felt your baby move in over an hour.  You have severe headaches that do not go away with medicine.  You have vision changes. Document Released: 02/22/2001 Document Revised: 03/05/2013 Document Reviewed: 05/01/2012 [employer] Patient Information 2015 [employer]. This information is not intended to replace advice given to you by your health care provider. Make sure you discuss any questions you have with your health care provider.  

## 2014-01-09 NOTE — Progress Notes (Signed)
Subjective:    Chelsea Haley is a 38 y.o. female being seen today for her obstetrical visit. She is at [redacted]w[redacted]d gestation. Patient reports no complaints. Fetal movement: normal.  Problem List Items Addressed This Visit   None    Visit Diagnoses   Prenatal care, third trimester    -  Primary    Relevant Orders       POCT urinalysis dipstick    UTI (lower urinary tract infection)        Relevant Medications       sulfamethoxazole-trimethoprim (SEPTRA DS) 800-160 MG per tablet      Patient Active Problem List   Diagnosis Date Noted  . 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 Previous cesarean delivery affecting pregnancy 08/18/2013    Priority: High  . Unspecified high-risk pregnancy 08/14/2013    Priority: High  . Benign essential HTN 07/17/2012    Priority: High  . Liver hemangioma 02/28/2012    Priority: High  . Depression with anxiety 09/20/2011    Priority: High  . Frequent UTI 09/20/2011    Priority: High  . Palpitations 09/11/2013  . Polycystic ovarian syndrome 01/14/2013  . 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 Pedal edema 11/14/2012  . Allergic rhinitis 11/14/2012  . Vaginitis and vulvovaginitis 09/12/2012  . Skin infection 09/03/2012  . Pre-conception counseling 07/23/2012  . Family planning 07/17/2012  . H/O female genital system disorder 07/17/2012  . Hearing loss 05/16/2012  . Epigastric pain 02/28/2012  . Routine general medical examination at a health care facility 10/25/2011  . Obesity 09/20/2011  . PTSD (post-traumatic stress disorder) 09/20/2011  . Seizure disorder 09/20/2011  . Hyperlipidemia 09/20/2011  . Migraines 09/20/2011   Objective:    BP 123/83  Pulse 94  Temp(Src) 98.6 F (37 C)  Wt 92.987 kg (205 lb) FHT:  140 BPM  Uterine Size: size equals dates  Presentation: unsure     Assessment:    Pregnancy @ [redacted]w[redacted]d  weeks   Plan:     Orders Placed This Encounter  Procedures  . POCT urinalysis dipstick     Follow up in 1 Week.

## 2014-01-10 ENCOUNTER — Other Ambulatory Visit: Payer: Self-pay | Admitting: Obstetrics & Gynecology

## 2014-01-10 DIAGNOSIS — O3680X0 Pregnancy with inconclusive fetal viability, not applicable or unspecified: Secondary | ICD-10-CM

## 2014-01-10 DIAGNOSIS — O09529 Supervision of elderly multigravida, unspecified trimester: Secondary | ICD-10-CM

## 2014-01-13 ENCOUNTER — Encounter: Payer: Self-pay | Admitting: Obstetrics & Gynecology

## 2014-01-16 ENCOUNTER — Ambulatory Visit (INDEPENDENT_AMBULATORY_CARE_PROVIDER_SITE_OTHER): Payer: Medicaid Other | Admitting: Obstetrics & Gynecology

## 2014-01-16 ENCOUNTER — Encounter: Payer: Self-pay | Admitting: Obstetrics & Gynecology

## 2014-01-16 VITALS — BP 119/86 | HR 91 | Temp 98.7°F | Wt 204.0 lb

## 2014-01-16 DIAGNOSIS — O09529 Supervision of elderly multigravida, unspecified trimester: Secondary | ICD-10-CM

## 2014-01-16 DIAGNOSIS — O403XX0 Polyhydramnios, third trimester, not applicable or unspecified: Secondary | ICD-10-CM | POA: Insufficient documentation

## 2014-01-16 LAB — POCT URINALYSIS DIPSTICK
Bilirubin, UA: NEGATIVE
Blood, UA: NEGATIVE
Glucose, UA: NEGATIVE
Leukocytes, UA: NEGATIVE
Nitrite, UA: NEGATIVE
Protein, UA: NEGATIVE
Spec Grav, UA: 1.01
Urobilinogen, UA: NEGATIVE
pH, UA: 6

## 2014-01-17 LAB — POCT URINALYSIS DIPSTICK
BILIRUBIN UA: NEGATIVE
Glucose, UA: NEGATIVE
Leukocytes, UA: NEGATIVE
Nitrite, UA: NEGATIVE
PH UA: 7
Protein, UA: NEGATIVE
RBC UA: NEGATIVE
Urobilinogen, UA: NEGATIVE

## 2014-01-18 NOTE — Patient Instructions (Signed)
Polyhydramnios °When a woman becomes pregnant, a sac is formed around the fertilized egg (embryo) and later the growing baby (fetus). This sac is called the amniotic sac. The amniotic sac is filled with fluid. It gets bigger as the pregnancy grows. When there is too much fluid in the sac, it is called polyhydramnios. All babies born with polyhydramnios should be looked at for congenital abnormalities. °The amniotic fluid cushions and protects the baby. It also provides the baby with fluids and is crucial to normal development. Your baby breathes this fluid into its lungs and swallows it. This helps promote the healthy growth of the lungs and gastrointestinal tract. Amniotic fluid also helps the baby move around, helping with the normal development of muscle and bone.  °CAUSES  °· Diabetes mellitus. °· Downs Syndrome, fetal abnormalities of the intestinal tract and anencephaly (the fetus has no brain) can prevent the fetus from swallowing the amniotic fluid. °· One twins passes (transfuses) their blood into the other twin (twin-twin transfusion syndrome). °· Medical illness of the mother or heart. °· Kidney disease. °· Tumor (chorioangioma) of the placenta. °SYMPTOMS  °· The uterus enlarges beyond the size it should be for that particular time of the pregnancy. °· The mother may feel more pressure and discomfort than should be expected. °· The mother may notice a quick and unexpected enlargement of her stomach. °DIAGNOSIS  °· Your health care provider notices your uterus is beyond the size that is consistent with the time of the pregnancy when he or she measures you. °· An ultrasound is then used (abdominally or vaginally) to see if there are twins or more, measure the growth of the baby, looks for birth defects and measures the amount of fluid in the amniotic sac. °· Amniotic Fluid Index (AFI). AFI measures the amount of fluid in the amniotic sac in four different areas. If there is more than 24 centimeters, you  have polyhydramnios. °TREATMENT  °· Removing some fluid from the amniotic sac. °· Take medications that lower the fluids in your body. °· Stop using salt or salty foods because it causes you to keep fluid in your body (retention). °· If your health care provider thinks you have polyhydramnios, you will likely need more testing. You will be watched for the rest of your pregnancy. °HOME CARE INSTRUCTIONS  °· Keep all your prenatal appointments. Follow your health care provider's recommendations. °· Do not eat a lot of salt and salty foods. °· If you have diabetes, keep in it control. °· If you have heart or kidney disease, treat the disease as advised by your health care provider. °SEEK MEDICAL CARE IF:  °· You think your uterus has grown too fast in a short period of time. °· You feel a great amount of pressure in your lower belly (pelvis) and are more uncomfortable than expected. °SEEK IMMEDIATE MEDICAL CARE IF:  °· You have a gush of fluid or are leaking fluid from your vagina. °· You stop feeling the baby move. °· You do not feel the baby kicking as much as usual. °· You have a hard time keeping your diabetes under control. °· You are having problems with your heart or kidney disease. °Document Released: 05/21/2002 Document Revised: 12/19/2012 Document Reviewed: 09/27/2012 °[employer]® Patient Information ©2015 [employer]. This information is not intended to replace advice given to you by your health care provider. Make sure you discuss any questions you have with your health care provider. ° °

## 2014-01-18 NOTE — Progress Notes (Signed)
Subjective:    Chelsea Haley is a 38 y.o. female being seen today for her obstetrical visit. She is at [redacted]w[redacted]d gestation. Patient reports no complaints. Fetal movement: normal.  Problem List Items Addressed This Visit    Polyhydramnios in third trimester - Primary   Relevant Orders      POCT urinalysis dipstick (Completed)     Patient Active Problem List   Diagnosis Date Noted  . Polyhydramnios in third trimester 01/16/2014    Priority: High  . 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 Previous cesarean delivery affecting pregnancy 08/18/2013    Priority: High  . Unspecified high-risk pregnancy 08/14/2013    Priority: High  . Benign essential HTN 07/17/2012    Priority: High  . Liver hemangioma 02/28/2012    Priority: High  . Depression with anxiety 09/20/2011    Priority: High  . Frequent UTI 09/20/2011    Priority: High  . Palpitations 09/11/2013  . Polycystic ovarian syndrome 01/14/2013  . 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 Pedal edema 11/14/2012  . Allergic rhinitis 11/14/2012  . Vaginitis and vulvovaginitis 09/12/2012  . Skin infection 09/03/2012  . Pre-conception counseling 07/23/2012  . Family planning 07/17/2012  . H/O female genital system disorder 07/17/2012  . Hearing loss 05/16/2012  . Epigastric pain 02/28/2012  . Routine general medical examination at a health care facility 10/25/2011  . Obesity 09/20/2011  . PTSD (post-traumatic stress disorder) 09/20/2011  . Seizure disorder 09/20/2011  . Hyperlipidemia 09/20/2011  . Migraines 09/20/2011   Objective:    BP 119/86 mmHg  Pulse 91  Temp(Src) 98.7 F (37.1 C)  Wt 92.534 kg (204 lb) FHT:  140 BPM  Uterine Size: size equals dates  Presentation: breech     Assessment:    Pregnancy @ [redacted]w[redacted]d weeks   Plan:     labs reviewed, problem list updated  Orders Placed This  Encounter  Procedures  . POCT urinalysis dipstick    Follow up in twice weekly.

## 2014-01-20 ENCOUNTER — Encounter: Payer: Self-pay | Admitting: Obstetrics & Gynecology

## 2014-01-20 ENCOUNTER — Ambulatory Visit (INDEPENDENT_AMBULATORY_CARE_PROVIDER_SITE_OTHER): Payer: Medicaid Other | Admitting: Obstetrics & Gynecology

## 2014-01-20 VITALS — Temp 98.0°F | Wt 206.0 lb

## 2014-01-20 DIAGNOSIS — O0993 Supervision of high risk pregnancy, unspecified, third trimester: Secondary | ICD-10-CM

## 2014-01-20 LAB — POCT URINALYSIS DIPSTICK
Bilirubin, UA: NEGATIVE
Blood, UA: NEGATIVE
Glucose, UA: NEGATIVE
Ketones, UA: NEGATIVE
Leukocytes, UA: NEGATIVE
Nitrite, UA: NEGATIVE
Protein, UA: NEGATIVE
UROBILINOGEN UA: NEGATIVE
pH, UA: 6

## 2014-01-20 NOTE — Patient Instructions (Signed)
Fetal Biophysical Profile °This is a test that measures five different variables of the fetus: Heart rate, breathing movement, total movement of the baby, fetal muscle tone, the amount of amniotic fluid, and the heart rate activity of the fetus. The five variables are measured individually and contribute either a 2 or a 0 to the overall scoring of the test. The measurements are as follows: °· Fetal heart rate activity. This is measured and scored in the same way as a non-stress test. The fetal heart rate is considered reactive when there are movement-associated fetal heart rate increases of at least 15 beats per minute above baseline, and 15 seconds in duration over a 20-minute period. A score of 2 is given for reactivity, and a score of 0 indicates that the fetal heart rate is non-reactive. °· Fetal breathing movements. This is scored based on fetal breathing movements and indicate fetal well-being. Their absence may indicate a low oxygen level for the fetus. Fetal breathing increases in frequency and uniformity after the 36th week of pregnancy. To earn a score of 2, the fetus must have at least one episode of fetal breathing lasting at least 60 seconds within a 30-minute observation. Absence of this breathing is scored a 0 on the BPP. °· Fetal body movements. Fetal activity is a reflection of brain integrity and function. The presence of at least three episodes of fetal movements within a 30-minute period is given a score of 2. A score of 0 is given with two or less movements in this time period. Fetal activity is highest 1 to 3 hours after the mother has eaten a meal. °· Fetal tone. In the uterus, the fetus is normally in a position of flexion. This means the head is bent down towards the knees. The fetus also stretches, rolls, and moves in the uterus. The arms, legs, trunk, and head may be flexed and extended. A score of 2 is earned when there is at least one episode of active extension with return flexion. A  score of 0 is given for slow extension with a return to only partial flexion. Fetal movement not followed by return to flexion, limbs or spine in extension, and an open fetal hand score 0. °· Amniotic fluid volume. Amniotic fluid volume has been demonstrated to be a good method of predicting fetal distress. Too little amniotic fluid has been associated with fetal abnormalities, slow uterine growth, and over due pregnancy. A score of 2 is given for this when there is at least one pocket of amniotic fluid that measures 1 cm in a specific area. A score of 0 indicates either that fluid is absent in most areas of the uterine cavity or that the largest pocket of fluid measures less than 1 cm. °PREPARATION FOR TEST °No preparation or fasting is necessary. °NORMAL FINDINGS °· A score of 8-10 points (if amniotic fluid volume is adequate). °· Possible critical values: Less than 4 may necessitate immediate delivery of fetus. °Ranges for normal findings may vary among different laboratories and hospitals. You should always check with your doctor after having lab work or other tests done to discuss the meaning of your test results and whether your values are considered within normal limits. °MEANING OF TEST  °Your caregiver will go over the test results with you and discuss the importance and meaning of your results, as well as treatment options and the need for additional tests if necessary. °OBTAINING THE TEST RESULTS  °It is your responsibility to obtain your test   results. Ask the lab or department performing the test when and how you will get your results. °Document Released: 07/01/2004 Document Revised: 05/23/2011 Document Reviewed: 02/08/2008 °[employer]® Patient Information ©2015 [employer]. This information is not intended to replace advice given to you by your health care provider. Make sure you discuss any questions you have with your health care provider. ° °

## 2014-01-20 NOTE — Progress Notes (Signed)
Subjective:    Chelsea Haley is a 38 y.o. female being seen today for her obstetrical visit. She is at [redacted]w[redacted]d gestation. Patient reports no complaints. Fetal movement: normal.  Problem List Items Addressed This Visit    None    Visit Diagnoses    Supervision of high risk pregnancy in third trimester    -  Primary    Relevant Orders       POCT urinalysis dipstick (Completed)       US Fetal BPP W/O Non Stress      Patient Active Problem List   Diagnosis Date Noted  . Polyhydramnios in third trimester 01/16/2014    Priority: High  . 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 Previous cesarean delivery affecting pregnancy 08/18/2013    Priority: High  . Unspecified high-risk pregnancy 08/14/2013    Priority: High  . Benign essential HTN 07/17/2012    Priority: High  . Liver hemangioma 02/28/2012    Priority: High  . Depression with anxiety 09/20/2011    Priority: High  . Frequent UTI 09/20/2011    Priority: High  . Palpitations 09/11/2013  . Polycystic ovarian syndrome 01/14/2013  . 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 Pedal edema 11/14/2012  . Allergic rhinitis 11/14/2012  . Vaginitis and vulvovaginitis 09/12/2012  . Skin infection 09/03/2012  . Pre-conception counseling 07/23/2012  . Family planning 07/17/2012  . H/O female genital system disorder 07/17/2012  . Hearing loss 05/16/2012  . Epigastric pain 02/28/2012  . Routine general medical examination at a health care facility 10/25/2011  . Obesity 09/20/2011  . PTSD (post-traumatic stress disorder) 09/20/2011  . Seizure disorder 09/20/2011  . Hyperlipidemia 09/20/2011  . Migraines 09/20/2011   Objective:    Temp(Src) 98 F (36.7 C)  Wt 93.441 kg (206 lb) FHT:  140 BPM  Uterine Size: size equals dates  Presentation: breech     Assessment:    Pregnancy @ [redacted]w[redacted]d  weeks   Plan:    labs reviewed, problem list updated  Orders Placed This Encounter  Procedures  . US Fetal BPP W/O Non Stress    Standing Status: Future     Number of Occurrences:      Standing Expiration Date: 03/23/2015    Order Specific Question:  Reason for Exam (SYMPTOM  OR DIAGNOSIS REQUIRED)    Answer:  fetal well being    Order Specific Question:  Preferred imaging location?    Answer:  Internal  . POCT urinalysis dipstick    Follow up in twice weekly.

## 2014-01-22 ENCOUNTER — Ambulatory Visit (INDEPENDENT_AMBULATORY_CARE_PROVIDER_SITE_OTHER): Payer: Medicaid Other

## 2014-01-22 ENCOUNTER — Other Ambulatory Visit: Payer: Self-pay | Admitting: Obstetrics & Gynecology

## 2014-01-22 DIAGNOSIS — O09523 Supervision of elderly multigravida, third trimester: Secondary | ICD-10-CM

## 2014-01-22 DIAGNOSIS — O288 Other abnormal findings on antenatal screening of mother: Secondary | ICD-10-CM

## 2014-01-22 DIAGNOSIS — O289 Unspecified abnormal findings on antenatal screening of mother: Secondary | ICD-10-CM

## 2014-01-22 DIAGNOSIS — O0993 Supervision of high risk pregnancy, unspecified, third trimester: Secondary | ICD-10-CM

## 2014-01-22 DIAGNOSIS — R197 Diarrhea, unspecified: Secondary | ICD-10-CM

## 2014-01-23 ENCOUNTER — Encounter: Payer: Medicaid Other | Admitting: Obstetrics & Gynecology

## 2014-01-23 ENCOUNTER — Ambulatory Visit (INDEPENDENT_AMBULATORY_CARE_PROVIDER_SITE_OTHER): Payer: Medicaid Other | Admitting: Obstetrics

## 2014-01-23 ENCOUNTER — Other Ambulatory Visit: Payer: Self-pay | Admitting: Obstetrics

## 2014-01-23 VITALS — BP 119/81 | HR 83 | Wt 205.0 lb

## 2014-01-23 DIAGNOSIS — O09529 Supervision of elderly multigravida, unspecified trimester: Secondary | ICD-10-CM

## 2014-01-23 DIAGNOSIS — R197 Diarrhea, unspecified: Secondary | ICD-10-CM

## 2014-01-23 DIAGNOSIS — Z3493 Encounter for supervision of normal pregnancy, unspecified, third trimester: Secondary | ICD-10-CM

## 2014-01-23 DIAGNOSIS — O403XX0 Polyhydramnios, third trimester, not applicable or unspecified: Secondary | ICD-10-CM

## 2014-01-23 LAB — POCT URINALYSIS DIPSTICK
Blood, UA: NEGATIVE
Glucose, UA: NEGATIVE
Leukocytes, UA: NEGATIVE
Nitrite, UA: NEGATIVE
PROTEIN UA: NEGATIVE
Spec Grav, UA: <=1.005
pH, UA: 7.5

## 2014-01-24 LAB — C. DIFFICILE GDH AND TOXIN A/B
C. DIFFICILE GDH: NOT DETECTED
C. difficile Toxin A/B: NOT DETECTED

## 2014-01-25 ENCOUNTER — Encounter: Payer: Self-pay | Admitting: Obstetrics

## 2014-01-25 NOTE — Progress Notes (Signed)
Subjective:    Chelsea Haley is a 38 y.o. female being seen today for her obstetrical visit. She is at [redacted]w[redacted]d gestation. Patient reports no complaints. Fetal movement: normal.  Problem List Items Addressed This Visit    None    Visit Diagnoses    Diarrhea    -  Primary    Relevant Orders       Clostridium difficile EIA       POCT urinalysis dipstick (Completed)      Patient Active Problem List   Diagnosis Date Noted  . Polyhydramnios in third trimester 01/16/2014  . Palpitations 09/11/2013  . 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 Nodal paroxysmal tachycardia 08/18/2013  . Previous cesarean delivery affecting pregnancy 08/18/2013  . Unspecified high-risk pregnancy 08/14/2013  . Polycystic ovarian syndrome 01/14/2013  . 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 Pedal edema 11/14/2012  . Allergic rhinitis 11/14/2012  . Vaginitis and vulvovaginitis 09/12/2012  . Skin infection 09/03/2012  . Pre-conception counseling 07/23/2012  . Family planning 07/17/2012  . Benign essential HTN 07/17/2012  . H/O female genital system disorder 07/17/2012  . Hearing loss 05/16/2012  . Epigastric pain 02/28/2012  . Liver hemangioma 02/28/2012  . Routine general medical examination at a health care facility 10/25/2011  . Obesity 09/20/2011  . Depression with anxiety 09/20/2011  . PTSD (post-traumatic stress disorder) 09/20/2011  . Seizure disorder 09/20/2011  . Hyperlipidemia 09/20/2011  . Migraines 09/20/2011  . Frequent UTI 09/20/2011   Objective:    BP 119/81 mmHg  Pulse 83  Wt 205 lb (92.987 kg) FHT:  140 BPM  Uterine Size: size equals dates  Presentation: unsure     Assessment:    Pregnancy @ [redacted]w[redacted]d weeks   Plan:     labs reviewed, problem list updated Consent signed. GBS sent TDAP offered  Rhogam given for RH negative Pediatrician: discussed. Infant feeding: plans to breastfeed. Maternity leave: discussed. Cigarette smoking:  quit 2013. Orders Placed This Encounter  Procedures  . Clostridium difficile EIA  . POCT urinalysis dipstick   No orders of the defined types were placed in this encounter.   Follow up in 1 Week.

## 2014-01-27 ENCOUNTER — Encounter (HOSPITAL_COMMUNITY): Payer: Self-pay | Admitting: *Deleted

## 2014-01-27 ENCOUNTER — Encounter: Payer: Medicaid Other | Admitting: Obstetrics & Gynecology

## 2014-01-27 ENCOUNTER — Ambulatory Visit (INDEPENDENT_AMBULATORY_CARE_PROVIDER_SITE_OTHER): Payer: Medicaid Other | Admitting: Obstetrics

## 2014-01-27 ENCOUNTER — Inpatient Hospital Stay (HOSPITAL_COMMUNITY): Payer: Medicaid Other

## 2014-01-27 ENCOUNTER — Inpatient Hospital Stay (HOSPITAL_COMMUNITY)
Admission: AD | Admit: 2014-01-27 | Discharge: 2014-01-27 | Disposition: A | Discharge status: Home / Self Care | Payer: Medicaid Other | Source: Ambulatory Visit | Attending: Obstetrics & Gynecology | Admitting: Obstetrics & Gynecology

## 2014-01-27 VITALS — BP 108/72 | HR 89 | Temp 98.5°F | Wt 207.0 lb

## 2014-01-27 DIAGNOSIS — O403XX0 Polyhydramnios, third trimester, not applicable or unspecified: Secondary | ICD-10-CM | POA: Diagnosis present

## 2014-01-27 DIAGNOSIS — Z87891 Personal history of nicotine dependence: Secondary | ICD-10-CM | POA: Diagnosis not present

## 2014-01-27 DIAGNOSIS — O403XX1 Polyhydramnios, third trimester, fetus 1: Secondary | ICD-10-CM

## 2014-01-27 DIAGNOSIS — IMO0002 Reserved for concepts with insufficient information to code with codable children: Secondary | ICD-10-CM

## 2014-01-27 DIAGNOSIS — O321XX0 Maternal care for breech presentation, not applicable or unspecified: Secondary | ICD-10-CM | POA: Insufficient documentation

## 2014-01-27 DIAGNOSIS — Z3A35 35 weeks gestation of pregnancy: Secondary | ICD-10-CM | POA: Insufficient documentation

## 2014-01-27 DIAGNOSIS — O351XX0 Maternal care for (suspected) chromosomal abnormality in fetus, not applicable or unspecified: Secondary | ICD-10-CM | POA: Diagnosis not present

## 2014-01-27 DIAGNOSIS — I471 Supraventricular tachycardia: Secondary | ICD-10-CM | POA: Diagnosis not present

## 2014-01-27 DIAGNOSIS — O34219 Maternal care for unspecified type scar from previous cesarean delivery: Secondary | ICD-10-CM

## 2014-01-27 DIAGNOSIS — O288 Other abnormal findings on antenatal screening of mother: Secondary | ICD-10-CM | POA: Insufficient documentation

## 2014-01-27 DIAGNOSIS — O0993 Supervision of high risk pregnancy, unspecified, third trimester: Secondary | ICD-10-CM

## 2014-01-27 NOTE — MAU Provider Note (Signed)
History     CSN: [id number]  Arrival date and time: 01/27/14 1813   First Provider Initiated Contact with Patient 01/27/14 1933      Chief Complaint  Patient presents with  . Non-stress Test   HPI This is a 38 y.o. female at [redacted]w[redacted]d who was sent for BPP from office after a nonreactive NST.  Has known polyhydramnios, with AFI 24 last week.   RN Note: Patient states she has twice weekly NST's for polyhydramnios. Did not meet criteria for reactive NST. Sent to MAU for further evaluation. Denies pain, bleeding or leaking. Has an occasional contraction. Reports good fetal movement.       OB History    Gravida Para Term Preterm AB TAB SAB Ectopic Multiple Living   2 1 0 1 0 0 0 0 0 1       Past Medical History  Diagnosis Date  . History of chicken pox   . Depression   . Migraines   . History of kidney stones   . History of seizure disorder     as a child. No medication since age 89.  Marland Kitchen Hyperlipidemia   . Hypertension   . Tachycardia   . History of frequent urinary tract infections   . GERD (gastroesophageal reflux disease)   . Binge eating   . Endometriosis   . Allergy     Past Surgical History  Procedure Laterality Date  . Cholecystectomy  2006  . Cesarean section  2002  . Pilonidal cyst excision  2003  . Laporoscopy    . Wisdom tooth extraction Bilateral     Family History  Problem Relation Age of Onset  . Arthritis Father   . Hypertension Father   . Heart disease Maternal Grandmother     ICD  . Cancer Maternal Grandfather     lung  . Diabetes Neg Hx     History  Substance Use Topics  . Smoking status: Former Smoker -- 0.03 packs/day for 20 years    Types: Cigarettes    Quit date: 10/19/2011  . Smokeless tobacco: Never Used     Comment: 4 cigarettes daily  . Alcohol Use: Not on file    Allergies:  Allergies  Allergen Reactions  . Adhesive [Tape]     Skin sensitivity  . Banana     Migraine, if they are cooked or over ripe.   . Morphine  And Related Itching    hallucinations  . Other Itching    WALNUTS.  Gums itch. Cashews.   . Pertussis Vaccines     seizures  . Tramadol Anxiety    Prescriptions prior to admission  Medication Sig Dispense Refill Last Dose  . acetaminophen (TYLENOL) 500 MG tablet Take 1,000 mg by mouth every 6 (six) hours as needed for headache.   01/27/2014 at Unknown time  . omeprazole (PRILOSEC) 20 MG capsule Take 1 capsule (20 mg total) by mouth 2 (two) times daily before a meal. 60 capsule 5 01/26/2014 at Unknown time  . prenatal vitamin w/FE, FA (NATACHEW) 29-1 MG CHEW chewable tablet Chew 1 tablet by mouth daily at 12 noon. 90 tablet 3 Past Week at Unknown time    Review of Systems  Constitutional: Negative for fever and chills.  Gastrointestinal: Negative for nausea, vomiting and abdominal pain.  Genitourinary:       + FM   Physical Exam   Blood pressure 116/72, pulse 97, resp. rate 16, height 5' 0.5" (1.537 m), weight 207 lb 3.2 oz (  93.985 kg), SpO2 100 %.  Physical Exam  Constitutional: She is oriented to person, place, and time. She appears well-developed and well-nourished. No distress.  HENT:  Head: Normocephalic.  Cardiovascular: Normal rate.   Respiratory: Effort normal.  GI: Soft. There is no tenderness. There is no rebound.  FHR reactive. Rare contractions  Musculoskeletal: Normal range of motion.  Neurological: She is alert and oriented to person, place, and time.  Skin: Skin is warm and dry.  Psychiatric: She has a normal mood and affect.    MAU Course  Procedures  MDM BPP and AFI ordered BPP: 8/8 AFI: 24.43 (no significant change from last week) Pilar Plate Breech  Assessment and Plan  Report to oncoming CNM 1. Polyhydramnios in third trimester, not applicable or unspecified fetus   2. Nuchal fold thickening determined by ultrasound   3. Nodal paroxysmal tachycardia   4. Previous cesarean delivery affecting pregnancy   5. NST (non-stress test) nonreactive   6.  Polyhydramnios in third trimester, fetus 1    Fetal kick counts Continue 2x weekly NST Labor precautions Return to MAU as needed  Follow-up Information    Follow up with HARPER,CHARLES A, MD.   Specialty:  Obstetrics and Gynecology   Why:  As scheduled. Continue with twice weekly visits to the office    Contact information:   [address] [place] [address] [phone]        [facility] 01/27/2014, 7:38 PM

## 2014-01-27 NOTE — Discharge Instructions (Signed)
Polyhydramnios When a woman becomes pregnant, a sac is formed around the fertilized egg (embryo) and later the growing baby (fetus). This sac is called the amniotic sac. The amniotic sac is filled with fluid. It gets bigger as the pregnancy grows. When there is too much fluid in the sac, it is called polyhydramnios. All babies born with polyhydramnios should be looked at for congenital abnormalities. The amniotic fluid cushions and protects the baby. It also provides the baby with fluids and is crucial to normal development. Your baby breathes this fluid into its lungs and swallows it. This helps promote the healthy growth of the lungs and gastrointestinal tract. Amniotic fluid also helps the baby move around, helping with the normal development of muscle and bone.  CAUSES   Diabetes mellitus.  Downs Syndrome, fetal abnormalities of the intestinal tract and anencephaly (the fetus has no brain) can prevent the fetus from swallowing the amniotic fluid.  One twins passes (transfuses) their blood into the other twin (twin-twin transfusion syndrome).  Medical illness of the mother or heart.  Kidney disease.  Tumor (chorioangioma) of the placenta. SYMPTOMS   The uterus enlarges beyond the size it should be for that particular time of the pregnancy.  The mother may feel more pressure and discomfort than should be expected.  The mother may notice a quick and unexpected enlargement of her stomach. DIAGNOSIS   Your health care provider notices your uterus is beyond the size that is consistent with the time of the pregnancy when he or she measures you.  An ultrasound is then used (abdominally or vaginally) to see if there are twins or more, measure the growth of the baby, looks for birth defects and measures the amount of fluid in the amniotic sac.  Amniotic Fluid Index (AFI). AFI measures the amount of fluid in the amniotic sac in four different areas. If there is more than 24 centimeters, you  have polyhydramnios. TREATMENT   Removing some fluid from the amniotic sac.  Take medications that lower the fluids in your body.  Stop using salt or salty foods because it causes you to keep fluid in your body (retention).  If your health care provider thinks you have polyhydramnios, you will likely need more testing. You will be watched for the rest of your pregnancy. HOME CARE INSTRUCTIONS   Keep all your prenatal appointments. Follow your health care provider's recommendations.  Do not eat a lot of salt and salty foods.  If you have diabetes, keep in it control.  If you have heart or kidney disease, treat the disease as advised by your health care provider. SEEK MEDICAL CARE IF:   You think your uterus has grown too fast in a short period of time.  You feel a great amount of pressure in your lower belly (pelvis) and are more uncomfortable than expected. SEEK IMMEDIATE MEDICAL CARE IF:   You have a gush of fluid or are leaking fluid from your vagina.  You stop feeling the baby move.  You do not feel the baby kicking as much as usual.  You have a hard time keeping your diabetes under control.  You are having problems with your heart or kidney disease. Document Released: 05/21/2002 Document Revised: 12/19/2012 Document Reviewed: 09/27/2012 [facility] Patient Information 2015 [place], [place]. This information is not intended to replace advice given to you by your health care provider. Make sure you discuss any questions you have with your health care provider.  Third Trimester of Pregnancy The third  trimester is from week 29 through week 42, months 7 through 9. The third trimester is a time when the fetus is growing rapidly. At the end of the ninth month, the fetus is about 20 inches in length and weighs 6-10 pounds.  BODY CHANGES Your body goes through many changes during pregnancy. The changes vary from woman to woman.   Your weight will continue to increase. You can expect  to gain 25-35 pounds (11-16 kg) by the end of the pregnancy.  You may begin to get stretch marks on your hips, abdomen, and breasts.  You may urinate more often because the fetus is moving lower into your pelvis and pressing on your bladder.  You may develop or continue to have heartburn as a result of your pregnancy.  You may develop constipation because certain hormones are causing the muscles that push waste through your intestines to slow down.  You may develop hemorrhoids or swollen, bulging veins (varicose veins).  You may have pelvic pain because of the weight gain and pregnancy hormones relaxing your joints between the bones in your pelvis. Backaches may result from overexertion of the muscles supporting your posture.  You may have changes in your hair. These can include thickening of your hair, rapid growth, and changes in texture. Some women also have hair loss during or after pregnancy, or hair that feels dry or thin. Your hair will most likely return to normal after your baby is born.  Your breasts will continue to grow and be tender. A yellow discharge may leak from your breasts called colostrum.  Your belly button may stick out.  You may feel short of breath because of your expanding uterus.  You may notice the fetus "dropping," or moving lower in your abdomen.  You may have a bloody mucus discharge. This usually occurs a few days to a week before labor begins.  Your cervix becomes thin and soft (effaced) near your due date. WHAT TO EXPECT AT YOUR PRENATAL EXAMS  You will have prenatal exams every 2 weeks until week 36. Then, you will have weekly prenatal exams. During a routine prenatal visit:  You will be weighed to make sure you and the fetus are growing normally.  Your blood pressure is taken.  Your abdomen will be measured to track your baby's growth.  The fetal heartbeat will be listened to.  Any test results from the previous visit will be discussed.  You  may have a cervical check near your due date to see if you have effaced. At around 36 weeks, your caregiver will check your cervix. At the same time, your caregiver will also perform a test on the secretions of the vaginal tissue. This test is to determine if a type of bacteria, Group B streptococcus, is present. Your caregiver will explain this further. Your caregiver may ask you:  What your birth plan is.  How you are feeling.  If you are feeling the baby move.  If you have had any abnormal symptoms, such as leaking fluid, bleeding, severe headaches, or abdominal cramping.  If you have any questions. Other tests or screenings that may be performed during your third trimester include:  Blood tests that check for low iron levels (anemia).  Fetal testing to check the health, activity level, and growth of the fetus. Testing is done if you have certain medical conditions or if there are problems during the pregnancy. FALSE LABOR You may feel small, irregular contractions that eventually go away. These are  called Braxton Hicks contractions, or false labor. Contractions may last for hours, days, or even weeks before true labor sets in. If contractions come at regular intervals, intensify, or become painful, it is best to be seen by your caregiver.  SIGNS OF LABOR   Menstrual-like cramps.  Contractions that are 5 minutes apart or less.  Contractions that start on the top of the uterus and spread down to the lower abdomen and back.  A sense of increased pelvic pressure or back pain.  A watery or bloody mucus discharge that comes from the vagina. If you have any of these signs before the 37th week of pregnancy, call your caregiver right away. You need to go to the hospital to get checked immediately. HOME CARE INSTRUCTIONS   Avoid all smoking, herbs, alcohol, and unprescribed drugs. These chemicals affect the formation and growth of the baby.  Follow your caregiver's instructions  regarding medicine use. There are medicines that are either safe or unsafe to take during pregnancy.  Exercise only as directed by your caregiver. Experiencing uterine cramps is a good sign to stop exercising.  Continue to eat regular, healthy meals.  Wear a good support bra for breast tenderness.  Do not use hot tubs, steam rooms, or saunas.  Wear your seat belt at all times when driving.  Avoid raw meat, uncooked cheese, cat litter boxes, and soil used by cats. These carry germs that can cause birth defects in the baby.  Take your prenatal vitamins.  Try taking a stool softener (if your caregiver approves) if you develop constipation. Eat more high-fiber foods, such as fresh vegetables or fruit and whole grains. Drink plenty of fluids to keep your urine clear or pale yellow.  Take warm sitz baths to soothe any pain or discomfort caused by hemorrhoids. Use hemorrhoid cream if your caregiver approves.  If you develop varicose veins, wear support hose. Elevate your feet for 15 minutes, 3-4 times a day. Limit salt in your diet.  Avoid heavy lifting, wear low heal shoes, and practice good posture.  Rest a lot with your legs elevated if you have leg cramps or low back pain.  Visit your dentist if you have not gone during your pregnancy. Use a soft toothbrush to brush your teeth and be gentle when you floss.  A sexual relationship may be continued unless your caregiver directs you otherwise.  Do not travel far distances unless it is absolutely necessary and only with the approval of your caregiver.  Take prenatal classes to understand, practice, and ask questions about the labor and delivery.  Make a trial run to the hospital.  Pack your hospital bag.  Prepare the baby's nursery.  Continue to go to all your prenatal visits as directed by your caregiver. SEEK MEDICAL CARE IF:  You are unsure if you are in labor or if your water has broken.  You have dizziness.  You have mild  pelvic cramps, pelvic pressure, or nagging pain in your abdominal area.  You have persistent nausea, vomiting, or diarrhea.  You have a bad smelling vaginal discharge.  You have pain with urination. SEEK IMMEDIATE MEDICAL CARE IF:   You have a fever.  You are leaking fluid from your vagina.  You have spotting or bleeding from your vagina.  You have severe abdominal cramping or pain.  You have rapid weight loss or gain.  You have shortness of breath with chest pain.  You notice sudden or extreme swelling of your face, hands, ankles,  feet, or legs.  You have not felt your baby move in over an hour.  You have severe headaches that do not go away with medicine.  You have vision changes. Document Released: 02/22/2001 Document Revised: 03/05/2013 Document Reviewed: 05/01/2012 [facility] Patient Information 2015 [place], [place]. This information is not intended to replace advice given to you by your health care provider. Make sure you discuss any questions you have with your health care provider.

## 2014-01-27 NOTE — MAU Note (Signed)
Chelsea Haley aware that pt. Is back from ultrasound and pt. May remain off of monitor.

## 2014-01-27 NOTE — MAU Note (Signed)
Urine in lab 

## 2014-01-27 NOTE — MAU Note (Signed)
Patient states she has twice weekly NST's for polyhydramnios. Did not meet criteria for reactive NST. Sent to MAU for further evaluation. Denies pain, bleeding or leaking. Has an occasional contraction. Reports good fetal movement.

## 2014-01-27 NOTE — Progress Notes (Signed)
Subjective:    Chelsea Haley is a 38 y.o. female being seen today for her obstetrical visit. She is at [redacted]w[redacted]d gestation. Patient reports no complaints. Fetal movement: normal.  Problem List Items Addressed This Visit    Polyhydramnios in third trimester    Other Visit Diagnoses    Supervision of high risk pregnancy in third trimester    -  Primary      Patient Active Problem List   Diagnosis Date Noted  . Polyhydramnios in third trimester 01/16/2014  . Palpitations 09/11/2013  . 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 Nodal paroxysmal tachycardia 08/18/2013  . Previous cesarean delivery affecting pregnancy 08/18/2013  . Unspecified high-risk pregnancy 08/14/2013  . Polycystic ovarian syndrome 01/14/2013  . 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 Pedal edema 11/14/2012  . Allergic rhinitis 11/14/2012  . Vaginitis and vulvovaginitis 09/12/2012  . Skin infection 09/03/2012  . Pre-conception counseling 07/23/2012  . Family planning 07/17/2012  . Benign essential HTN 07/17/2012  . H/O female genital system disorder 07/17/2012  . Hearing loss 05/16/2012  . Epigastric pain 02/28/2012  . Liver hemangioma 02/28/2012  . Routine general medical examination at a health care facility 10/25/2011  . Obesity 09/20/2011  . Depression with anxiety 09/20/2011  . PTSD (post-traumatic stress disorder) 09/20/2011  . Seizure disorder 09/20/2011  . Hyperlipidemia 09/20/2011  . Migraines 09/20/2011  . Frequent UTI 09/20/2011   Objective:    BP 108/72 mmHg  Pulse 89  Temp(Src) 98.5 F (36.9 C)  Wt 207 lb (93.895 kg) FHT:  140 BPM  Uterine Size: not measured  Presentation: unsure     Assessment:    Pregnancy @ [redacted]w[redacted]d weeks    AMA  Polyhydramnios  NST:  Nonreactive  Plan:   Sent to [facility] for BPP    labs reviewed, problem list updated Consent signed. GBS sent TDAP offered  Rhogam given for RH negative Pediatrician: discussed. Infant  feeding: plans to breastfeed. Maternity leave: not discussed. Cigarette smoking: quit 2013. No orders of the defined types were placed in this encounter.   No orders of the defined types were placed in this encounter.   Follow up twice weekly.

## 2014-01-28 ENCOUNTER — Other Ambulatory Visit: Payer: Self-pay | Admitting: *Deleted

## 2014-01-28 DIAGNOSIS — Z3A35 35 weeks gestation of pregnancy: Secondary | ICD-10-CM | POA: Insufficient documentation

## 2014-01-28 DIAGNOSIS — O288 Other abnormal findings on antenatal screening of mother: Secondary | ICD-10-CM | POA: Insufficient documentation

## 2014-01-29 ENCOUNTER — Institutional Professional Consult (permissible substitution): Payer: Medicaid Other

## 2014-01-30 ENCOUNTER — Ambulatory Visit (INDEPENDENT_AMBULATORY_CARE_PROVIDER_SITE_OTHER): Payer: Medicaid Other | Admitting: Obstetrics & Gynecology

## 2014-01-30 ENCOUNTER — Encounter: Payer: Self-pay | Admitting: Obstetrics & Gynecology

## 2014-01-30 VITALS — BP 109/75 | HR 77 | Temp 98.6°F | Wt 207.0 lb

## 2014-01-30 DIAGNOSIS — O09529 Supervision of elderly multigravida, unspecified trimester: Secondary | ICD-10-CM

## 2014-01-30 DIAGNOSIS — Z3483 Encounter for supervision of other normal pregnancy, third trimester: Secondary | ICD-10-CM

## 2014-01-30 LAB — POCT URINALYSIS DIPSTICK
BILIRUBIN UA: NEGATIVE
Blood, UA: NEGATIVE
Glucose, UA: NEGATIVE
KETONES UA: NEGATIVE
Leukocytes, UA: NEGATIVE
Nitrite, UA: NEGATIVE
PH UA: 6.5
Protein, UA: NEGATIVE
Spec Grav, UA: <=1.005
Urobilinogen, UA: NEGATIVE

## 2014-01-31 ENCOUNTER — Encounter: Payer: Self-pay | Admitting: Obstetrics & Gynecology

## 2014-01-31 LAB — STREP B DNA PROBE: STREP GROUP B AG: NOT DETECTED

## 2014-01-31 NOTE — Progress Notes (Signed)
Subjective:    Chelsea Haley is a 38 y.o. female being seen today for her obstetrical visit. She is at [redacted]w[redacted]d gestation. Patient reports no complaints. Fetal movement: normal.  Problem List Items Addressed This Visit    None    Visit Diagnoses    Encounter for supervision of other normal pregnancy in third trimester    -  Primary    Relevant Orders       Strep B DNA probe       POCT urinalysis dipstick (Completed)      Patient Active Problem List   Diagnosis Date Noted  . Polyhydramnios in third trimester 01/16/2014    Priority: High  . 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 Previous cesarean delivery affecting pregnancy 08/18/2013    Priority: High  . Unspecified high-risk pregnancy 08/14/2013    Priority: High  . Benign essential HTN 07/17/2012    Priority: High  . Liver hemangioma 02/28/2012    Priority: High  . Depression with anxiety 09/20/2011    Priority: High  . Frequent UTI 09/20/2011    Priority: High  . [redacted] weeks gestation of pregnancy   . NST (non-stress test) nonreactive   . Palpitations 09/11/2013  . Polycystic ovarian syndrome 01/14/2013  . 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 Pedal edema 11/14/2012  . Allergic rhinitis 11/14/2012  . Vaginitis and vulvovaginitis 09/12/2012  . Skin infection 09/03/2012  . Pre-conception counseling 07/23/2012  . Family planning 07/17/2012  . H/O female genital system disorder 07/17/2012  . Hearing loss 05/16/2012  . Epigastric pain 02/28/2012  . Routine general medical examination at a health care facility 10/25/2011  . Obesity 09/20/2011  . PTSD (post-traumatic stress disorder) 09/20/2011  . Seizure disorder 09/20/2011  . Hyperlipidemia 09/20/2011  . Migraines 09/20/2011   Objective:    BP 109/75 mmHg  Pulse 77  Temp(Src) 98.6 F (37 C)  Wt 93.895 kg (207 lb) FHT:  140 BPM  Uterine  Size: not measured  Presentation: unsure     Assessment:    Pregnancy @ [redacted]w[redacted]d weeks      NST:  Nonreactive  Plan:    Orders Placed This Encounter  Procedures  . Strep B DNA probe  . POCT urinalysis dipstick    Follow up twice weekly.

## 2014-02-03 ENCOUNTER — Ambulatory Visit (INDEPENDENT_AMBULATORY_CARE_PROVIDER_SITE_OTHER): Payer: Medicaid Other | Admitting: Obstetrics & Gynecology

## 2014-02-03 ENCOUNTER — Encounter: Payer: Self-pay | Admitting: Obstetrics & Gynecology

## 2014-02-03 VITALS — BP 120/86 | HR 94 | Temp 96.4°F | Wt 209.0 lb

## 2014-02-03 DIAGNOSIS — O09523 Supervision of elderly multigravida, third trimester: Secondary | ICD-10-CM

## 2014-02-03 LAB — POCT URINALYSIS DIPSTICK
BILIRUBIN UA: NEGATIVE
GLUCOSE UA: NEGATIVE
Ketones, UA: NEGATIVE
Leukocytes, UA: NEGATIVE
Nitrite, UA: NEGATIVE
Protein, UA: NEGATIVE
RBC UA: NEGATIVE
SPEC GRAV UA: 1.01
Urobilinogen, UA: NEGATIVE
pH, UA: 6

## 2014-02-04 NOTE — Progress Notes (Signed)
Subjective:    Chelsea Haley is a 38 y.o. female being seen today for her obstetrical visit. She is at [redacted]w[redacted]d gestation. Patient reports no complaints. Fetal movement: normal.  Problem List Items Addressed This Visit    None    Visit Diagnoses    AMA (advanced maternal age) multigravida 14+, third trimester    -  Primary    Relevant Orders       POCT urinalysis dipstick (Completed)       Fetal non-stress test       Culture, OB Urine      Patient Active Problem List   Diagnosis Date Noted  . Polyhydramnios in third trimester 01/16/2014    Priority: High  . 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 Previous cesarean delivery affecting pregnancy 08/18/2013    Priority: High  . Unspecified high-risk pregnancy 08/14/2013    Priority: High  . Benign essential HTN 07/17/2012    Priority: High  . Liver hemangioma 02/28/2012    Priority: High  . Depression with anxiety 09/20/2011    Priority: High  . Frequent UTI 09/20/2011    Priority: High  . Palpitations 09/11/2013  . Polycystic ovarian syndrome 01/14/2013  . 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 Pedal edema 11/14/2012  . Allergic rhinitis 11/14/2012  . Family planning 07/17/2012  . H/O female genital system disorder 07/17/2012  . Hearing loss 05/16/2012  . Obesity 09/20/2011  . PTSD (post-traumatic stress disorder) 09/20/2011  . Seizure disorder 09/20/2011  . Hyperlipidemia 09/20/2011  . Migraines 09/20/2011   Objective:    BP 120/86 mmHg  Pulse 94  Temp(Src) 96.4 F (35.8 C)  Wt 94.802 kg (209 lb) FHT:  140 BPM  Uterine Size: not measured  Presentation: unsure     Assessment:    Pregnancy @ [redacted]w[redacted]d weeks       Plan:    Orders Placed This Encounter  Procedures  . Culture, OB Urine  . Fetal non-stress test    Standing Status: Standing     Number of Occurrences: 1     Standing  Expiration Date:   . POCT urinalysis dipstick    Follow up twice weekly.

## 2014-02-05 ENCOUNTER — Ambulatory Visit (INDEPENDENT_AMBULATORY_CARE_PROVIDER_SITE_OTHER): Payer: Medicaid Other | Admitting: Obstetrics & Gynecology

## 2014-02-05 ENCOUNTER — Ambulatory Visit: Payer: Medicaid Other

## 2014-02-05 ENCOUNTER — Ambulatory Visit (HOSPITAL_COMMUNITY)
Admission: RE | Admit: 2014-02-05 | Discharge: 2014-02-05 | Disposition: A | Discharge status: Home / Self Care | Payer: Medicaid Other | Source: Ambulatory Visit | Attending: Obstetrics & Gynecology | Admitting: Obstetrics & Gynecology

## 2014-02-05 ENCOUNTER — Encounter: Payer: Self-pay | Admitting: Obstetrics & Gynecology

## 2014-02-05 VITALS — BP 128/82 | HR 92 | Temp 98.7°F | Wt 208.0 lb

## 2014-02-05 DIAGNOSIS — Z3403 Encounter for supervision of normal first pregnancy, third trimester: Secondary | ICD-10-CM

## 2014-02-05 DIAGNOSIS — O403XX1 Polyhydramnios, third trimester, fetus 1: Secondary | ICD-10-CM

## 2014-02-05 DIAGNOSIS — O4700 False labor before 37 completed weeks of gestation, unspecified trimester: Secondary | ICD-10-CM | POA: Diagnosis not present

## 2014-02-05 DIAGNOSIS — Z8751 Personal history of pre-term labor: Secondary | ICD-10-CM | POA: Insufficient documentation

## 2014-02-05 DIAGNOSIS — O409XX0 Polyhydramnios, unspecified trimester, not applicable or unspecified: Secondary | ICD-10-CM | POA: Diagnosis not present

## 2014-02-05 DIAGNOSIS — Z720 Tobacco use: Secondary | ICD-10-CM | POA: Insufficient documentation

## 2014-02-05 DIAGNOSIS — O09523 Supervision of elderly multigravida, third trimester: Secondary | ICD-10-CM | POA: Insufficient documentation

## 2014-02-05 DIAGNOSIS — Z3483 Encounter for supervision of other normal pregnancy, third trimester: Secondary | ICD-10-CM

## 2014-02-05 DIAGNOSIS — O289 Unspecified abnormal findings on antenatal screening of mother: Secondary | ICD-10-CM | POA: Insufficient documentation

## 2014-02-05 DIAGNOSIS — Z3A36 36 weeks gestation of pregnancy: Secondary | ICD-10-CM | POA: Diagnosis not present

## 2014-02-05 DIAGNOSIS — O3421 Maternal care for scar from previous cesarean delivery: Secondary | ICD-10-CM | POA: Insufficient documentation

## 2014-02-05 DIAGNOSIS — O09529 Supervision of elderly multigravida, unspecified trimester: Secondary | ICD-10-CM

## 2014-02-05 LAB — CULTURE, OB URINE

## 2014-02-05 NOTE — Progress Notes (Signed)
Patient states she has a different pain- almost like a pinch. Sometimes sharp.

## 2014-02-07 ENCOUNTER — Other Ambulatory Visit: Payer: Self-pay | Admitting: Obstetrics & Gynecology

## 2014-02-07 ENCOUNTER — Ambulatory Visit (HOSPITAL_COMMUNITY)
Admission: RE | Admit: 2014-02-07 | Discharge: 2014-02-07 | Disposition: A | Discharge status: Home / Self Care | Payer: Medicaid Other | Source: Ambulatory Visit | Attending: Obstetrics & Gynecology | Admitting: Obstetrics & Gynecology

## 2014-02-07 ENCOUNTER — Encounter (HOSPITAL_COMMUNITY)
Admission: AD | Disposition: A | Discharge status: Home / Self Care | Payer: Self-pay | Source: Ambulatory Visit | Attending: Obstetrics

## 2014-02-07 ENCOUNTER — Inpatient Hospital Stay (HOSPITAL_COMMUNITY): Payer: Medicaid Other | Admitting: Anesthesiology

## 2014-02-07 ENCOUNTER — Encounter (HOSPITAL_COMMUNITY): Payer: Self-pay | Admitting: *Deleted

## 2014-02-07 ENCOUNTER — Inpatient Hospital Stay (HOSPITAL_COMMUNITY)
Admission: AD | Admit: 2014-02-07 | Discharge: 2014-02-10 | DRG: 765 | Disposition: A | Discharge status: Home / Self Care | Payer: Medicaid Other | Source: Ambulatory Visit | Attending: Obstetrics | Admitting: Obstetrics

## 2014-02-07 DIAGNOSIS — N390 Urinary tract infection, site not specified: Secondary | ICD-10-CM | POA: Diagnosis present

## 2014-02-07 DIAGNOSIS — O3421 Maternal care for scar from previous cesarean delivery: Secondary | ICD-10-CM | POA: Diagnosis present

## 2014-02-07 DIAGNOSIS — B965 Pseudomonas (aeruginosa) (mallei) (pseudomallei) as the cause of diseases classified elsewhere: Secondary | ICD-10-CM | POA: Diagnosis present

## 2014-02-07 DIAGNOSIS — Z3A37 37 weeks gestation of pregnancy: Secondary | ICD-10-CM | POA: Insufficient documentation

## 2014-02-07 DIAGNOSIS — Z87442 Personal history of urinary calculi: Secondary | ICD-10-CM | POA: Diagnosis not present

## 2014-02-07 DIAGNOSIS — O321XX0 Maternal care for breech presentation, not applicable or unspecified: Principal | ICD-10-CM | POA: Diagnosis present

## 2014-02-07 DIAGNOSIS — O99613 Diseases of the digestive system complicating pregnancy, third trimester: Secondary | ICD-10-CM | POA: Diagnosis present

## 2014-02-07 DIAGNOSIS — Z6841 Body Mass Index (BMI) 40.0 and over, adult: Secondary | ICD-10-CM

## 2014-02-07 DIAGNOSIS — Z87891 Personal history of nicotine dependence: Secondary | ICD-10-CM

## 2014-02-07 DIAGNOSIS — O3680X0 Pregnancy with inconclusive fetal viability, not applicable or unspecified: Secondary | ICD-10-CM

## 2014-02-07 DIAGNOSIS — O351XX0 Maternal care for (suspected) chromosomal abnormality in fetus, not applicable or unspecified: Secondary | ICD-10-CM | POA: Insufficient documentation

## 2014-02-07 DIAGNOSIS — O09529 Supervision of elderly multigravida, unspecified trimester: Secondary | ICD-10-CM | POA: Insufficient documentation

## 2014-02-07 DIAGNOSIS — O99214 Obesity complicating childbirth: Secondary | ICD-10-CM | POA: Diagnosis present

## 2014-02-07 DIAGNOSIS — O3513X0 Maternal care for (suspected) chromosomal abnormality in fetus, trisomy 21, not applicable or unspecified: Secondary | ICD-10-CM | POA: Insufficient documentation

## 2014-02-07 DIAGNOSIS — O351XX1 Maternal care for (suspected) chromosomal abnormality in fetus, fetus 1: Secondary | ICD-10-CM

## 2014-02-07 DIAGNOSIS — O3513X1 Maternal care for (suspected) chromosomal abnormality in fetus, trisomy 21, fetus 1: Secondary | ICD-10-CM

## 2014-02-07 DIAGNOSIS — Z98891 History of uterine scar from previous surgery: Secondary | ICD-10-CM

## 2014-02-07 DIAGNOSIS — K219 Gastro-esophageal reflux disease without esophagitis: Secondary | ICD-10-CM | POA: Diagnosis present

## 2014-02-07 DIAGNOSIS — O1002 Pre-existing essential hypertension complicating childbirth: Secondary | ICD-10-CM | POA: Diagnosis present

## 2014-02-07 LAB — POCT URINALYSIS DIPSTICK
Bilirubin, UA: NEGATIVE
Blood, UA: NEGATIVE
GLUCOSE UA: NEGATIVE
Ketones, UA: NEGATIVE
Leukocytes, UA: NEGATIVE
NITRITE UA: NEGATIVE
Protein, UA: NEGATIVE
Spec Grav, UA: 1.02
UROBILINOGEN UA: NEGATIVE
pH, UA: 6

## 2014-02-07 LAB — ABO/RH: ABO/RH(D): O POS

## 2014-02-07 LAB — CBC
HCT: 34 % — ABNORMAL LOW (ref 36.0–46.0)
HEMOGLOBIN: 11.4 g/dL — AB (ref 12.0–15.0)
MCH: 29.7 pg (ref 26.0–34.0)
MCHC: 33.5 g/dL (ref 30.0–36.0)
MCV: 88.5 fL (ref 78.0–100.0)
Platelets: 273 10*3/uL (ref 150–400)
RBC: 3.84 MIL/uL — AB (ref 3.87–5.11)
RDW: 13.7 % (ref 11.5–15.5)
WBC: 12.1 10*3/uL — AB (ref 4.0–10.5)

## 2014-02-07 LAB — TYPE AND SCREEN
ABO/RH(D): O POS
ANTIBODY SCREEN: NEGATIVE

## 2014-02-07 LAB — RPR

## 2014-02-07 SURGERY — Surgical Case
Anesthesia: Spinal

## 2014-02-07 MED ORDER — ONDANSETRON HCL 4 MG/2ML IJ SOLN
INTRAMUSCULAR | Status: DC | PRN
  Administered 2014-02-07: 4 mg via INTRAVENOUS

## 2014-02-07 MED ORDER — FENTANYL CITRATE 0.05 MG/ML IJ SOLN
INTRAMUSCULAR | Status: AC
  Administered 2014-02-07: 50 ug via INTRAVENOUS
  Filled 2014-02-07: qty 2

## 2014-02-07 MED ORDER — BUPIVACAINE IN DEXTROSE 0.75-8.25 % IT SOLN
INTRATHECAL | Status: DC | PRN
  Administered 2014-02-07: 1.4 mL via INTRATHECAL

## 2014-02-07 MED ORDER — NALOXONE HCL 0.4 MG/ML IJ SOLN
0.4000 mg | INTRAMUSCULAR | Status: DC | PRN
Start: 2014-02-07 — End: 2014-02-10

## 2014-02-07 MED ORDER — LACTATED RINGERS IV SOLN
INTRAVENOUS | Status: DC | PRN
  Administered 2014-02-07: 17:00:00 via INTRAVENOUS

## 2014-02-07 MED ORDER — SIMETHICONE 80 MG PO CHEW
80.0000 mg | CHEWABLE_TABLET | Freq: Three times a day (TID) | ORAL | Status: DC
  Administered 2014-02-08 – 2014-02-10 (×7): 80 mg via ORAL
  Filled 2014-02-07 (×7): qty 1

## 2014-02-07 MED ORDER — FENTANYL CITRATE 0.05 MG/ML IJ SOLN
INTRAMUSCULAR | Status: DC | PRN
  Administered 2014-02-07: 87.5 ug via INTRAVENOUS
  Administered 2014-02-07: 12.5 ug via INTRATHECAL

## 2014-02-07 MED ORDER — OXYTOCIN 10 UNIT/ML IJ SOLN
INTRAMUSCULAR | Status: AC
  Filled 2014-02-07: qty 4

## 2014-02-07 MED ORDER — KETOROLAC TROMETHAMINE 30 MG/ML IJ SOLN: 15.0000 mg | Freq: Once | INTRAMUSCULAR | Status: DC | PRN

## 2014-02-07 MED ORDER — ONDANSETRON HCL 4 MG/2ML IJ SOLN: 4.0000 mg | INTRAMUSCULAR | Status: DC | PRN

## 2014-02-07 MED ORDER — PRENATAL MULTIVITAMIN CH
1.0000 | ORAL_TABLET | Freq: Every day | ORAL | Status: DC
  Administered 2014-02-08 – 2014-02-10 (×3): 1 via ORAL
  Filled 2014-02-07 (×3): qty 1

## 2014-02-07 MED ORDER — MENTHOL 3 MG MT LOZG: 1.0000 | LOZENGE | OROMUCOSAL | Status: DC | PRN

## 2014-02-07 MED ORDER — DEXTROSE IN LACTATED RINGERS 5 % IV SOLN
INTRAVENOUS | Status: DC
  Administered 2014-02-07: 13:00:00 via INTRAVENOUS

## 2014-02-07 MED ORDER — PIPERACILLIN-TAZOBACTAM 3.375 G IVPB
3.3750 g | Freq: Three times a day (TID) | INTRAVENOUS | Status: DC
  Administered 2014-02-07 – 2014-02-09 (×7): 3.375 g via INTRAVENOUS
  Filled 2014-02-07 (×9): qty 50

## 2014-02-07 MED ORDER — IBUPROFEN 600 MG PO TABS
600.0000 mg | ORAL_TABLET | Freq: Four times a day (QID) | ORAL | Status: DC
Start: 2014-02-08 — End: 2014-02-10
  Administered 2014-02-08 – 2014-02-10 (×10): 600 mg via ORAL
  Filled 2014-02-07 (×10): qty 1

## 2014-02-07 MED ORDER — SIMETHICONE 80 MG PO CHEW: 80.0000 mg | CHEWABLE_TABLET | ORAL | Status: DC | PRN

## 2014-02-07 MED ORDER — WITCH HAZEL-GLYCERIN EX PADS: 1.0000 "application " | MEDICATED_PAD | CUTANEOUS | Status: DC | PRN

## 2014-02-07 MED ORDER — ONDANSETRON HCL 4 MG/2ML IJ SOLN
INTRAMUSCULAR | Status: AC
  Filled 2014-02-07: qty 2

## 2014-02-07 MED ORDER — DIPHENHYDRAMINE HCL 25 MG PO CAPS: 25.0000 mg | ORAL_CAPSULE | Freq: Four times a day (QID) | ORAL | Status: DC | PRN

## 2014-02-07 MED ORDER — OXYTOCIN 40 UNITS IN LACTATED RINGERS INFUSION - SIMPLE MED: 62.5000 mL/h | INTRAVENOUS | Status: AC

## 2014-02-07 MED ORDER — FENTANYL CITRATE 0.05 MG/ML IJ SOLN
25.0000 ug | INTRAMUSCULAR | Status: DC | PRN
  Administered 2014-02-07 (×2): 50 ug via INTRAVENOUS

## 2014-02-07 MED ORDER — LANOLIN HYDROUS EX OINT: 1.0000 "application " | TOPICAL_OINTMENT | CUTANEOUS | Status: DC | PRN

## 2014-02-07 MED ORDER — SCOPOLAMINE 1 MG/3DAYS TD PT72
MEDICATED_PATCH | TRANSDERMAL | Status: AC
  Administered 2014-02-07: 1.5 mg via TRANSDERMAL
  Filled 2014-02-07: qty 1

## 2014-02-07 MED ORDER — IBUPROFEN 600 MG PO TABS: 600.0000 mg | ORAL_TABLET | Freq: Four times a day (QID) | ORAL | Status: DC | PRN

## 2014-02-07 MED ORDER — PHENYLEPHRINE 8 MG IN D5W 100 ML (0.08MG/ML) PREMIX OPTIME
INJECTION | INTRAVENOUS | Status: AC
  Filled 2014-02-07: qty 100

## 2014-02-07 MED ORDER — MEPERIDINE HCL 25 MG/ML IJ SOLN: 6.2500 mg | INTRAMUSCULAR | Status: DC | PRN

## 2014-02-07 MED ORDER — FENTANYL CITRATE 0.05 MG/ML IJ SOLN
INTRAMUSCULAR | Status: AC
  Filled 2014-02-07: qty 2

## 2014-02-07 MED ORDER — PHENYLEPHRINE 8 MG IN D5W 100 ML (0.08MG/ML) PREMIX OPTIME
INJECTION | INTRAVENOUS | Status: DC | PRN
  Administered 2014-02-07: 60 ug/min via INTRAVENOUS

## 2014-02-07 MED ORDER — NALBUPHINE HCL 10 MG/ML IJ SOLN
INTRAMUSCULAR | Status: AC
  Filled 2014-02-07: qty 1

## 2014-02-07 MED ORDER — HYDROCODONE-ACETAMINOPHEN 5-325 MG PO TABS
1.0000 | ORAL_TABLET | ORAL | Status: DC | PRN
  Administered 2014-02-08 – 2014-02-10 (×7): 1 via ORAL
  Filled 2014-02-07 (×3): qty 1
  Filled 2014-02-07: qty 2
  Filled 2014-02-07 (×3): qty 1

## 2014-02-07 MED ORDER — MORPHINE SULFATE 0.5 MG/ML IJ SOLN
INTRAMUSCULAR | Status: AC
  Filled 2014-02-07: qty 10

## 2014-02-07 MED ORDER — ONDANSETRON HCL 4 MG PO TABS: 4.0000 mg | ORAL_TABLET | ORAL | Status: DC | PRN

## 2014-02-07 MED ORDER — SENNOSIDES-DOCUSATE SODIUM 8.6-50 MG PO TABS
2.0000 | ORAL_TABLET | ORAL | Status: DC
Start: 2014-02-08 — End: 2014-02-10
  Administered 2014-02-08 – 2014-02-09 (×2): 2 via ORAL
  Filled 2014-02-07 (×2): qty 2

## 2014-02-07 MED ORDER — ZOLPIDEM TARTRATE 5 MG PO TABS: 5.0000 mg | ORAL_TABLET | Freq: Every evening | ORAL | Status: DC | PRN

## 2014-02-07 MED ORDER — MEASLES, MUMPS & RUBELLA VAC ~~LOC~~ INJ
0.5000 mL | INJECTION | Freq: Once | SUBCUTANEOUS | Status: DC
  Filled 2014-02-07: qty 0.5

## 2014-02-07 MED ORDER — ONDANSETRON HCL 4 MG/2ML IJ SOLN
4.0000 mg | Freq: Three times a day (TID) | INTRAMUSCULAR | Status: DC | PRN
Start: 2014-02-07 — End: 2014-02-10

## 2014-02-07 MED ORDER — HYDROCODONE-ACETAMINOPHEN 10-325 MG PO TABS: 1.0000 | ORAL_TABLET | ORAL | Status: DC | PRN

## 2014-02-07 MED ORDER — SIMETHICONE 80 MG PO CHEW
80.0000 mg | CHEWABLE_TABLET | ORAL | Status: DC
  Administered 2014-02-07 – 2014-02-09 (×3): 80 mg via ORAL
  Filled 2014-02-07 (×3): qty 1

## 2014-02-07 MED ORDER — SODIUM CHLORIDE 0.9 % IJ SOLN: 3.0000 mL | INTRAMUSCULAR | Status: DC | PRN

## 2014-02-07 MED ORDER — LACTATED RINGERS IV SOLN
INTRAVENOUS | Status: DC
  Administered 2014-02-07 (×5): via INTRAVENOUS

## 2014-02-07 MED ORDER — KETOROLAC TROMETHAMINE 30 MG/ML IJ SOLN: 30.0000 mg | Freq: Four times a day (QID) | INTRAMUSCULAR | Status: DC | PRN

## 2014-02-07 MED ORDER — CITRIC ACID-SODIUM CITRATE 334-500 MG/5ML PO SOLN
30.0000 mL | Freq: Once | ORAL | Status: AC
  Administered 2014-02-07: 30 mL via ORAL
  Filled 2014-02-07: qty 15

## 2014-02-07 MED ORDER — DIBUCAINE 1 % RE OINT: 1.0000 "application " | TOPICAL_OINTMENT | RECTAL | Status: DC | PRN

## 2014-02-07 MED ORDER — OXYTOCIN 10 UNIT/ML IJ SOLN
40.0000 [IU] | INTRAVENOUS | Status: DC | PRN
  Administered 2014-02-07: 40 [IU] via INTRAVENOUS

## 2014-02-07 MED ORDER — MORPHINE SULFATE (PF) 0.5 MG/ML IJ SOLN
INTRAMUSCULAR | Status: DC | PRN
  Administered 2014-02-07: .2 mg via INTRATHECAL

## 2014-02-07 MED ORDER — NALOXONE HCL 1 MG/ML IJ SOLN: 1.0000 ug/kg/h | INTRAVENOUS | Status: DC | PRN

## 2014-02-07 MED ORDER — LACTATED RINGERS IV SOLN
INTRAVENOUS | Status: DC
  Administered 2014-02-08: 200 mL via INTRAVENOUS

## 2014-02-07 MED ORDER — PROMETHAZINE HCL 25 MG/ML IJ SOLN: 6.2500 mg | INTRAMUSCULAR | Status: DC | PRN

## 2014-02-07 MED ORDER — DIPHENHYDRAMINE HCL 50 MG/ML IJ SOLN
INTRAMUSCULAR | Status: AC
  Administered 2014-02-07: 25 mg
  Filled 2014-02-07: qty 1

## 2014-02-07 MED ORDER — SCOPOLAMINE 1 MG/3DAYS TD PT72
1.0000 | MEDICATED_PATCH | Freq: Once | TRANSDERMAL | Status: DC
  Administered 2014-02-07: 1.5 mg via TRANSDERMAL

## 2014-02-07 SURGICAL SUPPLY — 43 items
CANISTER WOUND CARE 500ML ATS (WOUND CARE) IMPLANT
CLAMP CORD UMBIL (MISCELLANEOUS) IMPLANT
CLOTH BEACON ORANGE TIMEOUT ST (SAFETY) ×3 IMPLANT
CONTAINER PREFILL 10% NBF 15ML (MISCELLANEOUS) ×6 IMPLANT
DRAPE SHEET LG 3/4 BI-LAMINATE (DRAPES) IMPLANT
DRSG OPSITE POSTOP 4X10 (GAUZE/BANDAGES/DRESSINGS) ×3 IMPLANT
DRSG VAC ATS LRG SENSATRAC (GAUZE/BANDAGES/DRESSINGS) IMPLANT
DRSG VAC ATS MED SENSATRAC (GAUZE/BANDAGES/DRESSINGS) IMPLANT
DRSG VAC ATS SM SENSATRAC (GAUZE/BANDAGES/DRESSINGS) IMPLANT
DURAPREP 26ML APPLICATOR (WOUND CARE) ×3 IMPLANT
ELECT REM PT RETURN 9FT ADLT (ELECTROSURGICAL) ×3
ELECTRODE REM PT RTRN 9FT ADLT (ELECTROSURGICAL) ×1 IMPLANT
EXTRACTOR VACUUM M CUP 4 TUBE (SUCTIONS) IMPLANT
EXTRACTOR VACUUM M CUP 4' TUBE (SUCTIONS)
GLOVE BIO SURGEON STRL SZ8 (GLOVE) ×6 IMPLANT
GOWN STRL REUS W/TWL LRG LVL3 (GOWN DISPOSABLE) ×6 IMPLANT
KIT ABG SYR 3ML LUER SLIP (SYRINGE) IMPLANT
LIQUID BAND (GAUZE/BANDAGES/DRESSINGS) ×3 IMPLANT
NDL HYPO 25X5/8 SAFETYGLIDE (NEEDLE) ×1 IMPLANT
NEEDLE HYPO 25X5/8 SAFETYGLIDE (NEEDLE) ×3 IMPLANT
NS IRRIG 1000ML POUR BTL (IV SOLUTION) ×3 IMPLANT
PACK C SECTION WH (CUSTOM PROCEDURE TRAY) ×3 IMPLANT
PAD OB MATERNITY 4.3X12.25 (PERSONAL CARE ITEMS) ×3 IMPLANT
RTRCTR C-SECT PINK 25CM LRG (MISCELLANEOUS) ×3 IMPLANT
STAPLER VISISTAT 35W (STAPLE) IMPLANT
SUT GUT PLAIN 0 CT-3 TAN 27 (SUTURE) IMPLANT
SUT MNCRL 0 VIOLET CTX 36 (SUTURE) ×3 IMPLANT
SUT MNCRL AB 4-0 PS2 18 (SUTURE) IMPLANT
SUT MON AB 2-0 CT1 27 (SUTURE) ×3 IMPLANT
SUT MON AB 3-0 SH 27 (SUTURE)
SUT MON AB 3-0 SH27 (SUTURE) IMPLANT
SUT MONOCRYL 0 CTX 36 (SUTURE) ×6
SUT PDS AB 0 CTX 60 (SUTURE) IMPLANT
SUT PLAIN 2 0 XLH (SUTURE) IMPLANT
SUT VIC AB 0 CTX 36 (SUTURE)
SUT VIC AB 0 CTX36XBRD ANBCTRL (SUTURE) IMPLANT
SUT VIC AB 1 CTX 36 (SUTURE) ×3
SUT VIC AB 1 CTX36XBRD ANBCTRL (SUTURE) IMPLANT
SUT VIC AB 2-0 CT1 27 (SUTURE)
SUT VIC AB 2-0 CT1 TAPERPNT 27 (SUTURE) IMPLANT
TOWEL OR 17X24 6PK STRL BLUE (TOWEL DISPOSABLE) ×3 IMPLANT
TRAY FOLEY CATH 14FR (SET/KITS/TRAYS/PACK) ×3 IMPLANT
WATER STERILE IRR 1000ML POUR (IV SOLUTION) ×3 IMPLANT

## 2014-02-07 NOTE — Plan of Care (Signed)
Problem: Phase I Progression Outcomes Goal: Assess per MD/Nurse,Routine-VS,FHR,UC,Head to Toe assess Outcome: Completed/Met Date Met:  02/07/14 Goal: Tolerating diet Outcome: Not Applicable Date Met:  07/16/65 Goal: Adequate progression of labor Outcome: Not Applicable Date Met:  88/93/38 Goal: Medications/IV Fluids N/A Outcome: Completed/Met Date Met:  02/07/14 Goal: Induction meds as ordered Outcome: Not Applicable Date Met:  82/66/66 Goal: FHR checked 5 minutes after meds (ROM) Rupture of Membranes Outcome: Not Applicable Date Met:  48/61/61 Goal: Assess/evaluate labor progress and adequacy Outcome: Not Applicable Date Met:  22/40/01 Goal: Assess/evaluate cervical exam prn (q2hrs in active phase) Outcome: Not Applicable Date Met:  80/97/04 Goal: Assess/evaluate notify MD when complete/8 cm Outcome: Not Applicable Date Met:  49/25/24 Goal: Assess/evaluate effectiveness of pushing Outcome: Not Applicable Date Met:  15/90/17 Goal: Complete instrument count Outcome: Not Applicable Date Met:  24/19/54 Goal: Count of instrument items (Specify in a note) Outcome: Not Applicable Date Met:  24/81/44 Goal: Discharge home if all goals are met Outcome: Not Applicable Date Met:  39/26/59 Goal: Appropriate patient level of comfort Outcome: Not Applicable Date Met:  97/87/76

## 2014-02-07 NOTE — Plan of Care (Signed)
Problem: Phase II Progression Outcomes Goal: Pain controlled on oral analgesia Outcome: Completed/Met Date Met:  02/07/14 Goal: Progress activity as tolerated unless otherwise ordered Outcome: Completed/Met Date Met:  02/07/14 Goal: Afebrile, VS remain stable Outcome: Completed/Met Date Met:  02/07/14 Goal: Incision intact & without signs/symptoms of infection Outcome: Completed/Met Date Met:  02/07/14 Goal: Tolerating diet Outcome: Completed/Met Date Met:  02/07/14     

## 2014-02-07 NOTE — Plan of Care (Signed)
Problem: Phase I Progression Outcomes Goal: Obtain and review prenatal records Outcome: Completed/Met Date Met:  02/07/14 Goal: OOB as tolerated unless otherwise ordered Outcome: Completed/Met Date Met:  02/07/14 Goal: IV Pain medications as ordered Outcome: Not Applicable Date Met:  94/00/05 Goal: Pitocin as ordered Outcome: Not Applicable Date Met:  05/67/88 Goal: Medical plan of care initiated within 2 hrs of admission Outcome: Completed/Met Date Met:  02/07/14

## 2014-02-07 NOTE — Op Note (Signed)
Cesarean Section Procedure Note   Chelsea Haley   02/07/2014  Indications: Breech Presentation and 37 weeks with abnormal UA Doppler Study and recommendation for C/S delivery by MFM.   Pre-operative Diagnosis: breech and repeat c-section.   Post-operative Diagnosis: Same   Surgeon: HARPER,CHARLES A  Assistants: Surgical Technician  Anesthesia: spinal  Procedure Details:  The patient was seen in the Holding Room. The risks, benefits, complications, treatment options, and expected outcomes were discussed with the patient. The patient concurred with the proposed plan, giving informed consent. The patient was identified as Chelsea Haley and the procedure verified as C-Section Delivery. A Time Out was held and the above information confirmed.  After induction of anesthesia, the patient was draped and prepped in the usual sterile manner. A transverse incision was made and carried down through the subcutaneous tissue to the fascia. The fascial incision was made and extended transversely. The fascia was separated from the underlying rectus tissue superiorly and inferiorly. The peritoneum was identified and entered. The peritoneal incision was extended longitudinally. The utero-vesical peritoneal reflection was incised transversely and the bladder flap was bluntly freed from the lower uterine segment. A low transverse uterine incision was made. Delivered from cephalic presentation was a 2510 gram living newborn female infant(s). APGAR (1 MIN):   APGAR (5 MINS):   APGAR (10 MINS):    A cord ph was not sent. The umbilical cord was clamped and cut cord. A sample was obtained for evaluation. The placenta was removed Intact and appeared normal.  The uterine incision was closed with running locked sutures of 1-0 Monocryl. A second imbricating layer of the same suture was placed.  Hemostasis was observed. The paracolic gutters were irrigated. The parieto peritoneum was closed in a running fashion with 2-0  Vicryl.  The fascia was then reapproximated with running sutures of 0 Vicryl.  The skin was closed with staples.  Instrument, sponge, and needle counts were correct prior the abdominal closure and were correct at the conclusion of the case.    Findings: Normal uterus, ovaries and tubes.   Estimated Blood Loss: 819ml  Total IV Fluids: 1578ml   Urine Output: 100CC OF clear urine  Specimens: Placenta to pathology  Complications: no complications  Disposition: PACU - hemodynamically stable.  Maternal Condition: stable   Baby condition / location:  Couplet care / Skin to Skin    Signed: Surgeon(s): Shelly Bombard, MD

## 2014-02-07 NOTE — Plan of Care (Signed)
Problem: Phase II Progression Outcomes Goal: Fetal monitoring per orders Outcome: Not Applicable Date Met:  03/52/48 Goal: Key steps for effective pushing & educate pt/family Outcome: Not Applicable Date Met:  18/59/09 Goal: Frequent position change(s) Outcome: Not Applicable Date Met:  31/12/16 Goal: Empty bladder prn Outcome: Not Applicable Date Met:  24/46/95 Goal: Pushing aids - rope, bar, perineal massage Outcome: Not Applicable Date Met:  10/03/55 Goal: Adjust medications prn Outcome: Not Applicable Date Met:  50/51/83 Goal: Notify MD prior to epidural redose Outcome: Not Applicable Date Met:  35/82/51 Goal: Allow 10 minute rest periods prn Outcome: Not Applicable Date Met:  89/84/21 Goal: Notify MD after 2hr pushing Outcome: Not Applicable Date Met:  05/24/79

## 2014-02-07 NOTE — Lactation Note (Signed)
This note was copied from the chart of Chelsea Haley. Lactation Consultation Note  Patient Name: Chelsea Haley Date: 02/07/2014 Reason for consult: Initial assessment;Infant < 6lbs;Late preterm infant ([redacted] weeks gestation) Mom has a 57 hour old baby weighing 5 1/2 pounds, born at 28 weeks.  Her first baby, now 37 yo, was born at 34 weeks 4 days, spent 3 days in NICU and a total of 5 days in hospital.  Mom both breastfed and pumped for him for 6 months.  Her newborn is STS and sleepy after a recent breastfeeding attempt.  LC encouraged continued STS, cue feedings sooner than q3h if baby cuing, but to hand express her colostrum and spoon feed at least q3h if baby unable to latch.  Mom says she knows how to hand express and has already placed some ebm on baby's lips during attempts (see documentation by RN at 1800). Mom encouraged to feed baby 8-12 times/24 hours and with feeding cues. LC encouraged review of Baby and Me pp 9, 14 and 20-25 for STS and BF information. LC provided [employer] brochure and reviewed [facility] services and list of community and web site resources.     Maternal Data Formula Feeding for Exclusion: No Has patient been taught Hand Expression?: Yes (see RN documentation at 1800 breastfeeding attempt) Does the patient have breastfeeding experience prior to this delivery?: Yes  Feeding Feeding Type: Breast Fed Length of feed: 0 min  LATCH Score/Interventions Latch: Too sleepy or reluctant, no latch achieved, no sucking elicited. Intervention(s): Skin to skin  Audible Swallowing: None Intervention(s): Skin to skin;Hand expression  Type of Nipple: Everted at rest and after stimulation  Comfort (Breast/Nipple): Soft / non-tender     Hold (Positioning): Assistance needed to correctly position infant at breast and maintain latch. Intervention(s): Breastfeeding basics reviewed;Support Pillows;Position options;Skin to skin  LATCH Score: 5 (most recent of two  attempts; baby sleepy and unable to latch)  Lactation Tools Discussed/Used   STS, cue feedings with a minimum of q3h feedings due to LPI and low birth weight status Hand expression, spoon feeding as needed LPI hand=out  Consult Status Date: 02/08/14 Follow-up type: In-patient    Junious Dresser [facility] 02/07/2014, 9:41 PM

## 2014-02-07 NOTE — ED Notes (Signed)
Report given to Wendall Papa, RN.  Pt taken to room 170.Marland Kitchen

## 2014-02-07 NOTE — Plan of Care (Signed)
Problem: Phase I Progression Outcomes Goal: Pain controlled with appropriate interventions Outcome: Completed/Met Date Met:  02/07/14 Goal: Foley catheter patent Outcome: Completed/Met Date Met:  02/07/14 Goal: VS, stable, temp < 100.4 degrees F Outcome: Completed/Met Date Met:  02/07/14 Goal: Initial discharge plan identified Outcome: Completed/Met Date Met:  02/07/14 Goal: Other Phase I Outcomes/Goals Outcome: Completed/Met Date Met:  02/07/14     

## 2014-02-07 NOTE — Anesthesia Postprocedure Evaluation (Signed)
  Anesthesia Post Note  Patient: Chelsea Haley  Procedure(s) Performed: Procedure(s) (LRB): CESAREAN SECTION (N/A)  Anesthesia type: Spinal  Patient location: PACU  Post pain: Pain level controlled  Post assessment: Post-op Vital signs reviewed  Last Vitals:  Filed Vitals:   02/07/14 1815  BP: 110/59  Pulse: 60  Temp:   Resp: 14    Post vital signs: Reviewed  Level of consciousness: awake  Complications: No apparent anesthesia complications

## 2014-02-07 NOTE — Anesthesia Procedure Notes (Signed)
Spinal Patient location during procedure: OR Start time: 02/07/2014 4:18 PM End time: 02/07/2014 4:20 PM Staffing Anesthesiologist: Lyn Hollingshead Preanesthetic Checklist Completed: patient identified, surgical consent, pre-op evaluation, timeout performed, IV checked, risks and benefits discussed and monitors and equipment checked Spinal Block Patient position: sitting Prep: DuraPrep Patient monitoring: heart rate, cardiac monitor, continuous pulse ox and blood pressure Approach: midline Location: L2-3 Injection technique: single-shot Needle Needle type: Pencan  Needle gauge: 24 G Needle length: 9 cm Needle insertion depth: 7 cm Assessment Sensory level: T4 Events: paresthesia Additional Notes R sided but dosed when no paresthesia was present.

## 2014-02-07 NOTE — Transfer of Care (Signed)
Immediate Anesthesia Transfer of Care Note  Patient: Chelsea Haley  Procedure(s) Performed: Procedure(s): CESAREAN SECTION (N/A)  Patient Location: PACU  Anesthesia Type:Spinal  Level of Consciousness: awake, alert  and oriented  Airway & Oxygen Therapy: Patient Spontanous Breathing  Post-op Assessment: Report given to PACU RN and Post -op Vital signs reviewed and stable  Post vital signs: Reviewed and stable  Complications: No apparent anesthesia complications

## 2014-02-07 NOTE — Anesthesia Preprocedure Evaluation (Signed)
Anesthesia Evaluation  Patient identified by MRN, date of birth, ID band Patient awake    Reviewed: Allergy & Precautions, H&P , NPO status , Patient's Chart, lab work & pertinent test results  Airway Mallampati: II  TM Distance: >3 FB Neck ROM: full    Dental no notable dental hx.    Pulmonary former smoker,    Pulmonary exam normal       Cardiovascular hypertension,     Neuro/Psych    GI/Hepatic Neg liver ROS, GERD-  Medicated and Controlled,  Endo/Other  Morbid obesity  Renal/GU negative Renal ROS     Musculoskeletal   Abdominal (+) + obese,   Peds  Hematology negative hematology ROS (+)   Anesthesia Other Findings   Reproductive/Obstetrics (+) Pregnancy                             Anesthesia Physical Anesthesia Plan  ASA: III  Anesthesia Plan: Spinal   Post-op Pain Management:    Induction:   Airway Management Planned:   Additional Equipment:   Intra-op Plan:   Post-operative Plan:   Informed Consent: I have reviewed the patients History and Physical, chart, labs and discussed the procedure including the risks, benefits and alternatives for the proposed anesthesia with the patient or authorized representative who has indicated his/her understanding and acceptance.     Plan Discussed with: CRNA and Surgeon  Anesthesia Plan Comments:         Anesthesia Quick Evaluation

## 2014-02-07 NOTE — H&P (Signed)
Chelsea Haley is a 38 y.o. female presenting for U/S for growth.  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  Delivery recommended by MFM. Maternal Medical History:  Reason for admission: 37 weeks. Breech.  Abnormal UA Dopplers.  Prenatal complications: no prenatal complications Prenatal Complications - Diabetes: none.    OB History    Gravida Para Term Preterm AB TAB SAB Ectopic Multiple Living   2 1 0 1 0 0 0 0 0 1      Past Medical History  Diagnosis Date  . History of chicken pox   . Depression   . Migraines   . History of kidney stones   . History of seizure disorder     as a child. No medication since age 18.  Marland Kitchen Hyperlipidemia   . Hypertension   . Tachycardia   . History of frequent urinary tract infections   . GERD (gastroesophageal reflux disease)   . Binge eating   . Endometriosis   . Allergy    Past Surgical History  Procedure Laterality Date  . Cholecystectomy  2006  . Cesarean section  2002  . Pilonidal cyst excision  2003  . Laporoscopy    . Wisdom tooth extraction Bilateral    Family History: family history includes Arthritis in her father; Cancer in her maternal grandfather; Heart disease in her maternal grandmother; Hypertension in her father. There is no history of Diabetes. Social History:  reports that she quit smoking about 2 years ago. Her smoking use included Cigarettes. She has a .6 pack-year smoking history. She has never used smokeless tobacco. Her alcohol and drug histories are not on file.   Prenatal Transfer Tool  Maternal Diabetes: No Genetic Screening: Abnormal:  Results: Elevated risk of Trisomy 21 Maternal Ultrasounds/Referrals: Abnormal:  Findings:   Other: Referred to MFM for AMA and Trisomy 21 fetus. Fetal Ultrasounds or other Referrals:  Referred to Materal Fetal Medicine  Maternal Substance Abuse:  No Significant Maternal Medications:  None Significant  Maternal Lab Results:  None Other Comments:  None  Review of Systems  All other systems reviewed and are negative.     Blood pressure 118/68, pulse 88, temperature 98.3 F (36.8 C), temperature source Oral, resp. rate 18, height 5' (1.524 m), weight 206 lb (93.441 kg). Maternal Exam:  Abdomen: Patient reports no abdominal tenderness. Fetal presentation: breech     Physical Exam  Constitutional: She is oriented to person, place, and time. She appears well-developed and well-nourished.  HENT:  Head: Normocephalic and atraumatic.  Eyes: Conjunctivae are normal. Pupils are equal, round, and reactive to light.  Neck: Normal range of motion. Neck supple.  Cardiovascular: Normal rate and regular rhythm.   Respiratory: Effort normal.  GI: Soft.  Neurological: She is alert and oriented to person, place, and time.  Skin: Skin is warm and dry.  Psychiatric: She has a normal mood and affect. Her behavior is normal. Judgment and thought content normal.    Prenatal labs: ABO, Rh: --/--/O POS (11/27 1130) Antibody: NEG (11/27 1130) Rubella: 0.85 (06/03 1633) RPR: NON REAC (09/10 1044)  HBsAg: NEGATIVE (06/03 1633)  HIV: NONREACTIVE (09/10 1044)  GBS: NOT DETECTED (11/19 1616)   Assessment/Plan: 37 weeks.   Virgil Slinger A 02/07/2014, 1:31 PM

## 2014-02-08 LAB — CBC
HCT: 28.4 % — ABNORMAL LOW (ref 36.0–46.0)
Hemoglobin: 9.8 g/dL — ABNORMAL LOW (ref 12.0–15.0)
MCH: 30.3 pg (ref 26.0–34.0)
MCHC: 34.5 g/dL (ref 30.0–36.0)
MCV: 87.9 fL (ref 78.0–100.0)
PLATELETS: 230 10*3/uL (ref 150–400)
RBC: 3.23 MIL/uL — ABNORMAL LOW (ref 3.87–5.11)
RDW: 13.5 % (ref 11.5–15.5)
WBC: 12.9 10*3/uL — AB (ref 4.0–10.5)

## 2014-02-08 MED ORDER — MEASLES, MUMPS & RUBELLA VAC ~~LOC~~ INJ
0.5000 mL | INJECTION | Freq: Once | SUBCUTANEOUS | Status: DC
  Filled 2014-02-08: qty 0.5

## 2014-02-08 NOTE — Addendum Note (Signed)
Addendum  created 02/08/14 1553 by Ignacia Bayley, CRNA   Modules edited: Notes Section   Notes Section:  File: [id number]

## 2014-02-08 NOTE — Progress Notes (Signed)
Subjective: Postpartum Day 1: Cesarean Delivery Patient reports tolerating PO.    Objective: Vital signs in last 24 hours: Temp:  [97.9 F (36.6 C)-99.3 F (37.4 C)] 98.6 F (37 C) (11/28 0630) Pulse Rate:  [60-100] 74 (11/28 0630) Resp:  [12-24] 18 (11/28 0630) BP: (104-144)/(53-86) 128/69 mmHg (11/28 0630) SpO2:  [97 %-100 %] 98 % (11/28 0630) Weight:  [206 lb (93.441 kg)-206 lb 12 oz (93.781 kg)] 206 lb (93.441 kg) (11/27 1027)  Physical Exam:  General: alert and no distress Lochia: appropriate Uterine Fundus: firm Incision: healing well DVT Evaluation: No evidence of DVT seen on physical exam.   Recent Labs  02/07/14 1130 02/08/14 0541  HGB 11.4* 9.8*  HCT 34.0* 28.4*    Assessment/Plan: Status post Cesarean section. Doing well postoperatively.  Continue current care.  Marckus Hanover A 02/08/2014, 6:56 AM

## 2014-02-08 NOTE — Plan of Care (Signed)
Problem: Phase I Progression Outcomes Goal: Voiding adequately Outcome: Completed/Met Date Met:  02/08/14 Goal: OOB as tolerated unless otherwise ordered Outcome: Completed/Met Date Met:  02/08/14 Goal: IS, TCDB as ordered Outcome: Completed/Met Date Met:  02/08/14  Problem: Phase II Progression Outcomes Goal: Rh isoimmunization per orders Outcome: Not Applicable Date Met:  37/90/24 Goal: Other Phase II Outcomes/Goals Outcome: Not Applicable Date Met:  09/73/53

## 2014-02-08 NOTE — Anesthesia Postprocedure Evaluation (Signed)
  Anesthesia Post-op Note  Patient: Chelsea Haley  Procedure(s) Performed: Procedure(s): CESAREAN SECTION (N/A)  Patient Location: Mother/Baby  Anesthesia Type:Spinal  Level of Consciousness: awake  Airway and Oxygen Therapy: Patient Spontanous Breathing  Post-op Pain: mild  Post-op Assessment: Patient's Cardiovascular Status Stable and Respiratory Function Stable  Post-op Vital Signs: stable  Last Vitals:  Filed Vitals:   02/08/14 1230  BP: 97/46  Pulse: 67  Temp: 37.1 C  Resp: 20    Complications: No apparent anesthesia complications

## 2014-02-08 NOTE — Progress Notes (Signed)
Called with Dr Jodi Mourning about antibiotic order, he wants to continue zosyn iv every 8 hrs till patient is discharged.

## 2014-02-08 NOTE — Lactation Note (Signed)
This note was copied from the chart of Chelsea Precious Segall. Lactation Consultation Note      Follow up consult with this mom of a [redacted] weeks gestation baby, with trisomy 76, now 20 hours old. Mom has been breast feeding, and was reluctant to supplement baby with formula. I explained how we need to both protect mom's milk supply  With DEP and to also supplement her baby, who will not be able to fully breast feed due to being an early term baby with trisomy 15. Mom agreed. I asked if mom would prefer to use hydrolozised  Formula for 24 hours, until her milk began to transition in, and she said yes. The baby was fed 2 mls of colostrum, and offered 18 mls of Pregestimil 24 cal. The plan is for mom to attemt breast feeding every 3 hours, for 15aion, and she is very familiar with pumping. Mom knows to call for questions/concerns.   Patient Name: Chelsea Haley [id number] Date: 02/08/2014 Reason for consult: Follow-up assessment;Infant < 6lbs;Other (Comment) ([redacted] week gestation baby, early term with trisomy 43)   Maternal Data    Feeding Feeding Type: Formula Nipple Type: Slow - flow  LATCH Score/Interventions                      Lactation Tools Discussed/Used Pump Review: Setup, frequency, and cleaning;Milk Storage (mom advsied to add hand expresion after pumping, especilally for first 48 hours pp) Initiated by:: bedside rn Date initiated:: 02/08/14   Consult Status Consult Status: Follow-up Date: 02/09/14 Follow-up type: In-patient    Tonna Corner 02/08/2014, 3:22 PM

## 2014-02-08 NOTE — Progress Notes (Signed)
Patient was referred for history of depression/anxiety. * Referral screened out by Clinical Social Worker because none of the following criteria appear to apply:  ~ History of anxiety/depression during this pregnancy, or of post-partum depression.  ~ Diagnosis of anxiety and/or depression within last 3 years  ~ History of depression due to pregnancy loss/loss of child  OR * Patient's symptoms currently being treated with medication and/or therapy (with Waymon Amato).  Please contact the Clinical Social Worker if needs arise, or by the patient's request.

## 2014-02-09 MED ORDER — CIPROFLOXACIN HCL 500 MG PO TABS
500.0000 mg | ORAL_TABLET | Freq: Two times a day (BID) | ORAL | Status: DC
  Administered 2014-02-09 – 2014-02-10 (×2): 500 mg via ORAL
  Filled 2014-02-09 (×4): qty 1

## 2014-02-09 MED ORDER — MEASLES, MUMPS & RUBELLA VAC ~~LOC~~ INJ
0.5000 mL | INJECTION | Freq: Once | SUBCUTANEOUS | Status: DC
  Filled 2014-02-09: qty 0.5

## 2014-02-09 NOTE — Plan of Care (Signed)
Problem: Discharge Progression Outcomes Goal: Activity appropriate for discharge plan Outcome: Completed/Met Date Met:  02/09/14 Goal: Tolerating diet Outcome: Completed/Met Date Met:  96/72/89 Goal: Complications resolved/controlled Outcome: Completed/Met Date Met:  02/09/14 Goal: Pain controlled with appropriate interventions Outcome: Completed/Met Date Met:  02/09/14 Goal: Discharge plan in place and appropriate Outcome: Completed/Met Date Met:  02/09/14

## 2014-02-09 NOTE — Lactation Note (Signed)
This note was copied from the chart of Chelsea Yarelly Kuba. Lactation Consultation Note    Follow up consult with this mom and baby, now 31 hours old and 37 2/7 weeks CGA, with trisomy 96. Mom and dad have not spoken to me about her having trisomy 1, but repeatedly speak of how small she is, when referring to her not being able to breast feed well. Mom is pumping and hand expressing, and feeding the baby EBM , and supplementing with formula. The baby took 17 mls Pregestimil with her last feeding. Mom denies questions/cocnerns at this time.   Patient Name: Chelsea Haley [id number] Date: 02/09/2014 Reason for consult: Follow-up assessment   Maternal Data    Feeding Feeding Type: Bottle Fed - Formula (encouraged parents to feed baby closer to 20 ml per feeding)  LATCH Score/Interventions Latch: Too sleepy or reluctant, no latch achieved, no sucking elicited.  Audible Swallowing: None  Type of Nipple: Everted at rest and after stimulation  Comfort (Breast/Nipple): Soft / non-tender     Hold (Positioning): No assistance needed to correctly position infant at breast.  LATCH Score: 6  Lactation Tools Discussed/Used     Consult Status Consult Status: Follow-up Date: 02/10/14 Follow-up type: In-patient    Tonna Corner 02/09/2014, 1:29 PM

## 2014-02-09 NOTE — Plan of Care (Signed)
Problem: Consults Goal: Postpartum Patient Education (See Patient Education module for education specifics.)  Outcome: Completed/Met Date Met:  02/09/14  Problem: Discharge Progression Outcomes Goal: Barriers To Progression Addressed/Resolved Outcome: Completed/Met Date Met:  02/09/14 Goal: Other Discharge Outcomes/Goals Outcome: Not Applicable Date Met:  95/39/67

## 2014-02-09 NOTE — Progress Notes (Addendum)
Subjective: Postpartum Day 2: Cesarean Delivery Patient reports tolerating PO, + flatus and no problems voiding.    Objective: Vital signs in last 24 hours: Temp:  [98 F (36.7 C)-99.3 F (37.4 C)] 98 F (36.7 C) (11/29 0620) Pulse Rate:  [63-78] 63 (11/29 0620) Resp:  [18-20] 18 (11/29 0620) BP: (97-119)/(46-70) 111/58 mmHg (11/29 0620) SpO2:  [97 %-99 %] 99 % (11/28 1755)  Physical Exam:  General: alert and no distress Lochia: appropriate Uterine Fundus: firm Incision: healing well DVT Evaluation: No evidence of DVT seen on physical exam.   Recent Labs  02/07/14 1130 02/08/14 0541  HGB 11.4* 9.8*  HCT 34.0* 28.4*    Assessment/Plan: Status post Cesarean section. Doing well postoperatively.  Pseudomonas UTI.  On Zosyn.  Continue current care.  HARPER,CHARLES A 02/09/2014, 6:51 AM

## 2014-02-10 ENCOUNTER — Ambulatory Visit: Payer: Self-pay

## 2014-02-10 ENCOUNTER — Encounter: Payer: Medicaid Other | Admitting: Obstetrics & Gynecology

## 2014-02-10 ENCOUNTER — Encounter (HOSPITAL_COMMUNITY): Payer: Self-pay | Admitting: Obstetrics

## 2014-02-10 MED ORDER — IBUPROFEN 600 MG PO TABS: 600.0000 mg | ORAL_TABLET | Freq: Four times a day (QID) | ORAL | Status: DC | PRN

## 2014-02-10 MED ORDER — CIPROFLOXACIN HCL 500 MG PO TABS: 500.0000 mg | ORAL_TABLET | Freq: Two times a day (BID) | ORAL | Status: DC

## 2014-02-10 MED ORDER — HYDROCODONE-ACETAMINOPHEN 5-325 MG PO TABS: 1.0000 | ORAL_TABLET | ORAL | Status: DC | PRN

## 2014-02-10 NOTE — Lactation Note (Signed)
This note was copied from the chart of Chelsea Nafisa Olds. Lactation Consultation Note  Visit per request by Janett Billow RN to check hard area on breast. Breasts are filling.  Left breast has knotty area.  Encouraged mother to massage her breasts as she pumps. Mother states she has missed a few pumping session due to visitors. Suggest she set her alarm to pump every 3 hours. If knotty area gets worse, suggest mother ask her RN for ice packs for engorgement.  Patient Name: Chelsea Haley [id number] Date: 02/10/2014 Reason for consult: Follow-up assessment   Maternal Data    Feeding Feeding Type: Bottle Fed - Breast Milk Nipple Type: Slow - flow  LATCH Score/Interventions                      Lactation Tools Discussed/Used Tools: Bottle   Consult Status Consult Status: Follow-up Date: 03/11/14 Follow-up type: In-patient    Vivianne Master [facility] 02/10/2014, 11:24 PM

## 2014-02-10 NOTE — Progress Notes (Signed)
Subjective:    Chelsea Haley is a 38 y.o. female being seen today for her obstetrical visit. She is at [redacted]w[redacted]d gestation. Patient reports no complaints. Fetal movement: normal.  Problem List Items Addressed This Visit    None    Visit Diagnoses    Encounter for supervision of normal first pregnancy in third trimester    -  Primary    Relevant Orders       POCT urinalysis dipstick (Completed)       Fetal non-stress test    Encounter for supervision of other normal pregnancy in third trimester        Relevant Orders       POCT urinalysis dipstick (Completed)       Fetal non-stress test      Patient Active Problem List   Diagnosis Date Noted  . Polyhydramnios in third trimester 01/16/2014    Priority: High  . 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 Previous cesarean delivery affecting pregnancy 08/18/2013    Priority: High  . Unspecified high-risk pregnancy 08/14/2013    Priority: High  . Benign essential HTN 07/17/2012    Priority: High  . Liver hemangioma 02/28/2012    Priority: High  . Depression with anxiety 09/20/2011    Priority: High  . Frequent UTI 09/20/2011    Priority: High  . Breech presentation 02/07/2014  . [redacted] weeks gestation of pregnancy   . Elderly multigravida with antepartum condition or complication   . Trisomy 21, fetal, affecting care of mother, antepartum   . Palpitations 09/11/2013  . Polycystic ovarian syndrome 01/14/2013  . 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 Pedal edema 11/14/2012  . Allergic rhinitis 11/14/2012  . Family planning 07/17/2012  . H/O female genital system disorder 07/17/2012  . Hearing loss 05/16/2012  . Obesity 09/20/2011  . PTSD (post-traumatic stress disorder) 09/20/2011  . Seizure disorder 09/20/2011  . Hyperlipidemia 09/20/2011  . Migraines 09/20/2011   Objective:    BP 128/82 mmHg  Pulse 92   Temp(Src) 98.7 F (37.1 C)  Wt 94.348 kg (208 lb)  LMP 04/26/2013 FHT:  140 BPM  Uterine Size: not measured  Presentation: unsure     Assessment:    Pregnancy @ [redacted]w[redacted]d weeks    Non-reactive NST    Plan:    Orders Placed This Encounter  Procedures  . Fetal non-stress test    Standing Status: Standing     Number of Occurrences: 1     Standing Expiration Date:   . POCT urinalysis dipstick  BPP today  Follow up twice weekly.

## 2014-02-10 NOTE — Patient Instructions (Signed)

## 2014-02-10 NOTE — Lactation Note (Signed)
This note was copied from the chart of Chelsea Haley. Lactation Consultation Note  Mom's milk is coming to volume.  Chelsea Haley is taking expressed BM or formula via bottle.  She tongue thrusts so showed Dad how to do tongue exercises to encourage her to take more of the nipple into her mouth. Mom tried to BF her and though she opens her mouth wide she did not grasp the breast because her tongue was not on the floor of her mouth.  I encouraged mom to keep trying and to hold the baby skin to skin often to bring out inborn feeding behaviors. Encouraged mom to continue pumping.   Follow-up planned.  Patient Name: Chelsea Haley [id number] Date: 02/10/2014     Maternal Data    Feeding Feeding Type: Breast Fed Nipple Type: Slow - flow Length of feed: 35 min  LATCH Score/Interventions Latch: Too sleepy or reluctant, no latch achieved, no sucking elicited.  Audible Swallowing: None  Type of Nipple: Everted at rest and after stimulation  Comfort (Breast/Nipple): Soft / non-tender     Hold (Positioning): Assistance needed to correctly position infant at breast and maintain latch.  LATCH Score: 5  Lactation Tools Discussed/Used     Consult Status      Van Clines 02/10/2014, 9:21 AM

## 2014-02-10 NOTE — Progress Notes (Signed)
Subjective: Postpartum Day 3: Cesarean Delivery Patient reports tolerating PO, + flatus, + BM and no problems voiding.    Objective: Vital signs in last 24 hours: Temp:  [98 F (36.7 C)-98.2 F (36.8 C)] 98.2 F (36.8 C) (11/29 1753) Pulse Rate:  [63-66] 66 (11/29 1753) Resp:  [18] 18 (11/29 1753) BP: (111-119)/(58-65) 119/65 mmHg (11/29 1753)  Physical Exam:  General: alert and no distress Lochia: appropriate Uterine Fundus: firm Incision: healing well DVT Evaluation: No evidence of DVT seen on physical exam.   Recent Labs  02/07/14 1130 02/08/14 0541  HGB 11.4* 9.8*  HCT 34.0* 28.4*    Assessment/Plan: Status post Cesarean section. Doing well postoperatively.  Discharge home with standard precautions and return to clinic in 2 weeks.  HARPER,CHARLES A 02/10/2014, 5:28 AM

## 2014-02-10 NOTE — Discharge Summary (Signed)
Obstetric Discharge Summary Reason for Admission: onset of labor Prenatal Procedures: NST and ultrasound Intrapartum Procedures: cesarean: low cervical, transverse Postpartum Procedures: none Complications-Operative and Postpartum: UTI HEMOGLOBIN  Date Value Ref Range Status  02/08/2014 9.8* 12.0 - 15.0 g/dL Final   HCT  Date Value Ref Range Status  02/08/2014 28.4* 36.0 - 46.0 % Final    Physical Exam:  General: alert and no distress Lochia: appropriate Uterine Fundus: firm Incision: healing well DVT Evaluation: No evidence of DVT seen on physical exam.  Discharge Diagnoses: Term Pregnancy-delivered                                          Complicated UTI  Discharge Information: Date: 02/10/2014 Activity: pelvic rest Diet: routine Medications: PNV, Ibuprofen, Colace and Vicodin Cipro Condition: stable Instructions: refer to practice specific booklet Discharge to: home Follow-up Information    Follow up with Lahoma Crocker A, MD. Schedule an appointment as soon as possible for a visit in 2 weeks.   Specialty:  Obstetrics and Gynecology   Contact information:   [address] [place] [address] [phone]       Newborn Data: Live born female  Birth Weight: 5 lb 8.5 oz (2509 g) APGAR: 8, 9  Home with mother.  Brittnay Pigman A 02/10/2014, 6:28 AM

## 2014-02-11 ENCOUNTER — Ambulatory Visit: Payer: Self-pay

## 2014-02-11 NOTE — Lactation Note (Signed)
This note was copied from the chart of Chelsea Nikiah Goin. Lactation Consultation Note Baby with down syndrome having difficutly staying latched to breast. Mom has been supplementing w/breast milk and formula. Mom prefers not to have to use formula and just use breast milk. Mom also wishes to BF instead of pumping and bottle feeding. Explained baby is small and tires easily, and has a tiny mouth. Mom has slightly large nipples, (Lt. Larger than Rt.). Rt. Nipple no shaft for deep latch. Moms milk is in. Fitted mom w/#20 NS and used SNS w/breast milk fed through NS. Baby latched well. Needed frequent stimulation to suck at breast. Reminded mom that baby needs rest breaks, and not to over stimulate baby in feeding over 30 min.  Feeding plan written to mom. SNS allows mom to BF using NS, baby gets supplement stimulating breast as well. Mom has used NS in past with her first child. Comfortable using them.  Wear shells in between feedings. Has comfort gels for nipple irritation from shallow latches after birth.  Encouraged to call for F/U LC appt. D/t wearing NS, SNS, and downs baby. Mom stated she would. FOB very supportive,  Mom stated she was waiting on the genetic testing to come back for a definite Dx: of Downs Syndrome. Mom stated that she is short and she thinks the baby takes after her. Baby is 37 wks. So reviewed LPI information sheet for newborn behavior. [facility] [place] [facility] will be f/u at home.  Patient Name: Chelsea Haley [id number] Date: 02/11/2014 Reason for consult: Follow-up assessment;Difficult latch   Maternal Data    Feeding Feeding Type: Breast Fed Length of feed: 20 min  LATCH Score/Interventions Latch: Repeated attempts needed to sustain latch, nipple held in mouth throughout feeding, stimulation needed to elicit sucking reflex. Intervention(s): Skin to skin;Teach feeding cues;Waking techniques Intervention(s): Adjust position;Assist with latch;Breast massage;Breast  compression  Audible Swallowing: A few with stimulation Intervention(s): Skin to skin;Hand expression Intervention(s): Alternate breast massage  Type of Nipple: Everted at rest and after stimulation  Comfort (Breast/Nipple): Filling, red/small blisters or bruises, mild/mod discomfort  Problem noted: Mild/Moderate discomfort Interventions (Mild/moderate discomfort): Hand massage;Hand expression;Post-pump;Comfort gels;Breast shields  Hold (Positioning): Assistance needed to correctly position infant at breast and maintain latch. Intervention(s): Breastfeeding basics reviewed;Support Pillows;Position options;Skin to skin  LATCH Score: 6  Lactation Tools Discussed/Used Tools: Shells;Nipple Shields;Pump;Supplemental Nutrition System;Comfort gels Nipple shield size: 20;24 Shell Type: Inverted Breast pump type: Double-Electric Breast Pump   Consult Status Consult Status: Complete Date: 02/11/14 Follow-up type: Call as needed    Theodoro Kalata 02/11/2014, 12:05 PM

## 2014-02-12 ENCOUNTER — Ambulatory Visit (INDEPENDENT_AMBULATORY_CARE_PROVIDER_SITE_OTHER): Payer: Medicaid Other | Admitting: Obstetrics

## 2014-02-13 ENCOUNTER — Encounter: Payer: Medicaid Other | Admitting: Obstetrics & Gynecology

## 2014-02-13 ENCOUNTER — Encounter: Payer: Self-pay | Admitting: Obstetrics

## 2014-02-13 NOTE — Progress Notes (Signed)
Postpartum Incision Check:  No complaints. PE;      Incision C, D, and Intact.   SA/P:  S/P LTCS for Breech Presentation and absence of UA - EDF with Dopplers.  Doing well.  F/U 2 weeks.  Baltazar Najjar MD

## 2014-02-17 ENCOUNTER — Encounter: Payer: Medicaid Other | Admitting: Obstetrics & Gynecology

## 2014-02-19 ENCOUNTER — Ambulatory Visit (HOSPITAL_COMMUNITY): Payer: Medicaid Other

## 2014-02-20 ENCOUNTER — Encounter: Payer: Medicaid Other | Admitting: Obstetrics & Gynecology

## 2014-02-20 ENCOUNTER — Ambulatory Visit (INDEPENDENT_AMBULATORY_CARE_PROVIDER_SITE_OTHER): Payer: Medicaid Other | Admitting: Obstetrics & Gynecology

## 2014-02-20 ENCOUNTER — Encounter: Payer: Self-pay | Admitting: Obstetrics

## 2014-02-20 ENCOUNTER — Ambulatory Visit: Payer: Medicaid Other | Admitting: Obstetrics

## 2014-02-20 VITALS — BP 162/91 | HR 64 | Temp 97.1°F | Ht 60.0 in | Wt 200.0 lb

## 2014-02-20 DIAGNOSIS — Z3040 Encounter for surveillance of contraceptives, unspecified: Secondary | ICD-10-CM

## 2014-02-20 MED ORDER — NORETHINDRONE 0.35 MG PO TABS: 1.0000 | ORAL_TABLET | Freq: Every day | ORAL | Status: DC

## 2014-02-21 ENCOUNTER — Ambulatory Visit (HOSPITAL_COMMUNITY)
Admission: RE | Admit: 2014-02-21 | Discharge: 2014-02-21 | Disposition: A | Discharge status: Home / Self Care | Payer: Medicaid Other | Source: Ambulatory Visit | Attending: Obstetrics & Gynecology | Admitting: Obstetrics & Gynecology

## 2014-02-21 NOTE — Progress Notes (Signed)
Subjective:     Chelsea Haley is a 38 y.o. female who presents for a postpartum visit. She is 2 weeks postpartum following a low cervical transverse Cesarean section. I have fully reviewed the prenatal and intrapartum course. The delivery was at term. Outcome: primary cesarean section, low transverse incision. Anesthesia: spinal. Postpartum course has been uneventful. Baby's course has been normal. Baby is feeding by bottle. Bleeding staining only. Bowel function is normal. Bladder function is normal. Patient is not sexually active. Contraception method is abstinence. Postpartum depression screening: negative.  Tobacco, alcohol and substance abuse history reviewed.  Adult immunizations reviewed including TDAP, rubella and varicella.  The following portions of the patient's history were reviewed and updated as appropriate: allergies, current medications, past family history, past medical history, past social history, past surgical history and problem list.  Review of Systems Pertinent items are noted in HPI.   Objective:    BP 162/91 mmHg  Pulse 64  Temp(Src) 97.1 F (36.2 C)  Ht 5' (1.524 m)  Wt 90.719 kg (200 lb)  BMI 39.06 kg/m2  Breastfeeding? Yes  General:  alert  Abdomen: incision well-healed           Assessment:     Normal postpartum exam.  Plan:   Contraception: oral progesterone-only contraceptive  Follow up in: 1 month or as needed.

## 2014-02-21 NOTE — Lactation Note (Signed)
Lactation Consult  Mother's reason for visit:  Latching difficulty Visit Type:  Feeding assessment Appointment Notes:  None Consult:  Initial Lactation Consultant:  Ave Filter  ________________________________________________________________________    7 Name: Chelsea Haley Date of Birth: 02/07/2013 Pediatrician: [facility] Gender: female Gestational Age: [redacted]w[redacted]d (At Birth) Birth Weight: 5 lb 8.5 oz (2509 g) Weight at Discharge: Weight: 5 lb 3.8 oz (2375 g)Date of Discharge: 02/11/2014 [facility] Weights   02/08/14 2320 02/09/14 2330 02/10/14 2324  Weight: 5 lb 4.7 oz (2400 g) 5 lb 3.6 oz (2370 g) 5 lb 3.8 oz (2375 g)   Last weight taken from location outside of [facility]: 5-13 1/2 Location:Pediatrician's office Weight today: 5-12.2    ________________________________________________________________________  Mother's Name: Chelsea Haley Type of delivery:  C/S Breastfeeding Experience:  Breastfed first baby for 6 months Maternal Medical Conditions: None Maternal Medications:  Hydrocodone, ibuprofen, PNV'S  ________________________________________________________________________  Breastfeeding History (Post Discharge)  Frequency of breastfeeding:  Attempting only Duration of feeding:  n/a  Supplementation  Formula:  Volume 60-75ml Frequency:  Every 1-3 hours or      Breastmilk:  Volume 60-6ml Frequency:  Every 1-3 hours Method:  Bottle,   Pumping  Type of pump:  Symphony Frequency:  Every 3-4 hours Volume:  60-90 mls  Infant Intake and Output Assessment  Voids:  8-12 in 24 hrs.  Color:  Clear yellow Stools:  8-12 in 24 hrs.  Color:  Yellow  ________________________________________________________________________  Maternal Breast Assessment  Breast:  Full Nipple:  Erect Pain level:  0 Pain interventions: n/a  _______________________________________________________________________ Feeding  Assessment/Evaluation  Mom and her 2 week newborn here for feeding assist.  Baby was delivered at 71 weeks and has been diagnosed with Down's Syndrome.  Mom has a good milk supply and baby is gaining very well.  Parents state that baby does very well with bottle feeding but unable to latch to breast.  Mom's goal today is to obtain assist with latching.  Baby alert and muscle tone good.  Assisted with positioning baby skin to skin in cross cradle hold on left breast.  Mom attempting to latch baby in cradle hold but awkward so assisted with cross cradle.  Breast compresses easily and baby latched easily but not able to sustain latch.  20 mm nipple shield applied and baby latched well and sustained latch with active sucking for 30 minutes.  Nipple shield full of milk when baby came off.  Assisted with positioning baby in football hold on right breast using a 24 mm nipple shield due to larger nipple size.  Baby nursed actively for 10 minutes but shield needed to be held in baby's mouth for a deep latch.  Baby only transferred 20 mls after what appeared to be a good feeding.  Mom did pump one hour prior to this feeding.  Plan is to continue to feed baby with cues.  Breastfeed baby every other feeding followed by pc bottle attempting to give 60-90 mls.  Pump both breasts at least 8 times in 24 hours.  Follow up appointment scheduled in one week,  Initial feeding assessment:  Infant's oral assessment:  WNL  Positioning:  Cross cradle and Football Right breast, Left breast  LATCH documentation:  Latch:  2 = Grasps breast easily, tongue down, lips flanged, rhythmical sucking.  With a 20 mm nipple shield  Audible swallowing:  2 = Spontaneous and intermittent  Type of nipple:  2 = Everted at rest and after stimulation  Comfort (Breast/Nipple):  2  Hold (Positioning):  1 = Assistance needed to correctly position infant at breast and maintain latch  LATCH score: 9    Attached assessment:  Deep  Lips flanged:   Yes.    Lips untucked:  No.  Suck assessment:  Nutritive  Tools:  Nipple shield 20 mm on left and 24 mm nipple shield on right Instructed on use and cleaning of tool:  Yes.    Pre-feed weight:  2614 g Post-feed weight:  2634 g Amount transferred:  20 ml Amount supplemented:  30 ml      Total amount transferred:  20 ml Total supplement given:  30 ml

## 2014-02-28 ENCOUNTER — Ambulatory Visit (HOSPITAL_COMMUNITY)
Admission: RE | Admit: 2014-02-28 | Discharge: 2014-02-28 | Disposition: A | Discharge status: Home / Self Care | Payer: Medicaid Other | Source: Ambulatory Visit | Attending: Family Medicine | Admitting: Family Medicine

## 2014-02-28 NOTE — Lactation Note (Signed)
Lactation Consult  Mother's reason for visit:  Assist with feeding Visit Type:  Feeding assessment Appointment Notes:  Baby has Downs Syndrome Consult:  Follow-Up Lactation Consultant:  Ave Filter  ________________________________________________________________________   Chelsea Haley Name: Chelsea Haley Date of Birth: 02/07/2013 Pediatrician: [facility] Gender: female Gestational Age: [redacted]w[redacted]d (At Birth) Birth Weight: 5 lb 8.5 oz (2509 g) Weight at Discharge: Weight: 5 lb 3.8 oz (2375 g)Date of Discharge: 02/11/2014 [facility] Weights   02/08/14 2320 02/09/14 2330 02/10/14 2324  Weight: 5 lb 4.7 oz (2400 g) 5 lb 3.6 oz (2370 g) 5 lb 3.8 oz (2375 g)    Weight today: 6-7.3  ________________________________________________________________________  Mother's Name: Chelsea Haley Type of delivery:  C/S Breastfeeding Experience:  Breastfed first baby for 6 months Maternal Medical Conditions:  none Maternal Medications:  PNV's  ________________________________________________________________________  Breastfeeding History (Post Discharge)  Frequency of breastfeeding:  2-3 times per day Duration of feeding:  A few minutes  Supplementation     Breastmilk:  Volume 90 mls Frequency:  Every2- 3 hours (occasionally needs formula) Method:  Bottle,   Pumping  Type of pump:  Symphony Frequency:  6-8 times/24 hours Volume:  90-121ml  Infant Intake and Output Assessment  Voids: qs in 24 hrs.  Color:  Clear yellow Stools:  qs in 24 hrs.  Color:  Yellow  ________________________________________________________________________  Maternal Breast Assessment  Breast:  Soft Nipple:  Erect Pain level:  0 Pain interventions:  Bra  _______________________________________________________________________ Feeding Assessment/Evaluation  Mom and 40 week old infant here for follow up feeding assessment.  Mom is mainly pumping and bottle feeding.  Baby  has gained 11.1 ounces/7 days.  Baby placed at breast skin to skin in football hold.  A few attempts made to latch baby without success.  20 mm nipple shield applied and baby easily latched but sleepy and not interested in feeding.  Some off and on suckling occurred during a 10 minute period but not nutritive enough to gain any knowledge from a pre and post weight.  Encouraged mom to try and allow baby to practice more at breast and if some progress is made call for follow up feeding assessment.  Praised mom for her pumping efforts and great weight gain.  Mom knows that due to [facility] Syndrome baby's ability at breast may be challenged.  Baby's muscle tone is good.  Parents are very engaged with baby.  Instructed to protect milk supply and continue pumping 8 times/24 hours.  Initial feeding assessment:  Infant's oral assessment:  WNL  Positioning:  Football Left breast  LATCH documentation:  Latch:  1 = Repeated attempts needed to sustain latch, nipple held in mouth throughout feeding, stimulation needed to elicit sucking reflex.  Audible swallowing:  1 = A few with stimulation  Type of nipple:  2 = Everted at rest and after stimulation  Comfort (Breast/Nipple):  2 = Soft / non-tender  Hold (Positioning):  2 = No assistance needed to correctly position infant at breast  LATCH score:  9  Attached assessment:  Shallow  Lips flanged:  No.  Lips untucked:  No.  Suck assessment:  Nonnutritive  Tools:  Nipple shield 20 mm Instructed on use and cleaning of tool:  Yes.

## 2014-03-10 ENCOUNTER — Encounter: Payer: Self-pay | Admitting: *Deleted

## 2014-03-11 ENCOUNTER — Encounter: Payer: Self-pay | Admitting: Obstetrics & Gynecology

## 2014-03-12 ENCOUNTER — Telehealth: Payer: Self-pay | Admitting: *Deleted

## 2014-03-12 NOTE — Telephone Encounter (Signed)
Pt placed call to office regarding taking Omeprazole and breastfeeding.   Return call to pt. Pt made aware that she can take this medication while breastfeeding.

## 2014-03-18 ENCOUNTER — Telehealth: Payer: Self-pay | Admitting: *Deleted

## 2014-03-18 NOTE — Telephone Encounter (Signed)
Patient is calling with first cycle since delivery. It is heavier and earlier than expected. 4:35 Patient states she has been bleeding for 7 days and she is 1 1/2 weeks into her Micronor. Patient states she normally does not bleed this long. Advised per Dr Jodi Mourning- sometimes the first cycle after delivery can be heavy. Patient to continue on and call by Friday if the bleeding has not decreased. Patient voiced understanding and will do that. Patient has an appointment on Monday.

## 2014-03-20 NOTE — Progress Notes (Unsigned)
Not seen by physician.

## 2014-03-24 ENCOUNTER — Ambulatory Visit (INDEPENDENT_AMBULATORY_CARE_PROVIDER_SITE_OTHER): Payer: Medicaid Other | Admitting: Obstetrics

## 2014-03-24 ENCOUNTER — Encounter: Payer: Self-pay | Admitting: Obstetrics

## 2014-03-24 DIAGNOSIS — I10 Essential (primary) hypertension: Secondary | ICD-10-CM

## 2014-03-24 MED ORDER — HYDROCHLOROTHIAZIDE 25 MG PO TABS: 25.0000 mg | ORAL_TABLET | Freq: Every day | ORAL | Status: DC

## 2014-03-24 NOTE — Progress Notes (Signed)
Subjective:     Chelsea Haley is a 39 y.o. female who presents for a postpartum visit. She is 6 weeks postpartum following a low cervical transverse Cesarean section. I have fully reviewed the prenatal and intrapartum course. The delivery was at 49 gestational weeks. Outcome: repeat cesarean section, low transverse incision. Anesthesia: spinal. Postpartum course has been normal. Baby's course has been normal. Baby is feeding by breast. Bleeding no bleeding. Bowel function is normal. Bladder function is normal. Patient is not sexually active. Contraception method is oral progesterone-only contraceptive. Postpartum depression screening: negative.  Tobacco, alcohol and substance abuse history reviewed.  Adult immunizations reviewed including TDAP, rubella and varicella.  The following portions of the patient's history were reviewed and updated as appropriate: allergies, current medications, past family history, past medical history, past social history, past surgical history and problem list.  Review of Systems A comprehensive review of systems was negative.   Objective:    BP 143/100 mmHg  Pulse 76  Wt 198 lb (89.812 kg)  General:  alert and no distress   Breasts:  inspection negative, no nipple discharge or bleeding, no masses or nodularity palpable  Lungs: clear to auscultation bilaterally  Heart:  regular rate and rhythm, S1, S2 normal, no murmur, click, rub or gallop  Abdomen: normal findings: soft, non-tender and incision C, D, I.   Vulva:  normal  Vagina: normal vagina  Cervix:  no cervical motion tenderness  Corpus: normal size, contour, position, consistency, mobility, non-tender  Adnexa:  no mass, fullness, tenderness  Rectal Exam: Not performed.           Assessment:     Normal postpartum exam. Pap smear not done at today's visit.   Hypertension, benign Contraceptive counseling  Plan:    1. Contraception: oral progesterone-only contraceptive 2. Continue PNV's 3. Follow up  in: 6 weeks or as needed.   4.  HCTZ Rx 5.  Continue Micronor   Healthy lifestyle prnactices reviewed

## 2014-05-05 ENCOUNTER — Ambulatory Visit (INDEPENDENT_AMBULATORY_CARE_PROVIDER_SITE_OTHER): Payer: Medicaid Other | Admitting: Obstetrics

## 2014-05-05 VITALS — BP 137/84 | HR 92 | Wt 205.0 lb

## 2014-05-05 DIAGNOSIS — B379 Candidiasis, unspecified: Secondary | ICD-10-CM

## 2014-05-05 DIAGNOSIS — L0292 Furuncle, unspecified: Secondary | ICD-10-CM

## 2014-05-06 ENCOUNTER — Encounter: Payer: Self-pay | Admitting: Obstetrics

## 2014-05-06 MED ORDER — CLINDAMYCIN PHOSPHATE 1 % EX LOTN: TOPICAL_LOTION | Freq: Two times a day (BID) | CUTANEOUS | Status: DC

## 2014-05-06 MED ORDER — FLUCONAZOLE 150 MG PO TABS: 150.0000 mg | ORAL_TABLET | Freq: Once | ORAL | Status: DC

## 2014-05-06 NOTE — Progress Notes (Signed)
Patient ID: Chelsea Haley, female   DOB: 1975-11-17, 39 y.o.   MRN: [id number]  Chief Complaint  Patient presents with  . Follow-up    HPI Chelsea Haley is a 39 y.o. female.  Presents with complaint of a boil on inner upper thigh.  Has a history of boils in axillary region and on legs.  HPI  Past Medical History  Diagnosis Date  . History of chicken pox   . Depression   . Migraines   . History of kidney stones   . History of seizure disorder     as a child. No medication since age 31.  Marland Kitchen Hyperlipidemia   . Hypertension   . Tachycardia   . History of frequent urinary tract infections   . GERD (gastroesophageal reflux disease)   . Binge eating   . Endometriosis   . Allergy     Past Surgical History  Procedure Laterality Date  . Cholecystectomy  2006  . Cesarean section  2002  . Pilonidal cyst excision  2003  . Laporoscopy    . Wisdom tooth extraction Bilateral   . Cesarean section N/A 02/07/2014    Procedure: CESAREAN SECTION;  Surgeon: Shelly Bombard, MD;  Location: [facility];  Service: Obstetrics;  Laterality: N/A;    Family History  Problem Relation Age of Onset  . Arthritis Father   . Hypertension Father   . Heart disease Maternal Grandmother     ICD  . Cancer Maternal Grandfather     lung  . Diabetes Neg Hx     Social History History  Substance Use Topics  . Smoking status: Former Smoker -- 0.03 packs/day for 20 years    Types: Cigarettes    Quit date: 10/19/2011  . Smokeless tobacco: Never Used     Comment: 4 cigarettes daily  . Alcohol Use: No    Allergies  Allergen Reactions  . Adhesive [Tape]     Skin sensitivity  . Banana     Migraine, if they are cooked or over ripe.   . Morphine And Related Itching    hallucinations  . Other Itching    WALNUTS.  Gums itch. Cashews.   . Pertussis Vaccines     seizures  . Tramadol Anxiety    Current Outpatient Prescriptions  Medication Sig Dispense Refill  . norethindrone (MICRONOR,CAMILA,ERRIN) 0.35  MG tablet Take 1 tablet (0.35 mg total) by mouth daily. 1 Package 11  . clindamycin (CLEOCIN-T) 1 % lotion Apply topically 2 (two) times daily. 60 mL 5  . fluconazole (DIFLUCAN) 150 MG tablet Take 1 tablet (150 mg total) by mouth once. 1 tablet 2  . hydrochlorothiazide (HYDRODIURIL) 25 MG tablet Take 1 tablet (25 mg total) by mouth daily. (Patient not taking: Reported on 05/05/2014) 30 tablet 11  . HYDROcodone-acetaminophen (NORCO/VICODIN) 5-325 MG per tablet Take 1-2 tablets by mouth every 4 (four) hours as needed for moderate pain or severe pain. (Patient not taking: Reported on 03/24/2014) 40 tablet 0  . ibuprofen (ADVIL,MOTRIN) 600 MG tablet Take 1 tablet (600 mg total) by mouth every 6 (six) hours as needed for mild pain. (Patient not taking: Reported on 03/24/2014) 40 tablet 5  . omeprazole (PRILOSEC) 20 MG capsule Take 1 capsule (20 mg total) by mouth 2 (two) times daily before a meal. (Patient not taking: Reported on 05/05/2014) 60 capsule 5  . prenatal vitamin w/FE, FA (NATACHEW) 29-1 MG CHEW chewable tablet Chew 1 tablet by mouth daily at 12 noon. (Patient not  taking: Reported on 05/05/2014) 90 tablet 3   No current facility-administered medications for this visit.    Review of Systems Review of Systems Constitutional: negative for fatigue and weight loss Respiratory: negative for cough and wheezing Cardiovascular: negative for chest pain, fatigue and palpitations Gastrointestinal: negative for abdominal pain and change in bowel habits Genitourinary:negative Integument/breast: negative for nipple discharge Musculoskeletal:negative for myalgias Neurological: negative for gait problems and tremors Behavioral/Psych: negative for abusive relationship, depression Endocrine: negative for temperature intolerance     Blood pressure 137/84, pulse 92, weight 205 lb (92.987 kg), currently breastfeeding.  Physical Exam Physical Exam                   Pelvis:  External genitalia: normal  general appearance Urinary system: urethral meatus normal and bladder without fullness, nontender Vaginal: normal without tenderness, induration or masses Cervix: normal appearance Adnexa: normal bimanual exam Uterus: anteverted and non-tender, normal size                             Extremities:  Boil on left inner thigh:   Data Reviewed Labs  Assessment     Boil     Plan    Cleocin - T cream Rx F/U prn  Orders Placed This Encounter  Procedures  . SureSwab Bacterial Vaginosis/itis   Meds ordered this encounter  Medications  . clindamycin (CLEOCIN-T) 1 % lotion    Sig: Apply topically 2 (two) times daily.    Dispense:  60 mL    Refill:  5  . fluconazole (DIFLUCAN) 150 MG tablet    Sig: Take 1 tablet (150 mg total) by mouth once.    Dispense:  1 tablet    Refill:  2

## 2014-05-07 ENCOUNTER — Ambulatory Visit: Payer: Medicaid Other | Admitting: Obstetrics

## 2014-05-08 LAB — SURESWAB BACTERIAL VAGINOSIS/ITIS
Atopobium vaginae: NOT DETECTED Log (cells/mL)
C. PARAPSILOSIS, DNA: NOT DETECTED
C. TROPICALIS, DNA: NOT DETECTED
C. albicans, DNA: NOT DETECTED
C. glabrata, DNA: NOT DETECTED
GARDNERELLA VAGINALIS: NOT DETECTED Log (cells/mL)
LACTOBACILLUS SPECIES: NOT DETECTED Log (cells/mL)
MEGASPHAERA SPECIES: NOT DETECTED Log (cells/mL)
T. VAGINALIS RNA, QL TMA: NOT DETECTED

## 2014-06-02 ENCOUNTER — Ambulatory Visit
Admission: RE | Admit: 2014-06-02 | Discharge: 2014-06-02 | Disposition: A | Discharge status: Home / Self Care | Payer: 59 | Source: Ambulatory Visit | Attending: Family Medicine | Admitting: Family Medicine

## 2014-06-02 ENCOUNTER — Other Ambulatory Visit: Payer: Self-pay | Admitting: Family Medicine

## 2014-06-02 DIAGNOSIS — M25511 Pain in right shoulder: Secondary | ICD-10-CM

## 2014-06-16 ENCOUNTER — Ambulatory Visit: Payer: 59 | Attending: Family Medicine

## 2014-06-16 DIAGNOSIS — R6889 Other general symptoms and signs: Secondary | ICD-10-CM | POA: Diagnosis not present

## 2014-06-16 DIAGNOSIS — R293 Abnormal posture: Secondary | ICD-10-CM | POA: Diagnosis not present

## 2014-06-16 DIAGNOSIS — M25512 Pain in left shoulder: Secondary | ICD-10-CM | POA: Diagnosis present

## 2014-06-16 NOTE — Therapy (Signed)
[facility] [address] [place], [place], [address] Phone: [phone]   Fax:  [phone]  Physical Therapy Evaluation  Patient Details  Name: Chelsea Haley MRN: [id number] Date of Birth: 17-Jul-1975 Referring Provider:  Gavin Pound, MD  Encounter Date: 06/16/2014      PT End of Session - 06/16/14 1655    Visit Number 1   Date for PT Re-Evaluation 08/11/14   PT Start Time 7654   PT Stop Time 1658   PT Time Calculation (min) 44 min   Activity Tolerance Patient tolerated treatment well   Behavior During Therapy [facility] for tasks assessed/performed      Past Medical History  Diagnosis Date  . History of chicken pox   . Depression   . Migraines   . History of kidney stones   . History of seizure disorder     as a child. No medication since age 34.  Marland Kitchen Hyperlipidemia   . Hypertension   . Tachycardia   . History of frequent urinary tract infections   . GERD (gastroesophageal reflux disease)   . Binge eating   . Endometriosis   . Allergy     Past Surgical History  Procedure Laterality Date  . Cholecystectomy  2006  . Cesarean section  2002  . Pilonidal cyst excision  2003  . Laporoscopy    . Wisdom tooth extraction Bilateral   . Cesarean section N/A 02/07/2014    Procedure: CESAREAN SECTION;  Surgeon: Shelly Bombard, MD;  Location: [facility];  Service: Obstetrics;  Laterality: N/A;    There were no vitals filed for this visit.  Visit Diagnosis:  Left shoulder pain - Plan: PT plan of care cert/re-cert  Posture abnormality - Plan: PT plan of care cert/re-cert  Activity intolerance - Plan: PT plan of care cert/re-cert      Subjective Assessment - 06/16/14 1617    Subjective Pt is a 39 y.o. female who presents to PT with complaints of Lt shoulder/scapular pain that began 05/13/14.  Pt has a 1 month old child and shoulder pain began from lifting and caring for her child.    Pertinent History Pt had a baby 4  months ago   Diagnostic tests x-ray: no acute abnormality with minimal OA change of the AC joint.     Currently in Pain? Yes   Pain Score 5   5-8/10 range over the past week   Pain Location Shoulder  scapula   Pain Orientation Left   Pain Descriptors / Indicators Tightness;Burning   Pain Type Acute pain   Pain Onset More than a month ago   Pain Frequency Constant   Aggravating Factors  at night, lifting/carrying objects or baby, reaching overhead especially at night   Pain Relieving Factors pt has not determined anything that helps the pain   Effect of Pain on Daily Activities difficulty lifiting her child, sleep disturbance   Multiple Pain Sites No            [facility] PT Assessment - 06/16/14 0001    Assessment   Medical Diagnosis Lt shoulder pain with scapular muscle strain and rotator cuff tendonitis   Onset Date 05/13/14   Precautions   Precautions Other (comment)  pt needs to be looking at you to receive information    Restrictions   Weight Bearing Restrictions No   Balance Screen   Has the patient fallen in the past 6 months No   Has the patient had  a decrease in activity level because of a fear of falling?  No   Is the patient reluctant to leave their home because of a fear of falling?  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  Computer use and sitting.   Cognition   Overall Cognitive Status Within Functional Limits for tasks assessed   Observation/Other Assessments   Focus on Therapeutic Outcomes (FOTO)  44% limitation   Posture/Postural Control   Posture/Postural Control Postural limitations   Postural Limitations Rounded Shoulders;Forward head   ROM / Strength   AROM / PROM / Strength AROM;PROM;Strength   AROM   Overall AROM  Within  functional limits for tasks performed   Overall AROM Comments Lt and Rt shoulder AROM are equal.  Pt reports medial Lt scapular border pain with all Lt shoulder motions.    AROM Assessment Site Shoulder   Right/Left Shoulder Left;Right   PROM   Overall PROM  Within functional limits for tasks performed   Overall PROM Comments Lt shoulder PROM is full with medial Lt scapular border pain with all PROM   PROM Assessment Site Shoulder   Strength   Overall Strength Within functional limits for tasks performed   Overall Strength Comments Rt=Lt strength with 5/5 strength.  Pt reports medial Lt scapular pain with resisted testing.     Strength Assessment Site Shoulder   Palpation   Palpation Pt with palpable tenderness over Lt medial scapular border with increased muscle tension.                            PT Education - 06/16/14 1648    Education provided Yes   Education Details HEP:  seated thoracic rotation, table slides on the Lt, posture eduction   Person(s) Educated Patient   Methods Explanation;Handout;Demonstration   Comprehension Verbalized understanding;Returned demonstration          PT Short Term Goals - 06/16/14 1700    PT SHORT TERM GOAL #1   Title be independent in initial HEP   Time 4   Period Weeks   Status New   PT SHORT TERM GOAL #2   Title report a 25% reduction in Lt shoulder pain with sleep at night   Time 4   Period Weeks   Status New   PT SHORT TERM GOAL #3   Title lift and carry baby with Lt UE with 25% less pain   Time 4   Period Weeks   Status New           PT Long Term Goals - 06/16/14 1611    PT LONG TERM GOAL #1   Title be independent in advanced HEP   Time 8   Period Weeks   Status New   PT LONG TERM GOAL #2   Title reduce FOTO to < or = to 32% limitation   Time 8   Period Weeks   Status New   PT LONG TERM GOAL #3   Title report 60% reduction in Lt shoulder pain with sleep at night   Time 8   Period Weeks    Status New   PT LONG TERM GOAL #4   Title lift and carry baby with Lt UE without limitation  Time 8   Period Weeks   Status New   PT LONG TERM GOAL #5   Title reach overhead with Lt UE with < or = to 3/10 pain   Time 8   Period Weeks   Status New               Plan - 06/16/14 1656    Clinical Impression Statement Pt with Lt shoulder/scapular pain and postural dysfunction.  Pt with palpable tenderness at medial scapular border and has difficulty using her arm to lift her child     Pt will benefit from skilled therapeutic intervention in order to improve on the following deficits Decreased endurance;Postural dysfunction;Pain;Decreased activity tolerance   Rehab Potential Good   PT Frequency 1x / week   PT Duration 8 weeks   PT Treatment/Interventions ADLs/Self Care Home Management;Therapeutic activities;Patient/family education;Therapeutic exercise;Ultrasound;Manual techniques;Passive range of motion;Cryotherapy;Neuromuscular re-education;Electrical Stimulation   PT Next Visit Plan [place], manual to Lt scapula and thoracic mobs. Gentle Lt shoulder AROM, scapular/postural strength   Consulted and Agree with Plan of Care Patient         Problem List Patient Active Problem List   Diagnosis Date Noted  . Breech presentation 02/07/2014  . [redacted] weeks gestation of pregnancy   . Elderly multigravida with antepartum condition or complication   . Trisomy 21, fetal, affecting care of mother, antepartum   . Polyhydramnios in third trimester 01/16/2014  . Palpitations 09/11/2013  . 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 Nodal paroxysmal tachycardia 08/18/2013  . Previous cesarean delivery affecting pregnancy 08/18/2013  . Unspecified high-risk pregnancy 08/14/2013  . Polycystic ovarian syndrome 01/14/2013  . 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 Pedal edema 11/14/2012  . Allergic rhinitis 11/14/2012  . Family planning 07/17/2012  .  Benign essential HTN 07/17/2012  . H/O female genital system disorder 07/17/2012  . Hearing loss 05/16/2012  . Liver hemangioma 02/28/2012  . Obesity 09/20/2011  . Depression with anxiety 09/20/2011  . PTSD (post-traumatic stress disorder) 09/20/2011  . Seizure disorder 09/20/2011  . Hyperlipidemia 09/20/2011  . Migraines 09/20/2011  . Frequent UTI 09/20/2011    Kana Reimann, PT 06/16/2014, 5:05 PM  [facility] [address] [place], [place], [address] Phone: [phone]   Fax:  [phone]

## 2014-06-16 NOTE — Patient Instructions (Signed)
Cervico-Thoracic: Extension / Rotation (Sitting)   Reach across body with left arm and grasp back of chair. Gently look over right side shoulder. Hold _20___ seconds. Relax. Repeat __3__ times per set. Do _3___ sets per session. Do _3___ sessions per day.  http://orth.exer.us/981   Copyright  VHI. All rights reserved.   Sitting upright, slide forearm forward along table, bending from the waist until a stretch is felt.   Posture - Standing   Good posture is important. Avoid slouching and forward head thrust. Maintain curve in low back and align ears over shoulders, hips over ankles.  Pull your belly button in toward your back bone. Posture Tips DO: - stand tall and erect - keep chin tucked in - keep head and shoulders in alignment - check posture regularly in mirror or large window - pull head back against headrest in car seat;  Change your position often.  Sit with lumbar support. DON'T: - slouch or slump while watching TV or reading - sit, stand or lie in one position  for too long;  Sitting is especially hard on the spine so if you sit at a desk/use the computer, then stand up often! Copyright  VHI. All rights reserved.  Posture - Sitting  Sit upright, head facing forward. Try using a roll to support lower back. Keep shoulders relaxed, and avoid rounded back. Keep hips level with knees. Avoid crossing legs for long periods. Copyright  VHI. All rights reserved.  Chronic neck strain can develop because of poor posture and faulty work habits  Postural strain related to slumped sitting and forward head posture is a leading cause of headaches, neck and upper back pain  General strengthening and flexibility exercises are helpful in the treatment of neck pain.  Most importantly, you should learn to correct the posture that may be contributing to chronic pain.   Change positions frequently  Change your work or home environment to improve posture and mechanics.

## 2014-06-25 ENCOUNTER — Encounter: Payer: Self-pay | Admitting: Physical Therapy

## 2014-06-25 ENCOUNTER — Ambulatory Visit: Payer: 59 | Admitting: Physical Therapy

## 2014-06-25 DIAGNOSIS — M25512 Pain in left shoulder: Secondary | ICD-10-CM

## 2014-06-25 DIAGNOSIS — R293 Abnormal posture: Secondary | ICD-10-CM

## 2014-06-25 NOTE — Therapy (Signed)
[facility] [address], [place], [place], [address] Phone: [phone]   Fax:  [phone]  Physical Therapy Treatment  Patient Details  Name: Chelsea Haley MRN: [id number] Date of Birth: 16-May-1975 Referring Provider:  Gavin Pound, MD  Encounter Date: 06/25/2014      PT End of Session - 06/25/14 1605    Visit Number 2   Date for PT Re-Evaluation 08/11/14   PT Start Time 4970   PT Stop Time 1610   PT Time Calculation (min) 35 min   Activity Tolerance Patient tolerated treatment well   Behavior During Therapy [facility] for tasks assessed/performed      Past Medical History  Diagnosis Date  . History of chicken pox   . Depression   . Migraines   . History of kidney stones   . History of seizure disorder     as a child. No medication since age 33.  Marland Kitchen Hyperlipidemia   . Hypertension   . Tachycardia   . History of frequent urinary tract infections   . GERD (gastroesophageal reflux disease)   . Binge eating   . Endometriosis   . Allergy     Past Surgical History  Procedure Laterality Date  . Cholecystectomy  2006  . Cesarean section  2002  . Pilonidal cyst excision  2003  . Laporoscopy    . Wisdom tooth extraction Bilateral   . Cesarean section N/A 02/07/2014    Procedure: CESAREAN SECTION;  Surgeon: Shelly Bombard, MD;  Location: [facility];  Service: Obstetrics;  Laterality: N/A;    There were no vitals filed for this visit.  Visit Diagnosis:  Left shoulder pain  Posture abnormality      Subjective Assessment - 06/25/14 1541    Subjective Doing not so bad today.   Currently in Pain? Yes   Pain Score 3    Pain Location Scapula   Pain Orientation Left   Pain Descriptors / Indicators Sore   Pain Type Acute pain   Aggravating Factors  at night, activities with baby   Pain Relieving Factors Not doing things with baby   Multiple Pain Sites No                       [facility] Adult PT  Treatment/Exercise - 06/25/14 0001    Shoulder Exercises: Seated   Other Seated Exercises Scap squeezes 5x   Shoulder Exercises: Standing   Extension Strengthening;Both;10 reps   Theraband Level (Shoulder Extension) Level 1 (Yellow)   Row Strengthening;Both;10 reps   Theraband Level (Shoulder Row) Level 1 (Yellow)   Shoulder Exercises: Pulleys   Flexion 2 minutes   ABduction 2 minutes   Ultrasound   Ultrasound Location Lt scapula   Ultrasound Parameters 1.3wt/cm2    Ultrasound Goals Pain                PT Education - 06/25/14 1604    Education provided Yes   Education Details HEP, review of posture principles,    Person(s) Educated Patient   Methods Explanation;Demonstration;Handout   Comprehension Verbalized understanding;Returned demonstration          PT Short Term Goals - 06/25/14 1612    PT SHORT TERM GOAL #1   Title be independent in initial HEP   Time 4   Period Weeks   Status On-going   PT SHORT TERM GOAL #2   Title report a 25% reduction in Lt shoulder pain with sleep  at night   Time 4   Period Weeks   Status On-going   PT SHORT TERM GOAL #3   Title lift and carry baby with Lt UE with 25% less pain   Time 4   Period Weeks   Status On-going           PT Long Term Goals - 06/16/14 1611    PT LONG TERM GOAL #1   Title be independent in advanced HEP   Time 8   Period Weeks   Status New   PT LONG TERM GOAL #2   Title reduce FOTO to < or = to 32% limitation   Time 8   Period Weeks   Status New   PT LONG TERM GOAL #3   Title report 60% reduction in Lt shoulder pain with sleep at night   Time 8   Period Weeks   Status New   PT LONG TERM GOAL #4   Title lift and carry baby with Lt UE without limitation   Time 8   Period Weeks   Status New   PT LONG TERM GOAL #5   Title reach overhead with Lt UE with < or = to 3/10 pain   Time 8   Period Weeks   Status New               Plan - 06/25/14 1606    Clinical Impression Statement  Pt just with soreness. Trial of [place] today. A lot of focus on postural corrections at shouldes.   Pt will benefit from skilled therapeutic intervention in order to improve on the following deficits Decreased endurance;Postural dysfunction;Pain;Decreased activity tolerance   Rehab Potential Good   PT Frequency 1x / week   PT Duration 8 weeks   PT Treatment/Interventions ADLs/Self Care Home Management;Therapeutic activities;Patient/family education;Therapeutic exercise;Ultrasound;Manual techniques;Passive range of motion;Cryotherapy;Neuromuscular re-education;Electrical Stimulation   PT Next Visit Plan Review all HEP, see what she thought of [place]   Consulted and Agree with Plan of Care Patient        Problem List Patient Active Problem List   Diagnosis Date Noted  . Breech presentation 02/07/2014  . [redacted] weeks gestation of pregnancy   . Elderly multigravida with antepartum condition or complication   . Trisomy 21, fetal, affecting care of mother, antepartum   . Polyhydramnios in third trimester 01/16/2014  . Palpitations 09/11/2013  . 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 Nodal paroxysmal tachycardia 08/18/2013  . Previous cesarean delivery affecting pregnancy 08/18/2013  . Unspecified high-risk pregnancy 08/14/2013  . Polycystic ovarian syndrome 01/14/2013  . 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 Pedal edema 11/14/2012  . Allergic rhinitis 11/14/2012  . Family planning 07/17/2012  . Benign essential HTN 07/17/2012  . H/O female genital system disorder 07/17/2012  . Hearing loss 05/16/2012  . Liver hemangioma 02/28/2012  . Obesity 09/20/2011  . Depression with anxiety 09/20/2011  . PTSD (post-traumatic stress disorder) 09/20/2011  . Seizure disorder 09/20/2011  . Hyperlipidemia 09/20/2011  . Migraines 09/20/2011  . Frequent UTI 09/20/2011    Hydeia Mcatee, PTA 06/25/2014, 4:14 PM  [facility] [address] [place], [place], [address] Phone: [phone]   Fax:  [phone]

## 2014-06-25 NOTE — Patient Instructions (Signed)
Posture Tips DO: - stand tall and erect - keep chin tucked in - keep head and shoulders in alignment - check posture regularly in mirror or large window - pull head back against headrest in car seat;  Change your position often.  Sit with lumbar support. DON'T: - slouch or slump while watching TV or reading - sit, stand or lie in one position  for too long;  Sitting is especially hard on the spine so if you sit at a desk/use the computer, then stand up often!   Copyright  VHI. All rights reserved.  Posture - Standing   Good posture is important. Avoid slouching and forward head thrust. Maintain curve in low back and align ears over shoul- ders, hips over ankles.  Pull your belly button in toward your back bone.   Copyright  VHI. All rights reserved.  Posture - Sitting   Sit upright, head facing forward. Try using a roll to support lower back. Keep shoulders relaxed, and avoid rounded back. Keep hips level with knees. Avoid crossing legs for long periods.   Copyright  VHI. All rights reserved.  EXTENSION: Standing - Resistance Band: Stable (Active)   Stand, right arm at side. Against yellow resistance band, draw arm backward, as far as possible, keeping elbow straight. Complete __1_ sets of __10_ repetitions. Perform 1-2___ sessions per day.  Copyright  VHI. All rights reserved.    With yellow band anchored at chest level, pull elbows backward, squeezing shoulder blades together. Keep head and spine neutral. Row ___ times, ___ times per day.  http://ss.exer.us/290   Copyright  VHI. All rights reserved.  Resistive Band Rowing   With resistive band anchored in door, grasp both ends. Keeping elbows bent, pull back, squeezing shoulder blades together. Hold __2__ seconds. Repeat __10__ times. Do ___2_ sessions per day.  http://gt2.exer.us/97   Copyright  VHI. All rights reserved.

## 2014-07-01 ENCOUNTER — Encounter: Payer: Self-pay | Admitting: Physical Therapy

## 2014-07-01 ENCOUNTER — Ambulatory Visit: Payer: 59 | Admitting: Physical Therapy

## 2014-07-01 DIAGNOSIS — R6889 Other general symptoms and signs: Secondary | ICD-10-CM

## 2014-07-01 DIAGNOSIS — M25512 Pain in left shoulder: Secondary | ICD-10-CM | POA: Diagnosis not present

## 2014-07-01 DIAGNOSIS — R293 Abnormal posture: Secondary | ICD-10-CM

## 2014-07-01 NOTE — Therapy (Signed)
[facility] [address] [place], [place], [address] Phone: [phone]   Fax:  [phone]  Physical Therapy Treatment  Patient Details  Name: Chelsea Haley MRN: [id number] Date of Birth: [birth year] Referring Provider:  Gavin Pound, MD  Encounter Date: 07/01/2014      PT End of Session - 07/01/14 1631    Visit Number 3   Date for PT Re-Evaluation 08/11/14   PT Start Time 1618   PT Stop Time 5400   PT Time Calculation (min) 40 min   Activity Tolerance Patient tolerated treatment well   Behavior During Therapy [facility] for tasks assessed/performed      Past Medical History  Diagnosis Date  . History of chicken pox   . Depression   . Migraines   . History of kidney stones   . History of seizure disorder     as a child. No medication since age 71.  Marland Kitchen Hyperlipidemia   . Hypertension   . Tachycardia   . History of frequent urinary tract infections   . GERD (gastroesophageal reflux disease)   . Binge eating   . Endometriosis   . Allergy     Past Surgical History  Procedure Laterality Date  . Cholecystectomy  2006  . Cesarean section  2002  . Pilonidal cyst excision  2003  . Laporoscopy    . Wisdom tooth extraction Bilateral   . Cesarean section N/A 02/07/2014    Procedure: CESAREAN SECTION;  Surgeon: Shelly Bombard, MD;  Location: [facility];  Service: Obstetrics;  Laterality: N/A;    There were no vitals filed for this visit.  Visit Diagnosis:  Left shoulder pain  Posture abnormality  Activity intolerance      Subjective Assessment - 07/01/14 1624    Subjective Feeling better this week than last week with left shoulder, but end range of motion is painful and lifting and carring the baby    Pertinent History Pt had a baby 4 months ago   Currently in Pain? Yes   Pain Score 2    Pain Location Scapula   Pain Orientation Left   Pain Descriptors / Indicators Sore   Pain Type Acute pain   Pain Onset  More than a month ago   Pain Frequency Constant   Multiple Pain Sites No                         [facility] Adult PT Treatment/Exercise - 07/01/14 0001    Exercises   Exercises Neck   Neck Exercises: Seated   Lateral Flexion 5 reps;Right  lat bending to Rt side for UT stretch    Shoulder Exercises: Seated   Other Seated Exercises Scap squeezes 3x 5   pt advised to practice during her day   Shoulder Exercises: Standing   Extension Strengthening;Both;10 reps   Theraband Level (Shoulder Extension) Level 1 (Yellow)   Row Strengthening;Both;10 reps   Theraband Level (Shoulder Row) Level 1 (Yellow)   Other Standing Exercises Lt UE into flexion at wall, Rt LE into extension 2x10   Shoulder Exercises: Pulleys   Flexion 3 minutes   ABduction 3 minutes   Ultrasound   Ultrasound Location Lt med scapular border   Ultrasound Parameters 1.2W/cm2, 100%, x 56min   Ultrasound Goals Pain                  PT Short Term Goals - 07/01/14 1637    PT  SHORT TERM GOAL #1   Title be independent in initial HEP   Time 4   Period Weeks   Status On-going   PT SHORT TERM GOAL #2   Title report a 25% reduction in Lt shoulder pain with sleep at night   Time 4   Period Weeks   Status On-going   PT SHORT TERM GOAL #3   Title lift and carry baby with Lt UE with 25% less pain   Time 4   Status On-going           PT Long Term Goals - 07/01/14 1638    PT LONG TERM GOAL #1   Title be independent in advanced HEP   Time 8   Period Weeks   Status On-going   PT LONG TERM GOAL #2   Title reduce FOTO to < or = to 32% limitation   Time 8   Period Weeks   Status On-going   PT LONG TERM GOAL #3   Title report 60% reduction in Lt shoulder pain with sleep at night   Time 8   Period Weeks   Status On-going   PT LONG TERM GOAL #4   Title lift and carry baby with Lt UE without limitation   Time 8   Period Weeks   Status On-going   PT LONG TERM GOAL #5   Title reach overhead  with Lt UE with < or = to 3/10 pain   Time 8   Period Weeks   Status On-going               Plan - 07/01/14 1632    Clinical Impression Statement Pt with discomfort at end range in all four planes, and decreased strength   Pt will benefit from skilled therapeutic intervention in order to improve on the following deficits Decreased endurance;Postural dysfunction;Pain;Decreased activity tolerance   Rehab Potential Good   PT Frequency 1x / week   PT Duration 8 weeks   PT Treatment/Interventions ADLs/Self Care Home Management;Therapeutic activities;Patient/family education;Therapeutic exercise;Ultrasound;Manual techniques;Passive range of motion;Cryotherapy;Neuromuscular re-education;Electrical Stimulation   PT Next Visit Plan Continue with [place] since that seemed to help per pt, continue stretching and strength   PT Home Exercise Plan continue stretching and strength for Lt shoulder   Consulted and Agree with Plan of Care Patient        Problem List Patient Active Problem List   Diagnosis Date Noted  . Breech presentation 02/07/2014  . [redacted] weeks gestation of pregnancy   . Elderly multigravida with antepartum condition or complication   . Trisomy 21, fetal, affecting care of mother, antepartum   . Polyhydramnios in third trimester 01/16/2014  . Palpitations 09/11/2013  . 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 Nodal paroxysmal tachycardia 08/18/2013  . Previous cesarean delivery affecting pregnancy 08/18/2013  . Unspecified high-risk pregnancy 08/14/2013  . Polycystic ovarian syndrome 01/14/2013  . 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 Pedal edema 11/14/2012  . Allergic rhinitis 11/14/2012  . Family planning 07/17/2012  . Benign essential HTN 07/17/2012  . H/O female genital system disorder 07/17/2012  . Hearing loss 05/16/2012  . Liver hemangioma 02/28/2012  . Obesity 09/20/2011  . Depression with anxiety 09/20/2011  . PTSD  (post-traumatic stress disorder) 09/20/2011  . Seizure disorder 09/20/2011  . Hyperlipidemia 09/20/2011  . Migraines 09/20/2011  . Frequent UTI 09/20/2011    NAUMANN-HOUEGNIFIO,Jyles Sontag PTA 07/01/2014, 5:02 PM  [facility] [address]  [place], [place], [address] Phone: [phone]   Fax:  [phone]

## 2014-07-07 ENCOUNTER — Ambulatory Visit: Payer: 59

## 2014-07-07 DIAGNOSIS — M25512 Pain in left shoulder: Secondary | ICD-10-CM | POA: Diagnosis not present

## 2014-07-07 DIAGNOSIS — R6889 Other general symptoms and signs: Secondary | ICD-10-CM

## 2014-07-07 DIAGNOSIS — R293 Abnormal posture: Secondary | ICD-10-CM

## 2014-07-07 NOTE — Therapy (Signed)
[facility] [address], [place], [place], [address] Phone: [phone]   Fax:  [phone]  Physical Therapy Treatment  Patient Details  Name: Chelsea Haley MRN: [id number] Date of Birth: [birth year] Referring Provider:  Gavin Pound, MD  Encounter Date: 07/07/2014      PT End of Session - 07/07/14 1658    Visit Number 4   Date for PT Re-Evaluation 08/11/14   PT Start Time 1619   PT Stop Time 6415   PT Time Calculation (min) 39 min   Activity Tolerance Patient tolerated treatment well      Past Medical History  Diagnosis Date  . History of chicken pox   . Depression   . Migraines   . History of kidney stones   . History of seizure disorder     as a child. No medication since age 66.  Marland Kitchen Hyperlipidemia   . Hypertension   . Tachycardia   . History of frequent urinary tract infections   . GERD (gastroesophageal reflux disease)   . Binge eating   . Endometriosis   . Allergy     Past Surgical History  Procedure Laterality Date  . Cholecystectomy  2006  . Cesarean section  2002  . Pilonidal cyst excision  2003  . Laporoscopy    . Wisdom tooth extraction Bilateral   . Cesarean section N/A 02/07/2014    Procedure: CESAREAN SECTION;  Surgeon: Shelly Bombard, MD;  Location: [facility];  Service: Obstetrics;  Laterality: N/A;    There were no vitals filed for this visit.  Visit Diagnosis:  Left shoulder pain  Posture abnormality  Activity intolerance      Subjective Assessment - 07/07/14 1621    Subjective Lt shoulder is feeling a lot better.  3/10 has been max pain this week.     Currently in Pain? 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  Activities with baby   Pain  Relieving Factors Not carrying the baby, stretching                         [facility] Adult PT Treatment/Exercise - 07/07/14 0001    Neck Exercises: Supine   Upper Extremity D1 Flexion;20 reps;Theraband   Theraband Level (UE D1) Level 1 (Yellow)   Other Supine Exercise supine on foam roll: horizonal abduction 2x10   Shoulder Exercises: Supine   Other Supine Exercises supine on foam roll x 5 minutes   Shoulder Exercises: Pulleys   Flexion 3 minutes  PT present to discuss progress   ABduction 3 minutes   Shoulder Exercises: ROM/Strengthening   UBE (Upper Arm Bike) seated Level 1x 6 minutes (3/3)   Ultrasound   Ultrasound Location Lt medial scapular border   Ultrasound Parameters 1.2 w/cm2 continuous x 8 minutes   Ultrasound Goals Pain                  PT Short Term Goals - 07/07/14 1623    PT SHORT TERM GOAL #1   Title be independent in initial HEP   Status Achieved   PT SHORT TERM GOAL #2   Title report a 25% reduction in  Lt shoulder pain with sleep at night   Status Achieved   PT SHORT TERM GOAL #3   Title lift and carry baby with Lt UE with 25% less pain   Status On-going  Pt is not carrying baby unless she has to           PT Long Term Goals - 07/07/14 1624    PT LONG TERM GOAL #1   Title be independent in advanced HEP   Time 8   Period Weeks   Status On-going  met for current HEP   PT LONG TERM GOAL #3   Title report 60% reduction in Lt shoulder pain with sleep at night   Status Achieved  Pt reports that she can sleep without pain   PT LONG TERM GOAL #4   Title lift and carry baby with Lt UE without limitation   Time 8   Period Weeks   Status On-going  Pt is getting help as able so lifting sometimes   PT LONG TERM GOAL #5   Title reach overhead with Lt UE with < or = to 3/10 pain   Status On-going  repetition with this increases the pain to > 3/10               Plan - 07/07/14 1637    Clinical Impression Statement Pt  reports that her Lt shoulder is 50% better with PT.  See goals for assessment.  Pt with continued tension in the Lt rhomboids and painful and limited use with lifting.     Pt will benefit from skilled therapeutic intervention in order to improve on the following deficits Decreased endurance;Postural dysfunction;Pain;Decreased activity tolerance   PT Frequency 1x / week   PT Duration 8 weeks   PT Treatment/Interventions ADLs/Self Care Home Management;Therapeutic activities;Patient/family education;Therapeutic exercise;Ultrasound;Manual techniques;Passive range of motion;Cryotherapy;Neuromuscular re-education;Electrical Stimulation   PT Next Visit Plan Continue with [place], continue stretching and strength   Consulted and Agree with Plan of Care Patient        Problem List Patient Active Problem List   Diagnosis Date Noted  . Breech presentation 02/07/2014  . [redacted] weeks gestation of pregnancy   . Elderly multigravida with antepartum condition or complication   . Trisomy 21, fetal, affecting care of mother, antepartum   . Polyhydramnios in third trimester 01/16/2014  . Palpitations 09/11/2013  . 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 Nodal paroxysmal tachycardia 08/18/2013  . Previous cesarean delivery affecting pregnancy 08/18/2013  . Unspecified high-risk pregnancy 08/14/2013  . Polycystic ovarian syndrome 01/14/2013  . 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 Pedal edema 11/14/2012  . Allergic rhinitis 11/14/2012  . Family planning 07/17/2012  . Benign essential HTN 07/17/2012  . H/O female genital system disorder 07/17/2012  . Hearing loss 05/16/2012  . Liver hemangioma 02/28/2012  . Obesity 09/20/2011  . Depression with anxiety 09/20/2011  . PTSD (post-traumatic stress disorder) 09/20/2011  . Seizure disorder 09/20/2011  . Hyperlipidemia 09/20/2011  . Migraines 09/20/2011  . Frequent UTI 09/20/2011    TAKACS,KELLY, PT 07/07/2014, 5:00  PM  [facility] [address] [place], [place], [address] Phone: [phone]   Fax:  [phone]

## 2014-07-17 ENCOUNTER — Encounter: Payer: Self-pay | Admitting: Physical Therapy

## 2014-07-17 ENCOUNTER — Ambulatory Visit: Payer: 59 | Attending: Family Medicine | Admitting: Physical Therapy

## 2014-07-17 DIAGNOSIS — M25512 Pain in left shoulder: Secondary | ICD-10-CM

## 2014-07-17 DIAGNOSIS — R6889 Other general symptoms and signs: Secondary | ICD-10-CM | POA: Diagnosis not present

## 2014-07-17 DIAGNOSIS — R293 Abnormal posture: Secondary | ICD-10-CM

## 2014-07-17 NOTE — Therapy (Signed)
[facility] [address], [place], [place], [address] Phone: [phone]   Fax:  [phone]  Physical Therapy Treatment  Patient Details  Name: Chelsea Haley MRN: [id number] Date of Birth: [birth year] Referring Provider:  Gavin Pound, MD  Encounter Date: 07/17/2014      PT End of Session - 07/17/14 1623    Visit Number 5   Date for PT Re-Evaluation 08/11/14   PT Start Time 1615   PT Stop Time 1700   PT Time Calculation (min) 45 min   Activity Tolerance Patient tolerated treatment well   Behavior During Therapy [facility] for tasks assessed/performed      Past Medical History  Diagnosis Date  . History of chicken pox   . Depression   . Migraines   . History of kidney stones   . History of seizure disorder     as a child. No medication since age 37.  Marland Kitchen Hyperlipidemia   . Hypertension   . Tachycardia   . History of frequent urinary tract infections   . GERD (gastroesophageal reflux disease)   . Binge eating   . Endometriosis   . Allergy     Past Surgical History  Procedure Laterality Date  . Cholecystectomy  2006  . Cesarean section  2002  . Pilonidal cyst excision  2003  . Laporoscopy    . Wisdom tooth extraction Bilateral   . Cesarean section N/A 02/07/2014    Procedure: CESAREAN SECTION;  Surgeon: Shelly Bombard, MD;  Location: [facility];  Service: Obstetrics;  Laterality: N/A;    There were no vitals filed for this visit.  Visit Diagnosis:  Left shoulder pain  Posture abnormality  Activity intolerance                       [facility] Adult PT Treatment/Exercise - 07/17/14 0001    Neck Exercises: Supine   Upper Extremity D1 Flexion;20 reps;Theraband   Theraband Level (UE D1) Level 2 (Red)   Other Supine Exercise supine on foam roll: horizonal abduction 2x10  red band   Shoulder Exercises: Pulleys   Flexion 3 minutes  PT present to discuss progress   ABduction 3 minutes   Shoulder Exercises: ROM/Strengthening   UBE (Upper Arm Bike) seated Level 1x 6 minutes (3/3)   Ultrasound   Ultrasound Location left medial scapular border   Ultrasound Parameters 1.2 w/cm2 ; 100% 8 min   Ultrasound Goals Pain   Manual Therapy   Manual Therapy Massage   Massage to left interscapular area                PT Education - 07/17/14 1659    Education provided No          PT Short Term Goals - 07/17/14 1624    PT SHORT TERM GOAL #3   Title lift and carry baby with Lt UE with 25% less pain   Time 4   Period Weeks   Status Achieved           PT Long Term Goals - 07/17/14 1624    PT LONG TERM GOAL #1   Title be independent in advanced HEP   Time 8   Period Weeks   Status On-going  still learning exercises   PT LONG TERM GOAL #2   Title reduce FOTO to < or = to 32% limitation   Time 8   Period Weeks   Status On-going  PT LONG TERM GOAL #3   Title report 60% reduction in Lt shoulder pain with sleep at night   Time 8   Period Weeks   Status Achieved   PT LONG TERM GOAL #4   Title lift and carry baby with Lt UE without limitation   Time 8   Period Weeks   Status On-going  still has difficulty   PT LONG TERM GOAL #5   Title reach overhead with Lt UE with < or = to 3/10 pain   Time 8   Period Weeks   Status On-going  most time 1/10 but today is 4/10               Plan - 07/17/14 1659    Clinical Impression Statement Patient has a muscle spasm in the left shoulder blade and was tight today 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  Patient has decreased left shoulder mobility due to the muscle spasm.    Pt will benefit from skilled therapeutic intervention in order to improve on the following deficits Decreased endurance;Postural dysfunction;Pain;Decreased activity tolerance   Rehab Potential Good   PT Frequency 1x / week   PT Duration 8 weeks   PT Treatment/Interventions ADLs/Self Care Home Management;Therapeutic  activities;Patient/family education;Therapeutic exercise;Ultrasound;Manual techniques;Passive range of motion;Cryotherapy;Neuromuscular re-education;Electrical Stimulation   PT Next Visit Plan Continue with [place], continue stretching and strength   PT Home Exercise Plan continue stretching and strength for Lt shoulder   Consulted and Agree with Plan of Care Patient        Problem List Patient Active Problem List   Diagnosis Date Noted  . Breech presentation 02/07/2014  . [redacted] weeks gestation of pregnancy   . Elderly multigravida with antepartum condition or complication   . Trisomy 21, fetal, affecting care of mother, antepartum   . Polyhydramnios in third trimester 01/16/2014  . Palpitations 09/11/2013  . 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 Nodal paroxysmal tachycardia 08/18/2013  . Previous cesarean delivery affecting pregnancy 08/18/2013  . Unspecified high-risk pregnancy 08/14/2013  . Polycystic ovarian syndrome 01/14/2013  . 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 Pedal edema 11/14/2012  . Allergic rhinitis 11/14/2012  . Family planning 07/17/2012  . Benign essential HTN 07/17/2012  . H/O female genital system disorder 07/17/2012  . Hearing loss 05/16/2012  . Liver hemangioma 02/28/2012  . Obesity 09/20/2011  . Depression with anxiety 09/20/2011  . PTSD (post-traumatic stress disorder) 09/20/2011  . Seizure disorder 09/20/2011  . Hyperlipidemia 09/20/2011  . Migraines 09/20/2011  . Frequent UTI 09/20/2011    Cullen Vanallen,PT 07/17/2014, 5:02 PM  [facility] [address], [place], [place], [address] Phone: [phone]   Fax:  [phone]

## 2014-07-23 ENCOUNTER — Encounter: Payer: Self-pay | Admitting: Physical Therapy

## 2014-07-23 ENCOUNTER — Ambulatory Visit: Payer: 59 | Admitting: Physical Therapy

## 2014-07-23 DIAGNOSIS — R6889 Other general symptoms and signs: Secondary | ICD-10-CM

## 2014-07-23 DIAGNOSIS — R293 Abnormal posture: Secondary | ICD-10-CM

## 2014-07-23 DIAGNOSIS — M25512 Pain in left shoulder: Secondary | ICD-10-CM | POA: Diagnosis not present

## 2014-07-23 NOTE — Therapy (Signed)
[facility] [address] [place], [place], [address] Phone: [phone]   Fax:  [phone]  Physical Therapy Treatment  Patient Details  Name: Chelsea Haley MRN: [id number] Date of Birth: Jun 05, 1975 Referring Provider:  Gavin Pound, MD  Encounter Date: 07/23/2014      PT End of Session - 07/23/14 0859    Visit Number 6   Date for PT Re-Evaluation 08/11/14   PT Start Time 0844   PT Stop Time 0930   PT Time Calculation (min) 46 min   Activity Tolerance Patient tolerated treatment well   Behavior During Therapy [facility] for tasks assessed/performed      Past Medical History  Diagnosis Date  . History of chicken pox   . Depression   . Migraines   . History of kidney stones   . History of seizure disorder     as a child. No medication since age 66.  Marland Kitchen Hyperlipidemia   . Hypertension   . Tachycardia   . History of frequent urinary tract infections   . GERD (gastroesophageal reflux disease)   . Binge eating   . Endometriosis   . Allergy     Past Surgical History  Procedure Laterality Date  . Cholecystectomy  2006  . Cesarean section  2002  . Pilonidal cyst excision  2003  . Laporoscopy    . Wisdom tooth extraction Bilateral   . Cesarean section N/A 02/07/2014    Procedure: CESAREAN SECTION;  Surgeon: Shelly Bombard, MD;  Location: [facility];  Service: Obstetrics;  Laterality: N/A;    There were no vitals filed for this visit.  Visit Diagnosis:  Posture abnormality  Activity intolerance      Subjective Assessment - 07/23/14 0852    Subjective Lt shoulder is feeling a lot better.  2/10 has been max pain this last week, the last few days no pain.   Diagnostic tests x-ray: no acute abnormality with minimal OA change of the AC joint.     Currently in Pain? No/denies   Multiple Pain Sites No                         [facility] Adult PT Treatment/Exercise - 07/23/14 0001    Neck Exercises:  Supine   Upper Extremity D1 Flexion;20 reps;Theraband   Theraband Level (UE D1) Level 2 (Red)   Other Supine Exercise supine on foam roll: horizonal abduction 2x10   Shoulder Exercises: Seated   Other Seated Exercises Pectoralis push 35#x10, 30#x10 with horizontal and vertical grip   Shoulder Exercises: ROM/Strengthening   UBE (Upper Arm Bike) L2 29min (3/3)  PTA present to discuss progress    Cybex Row 25 reps  triceps strength with 45#   Pushups 20 reps;Other (comment)  in halfplank 2x10,    Other ROM/Strengthening Exercises Triceps dips 1x10, 1 x 4 unable to perform more   Other ROM/Strengthening Exercises 5# bil in supine into 90degrees of flexion                  PT Short Term Goals - 07/17/14 1624    PT SHORT TERM GOAL #3   Title lift and carry baby with Lt UE with 25% less pain   Time 4   Period Weeks   Status Achieved           PT Long Term Goals - 07/23/14 0910    PT LONG TERM GOAL #1   Title  be independent in advanced HEP   Time 8   Period Weeks   Status On-going   PT LONG TERM GOAL #2   Title reduce FOTO to < or = to 32% limitation  As of 07/23/14 Pt scored 31%    Time 8   Period Weeks   Status Achieved   PT LONG TERM GOAL #3   Title report 60% reduction in Lt shoulder pain with sleep at night   Time 8   Period Weeks   Status Achieved   PT LONG TERM GOAL #4   Title lift and carry baby with Lt UE without limitation   Time 8   Period Weeks   Status Achieved   PT LONG TERM GOAL #5   Title reach overhead with Lt UE with < or = to 3/10 pain   Time 8   Period Weeks   Status Achieved               Plan - 07/23/14 0859    Clinical Impression Statement Pt able to perform all activities without pain, pt will continue to benefit from strengthening and endurance   Pt will benefit from skilled therapeutic intervention in order to improve on the following deficits Decreased endurance;Postural dysfunction;Pain;Decreased activity tolerance    Rehab Potential Good   PT Frequency 1x / week   PT Duration 8 weeks   PT Treatment/Interventions ADLs/Self Care Home Management;Therapeutic activities;Patient/family education;Therapeutic exercise;Ultrasound;Manual techniques;Passive range of motion;Cryotherapy;Neuromuscular re-education;Electrical Stimulation   PT Next Visit Plan D/C if still without pain, continue to work on strength and endurance,   PT Home Exercise Plan continue stretching and strength for Lt shoulder   Consulted and Agree with Plan of Care Patient        Problem List Patient Active Problem List   Diagnosis Date Noted  . Breech presentation 02/07/2014  . [redacted] weeks gestation of pregnancy   . Elderly multigravida with antepartum condition or complication   . Trisomy 21, fetal, affecting care of mother, antepartum   . Polyhydramnios in third trimester 01/16/2014  . Palpitations 09/11/2013  . 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 Nodal paroxysmal tachycardia 08/18/2013  . Previous cesarean delivery affecting pregnancy 08/18/2013  . Unspecified high-risk pregnancy 08/14/2013  . Polycystic ovarian syndrome 01/14/2013  . 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 Pedal edema 11/14/2012  . Allergic rhinitis 11/14/2012  . Family planning 07/17/2012  . Benign essential HTN 07/17/2012  . H/O female genital system disorder 07/17/2012  . Hearing loss 05/16/2012  . Liver hemangioma 02/28/2012  . Obesity 09/20/2011  . Depression with anxiety 09/20/2011  . PTSD (post-traumatic stress disorder) 09/20/2011  . Seizure disorder 09/20/2011  . Hyperlipidemia 09/20/2011  . Migraines 09/20/2011  . Frequent UTI 09/20/2011    NAUMANN-HOUEGNIFIO,Tymesha Ditmore PTA 07/23/2014, 9:31 AM  [facility] [address] [place], [place], [address] Phone: [phone]   Fax:  [phone]

## 2014-07-26 IMAGING — US US TRANSVAGINAL NON-OB
1 series · 13 of 25 positions shown · non-contrast
Comparison: None

CLINICAL DATA: Irregular menses.

EXAM:
TRANSABDOMINAL AND TRANSVAGINAL ULTRASOUND OF PELVIS
TECHNIQUE: Both transabdominal and transvaginal ultrasound examinations of the
pelvis were performed. Transabdominal technique was performed for
global imaging of the pelvis including uterus, ovaries, adnexal
regions, and pelvic cul-de-sac. It was necessary to proceed with
endovaginal exam following the transabdominal exam to visualize the
endometrium and ovaries.

[Series 1: us pelvis complete · 13 of 45 slices shown]
[im 1/45]
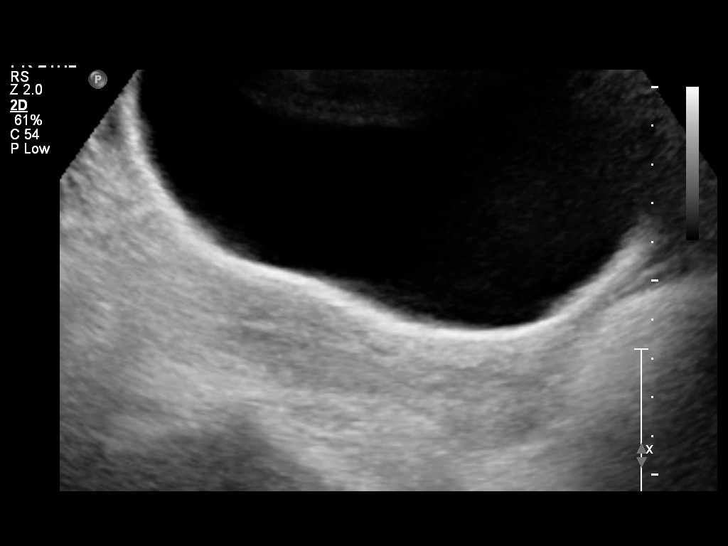
[im 4/45]
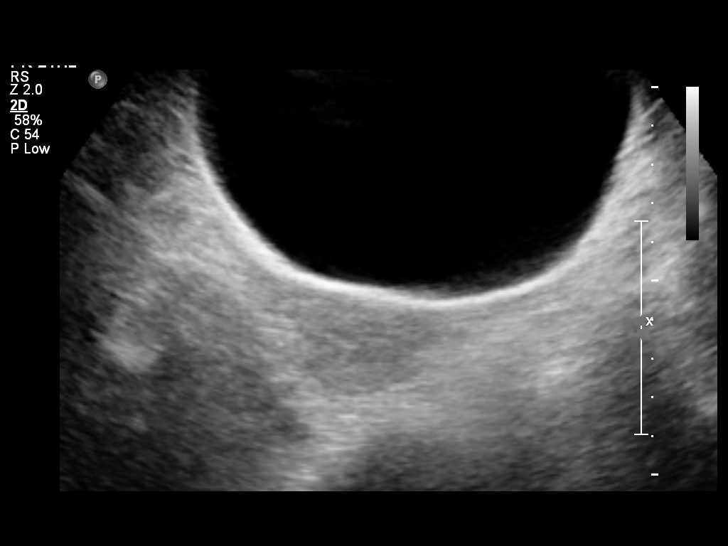
[im 8/45]
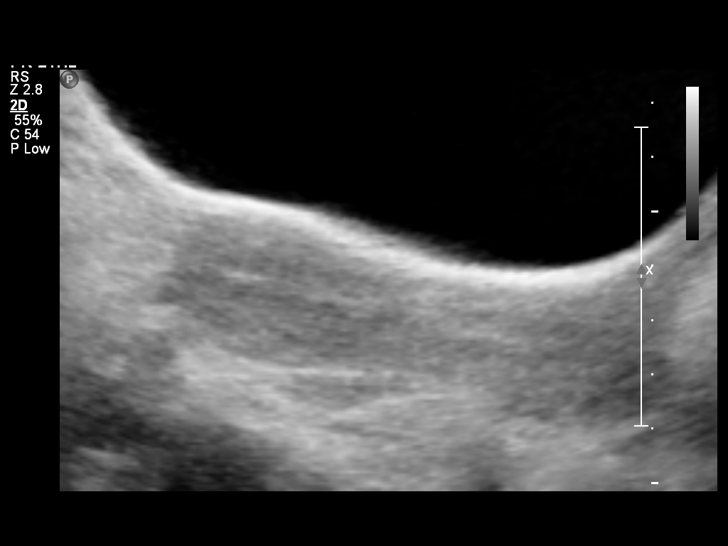
[im 12/45]
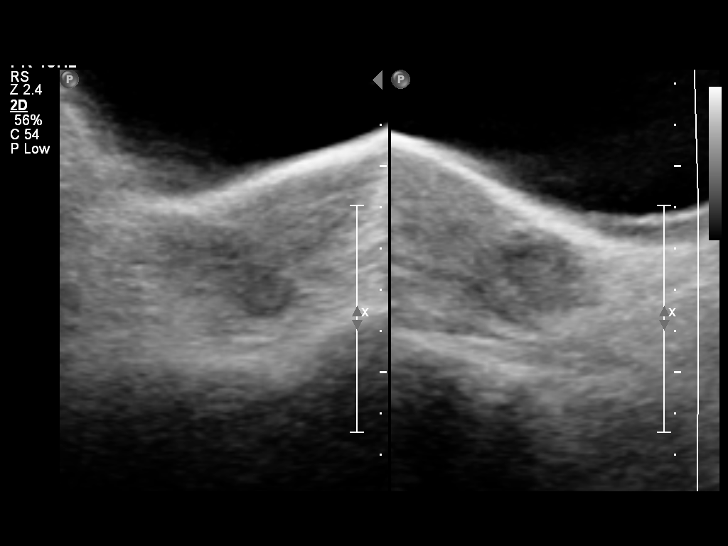
[im 15/45]
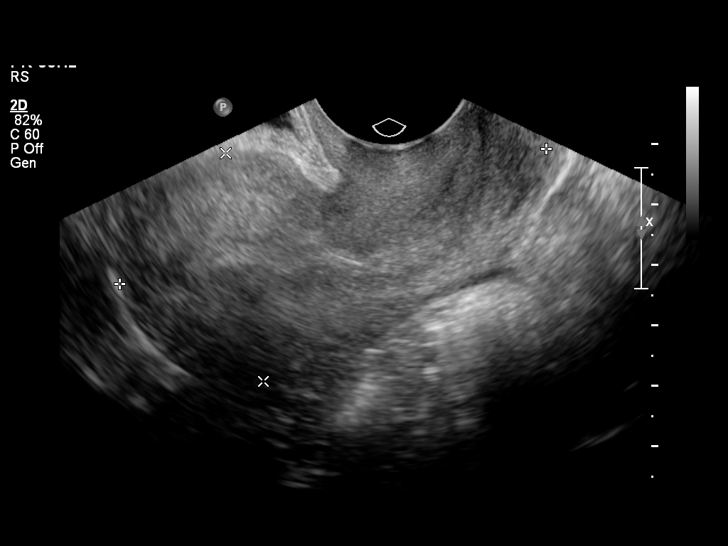
[im 19/45]
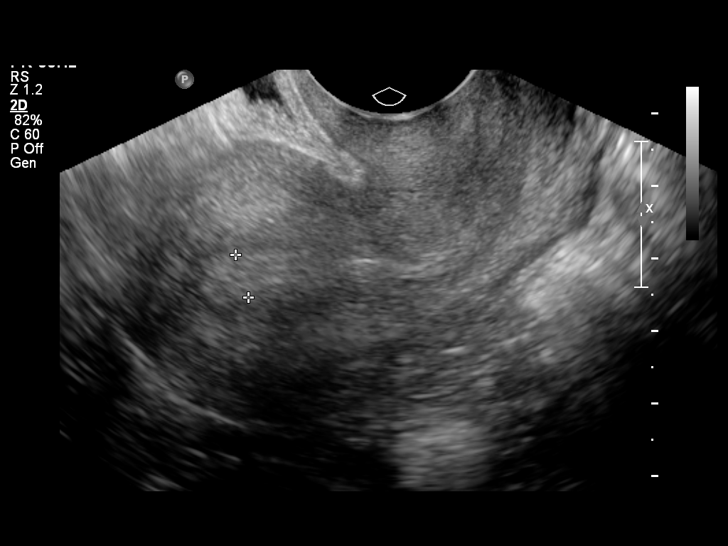
[im 23/45]
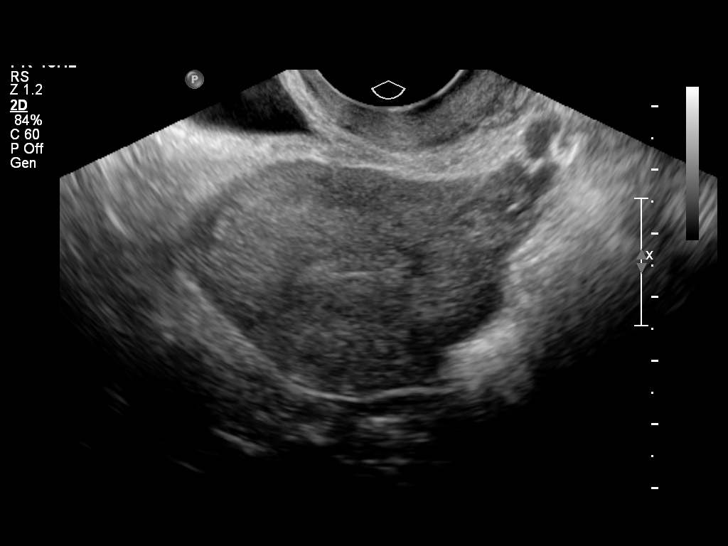
[im 26/45]
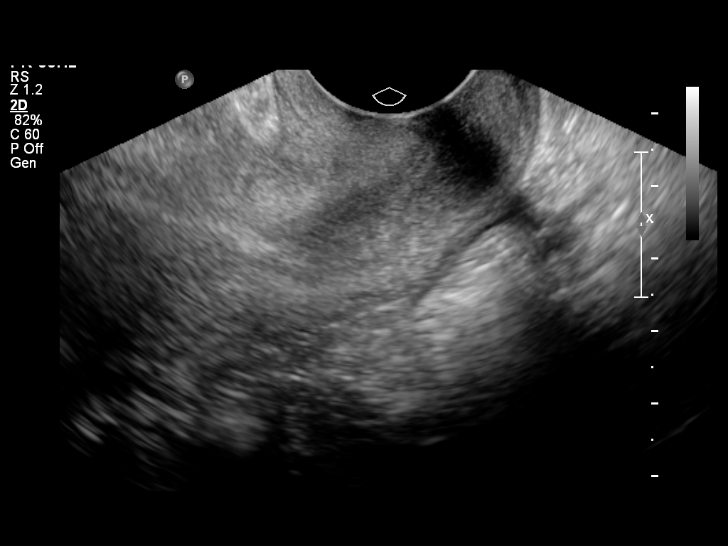
[im 30/45]
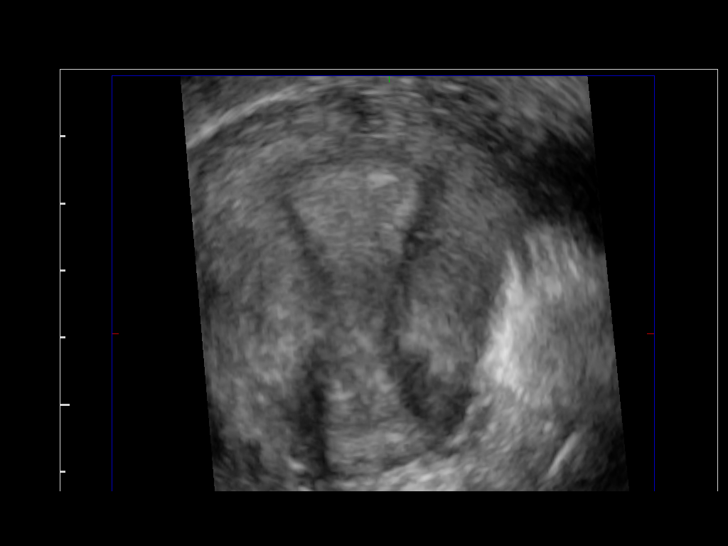
[im 34/45]
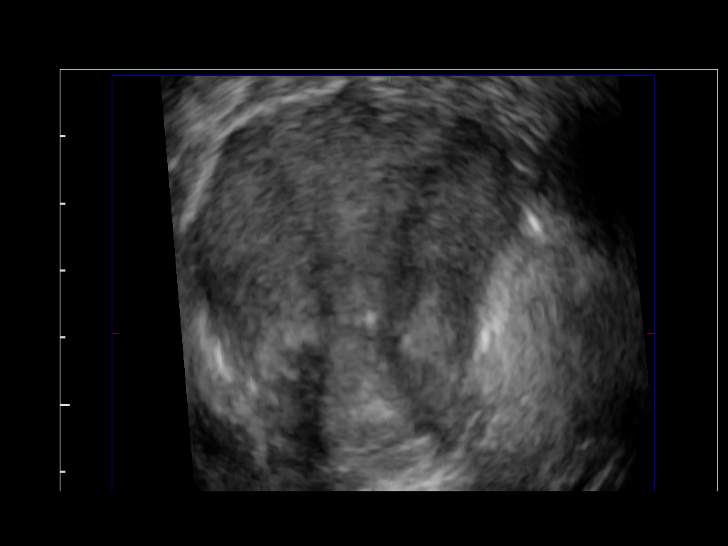
[im 37/45]
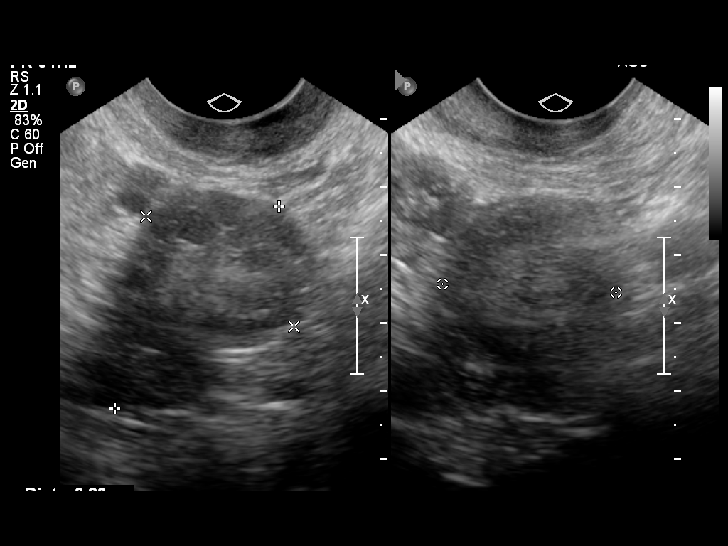
[im 41/45]
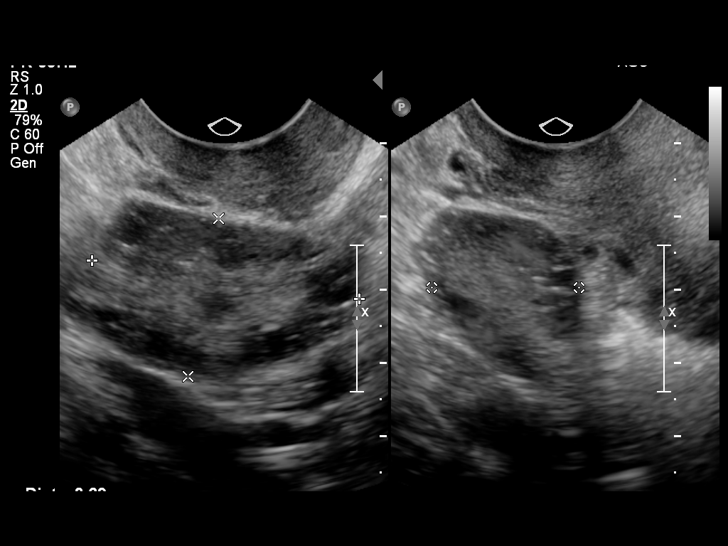
[im 45/45]
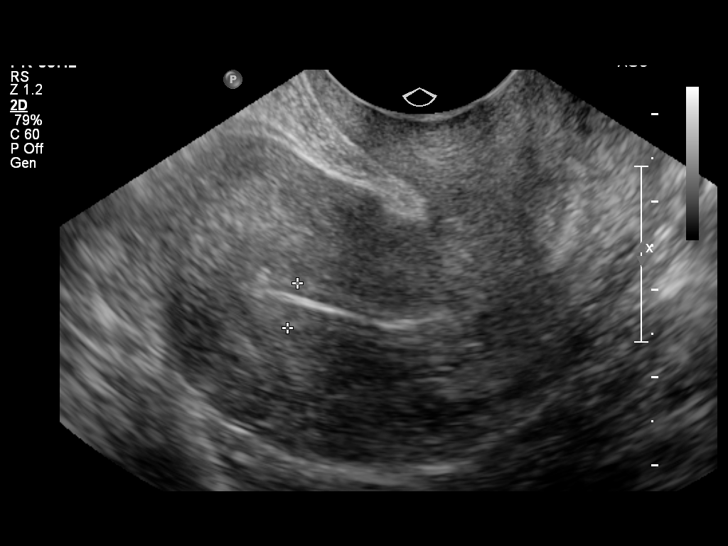

[13 of 25 positions shown; findings below may reference images not displayed]

FINDINGS: Uterus

Measurements: 6.9 x 3.8 x 5.0 cm. No fibroids or other mass
visualized.

Endometrium

Thickness: 5.5 mm.  No focal abnormality visualized.

Right ovary

Measurements: 3.7 x 2.2 x 2.0 cm. Normal appearance/no adnexal mass.

Left ovary

Measurements: 3.8 x 2.7 x 2.6 cm. Normal appearance/no adnexal mass.

Other findings

No free fluid.
IMPRESSION: Grossly unremarkable appearance to the endometrium. If bleeding
remains unresponsive to hormonal or medical therapy, sonohysterogram
should be considered for focal lesion work-up. (Ref: Radiological
Reasoning: Algorithmic Workup of Abnormal Vaginal Bleeding with
Endovaginal Sonography and Sonohysterography. AJR [id number]; 191:[id number])

## 2014-07-30 ENCOUNTER — Ambulatory Visit: Payer: 59 | Admitting: Physical Therapy

## 2014-07-30 ENCOUNTER — Encounter: Payer: Self-pay | Admitting: Physical Therapy

## 2014-07-30 DIAGNOSIS — M25512 Pain in left shoulder: Secondary | ICD-10-CM

## 2014-07-30 DIAGNOSIS — R293 Abnormal posture: Secondary | ICD-10-CM

## 2014-07-30 DIAGNOSIS — R6889 Other general symptoms and signs: Secondary | ICD-10-CM

## 2014-07-30 NOTE — Therapy (Signed)
[facility] [address], [place], [place], [address] Phone: [phone]   Fax:  [phone]  Physical Therapy Treatment  Patient Details  Name: Chelsea Haley MRN: [id number] Date of Birth: Sep 23, 1975 Referring Provider:  Gavin Pound, MD  Encounter Date: 07/30/2014      PT End of Session - 07/30/14 1127    Visit Number 7   Date for PT Re-Evaluation 08/11/14   PT Start Time 1105   PT Stop Time 1130   PT Time Calculation (min) 25 min   Activity Tolerance Patient tolerated treatment well   Behavior During Therapy [facility] for tasks assessed/performed      Past Medical History  Diagnosis Date  . History of chicken pox   . Depression   . Migraines   . History of kidney stones   . History of seizure disorder     as a child. No medication since age 38.  Marland Kitchen Hyperlipidemia   . Hypertension   . Tachycardia   . History of frequent urinary tract infections   . GERD (gastroesophageal reflux disease)   . Binge eating   . Endometriosis   . Allergy     Past Surgical History  Procedure Laterality Date  . Cholecystectomy  2006  . Cesarean section  2002  . Pilonidal cyst excision  2003  . Laporoscopy    . Wisdom tooth extraction Bilateral   . Cesarean section N/A 02/07/2014    Procedure: CESAREAN SECTION;  Surgeon: Shelly Bombard, MD;  Location: [facility];  Service: Obstetrics;  Laterality: N/A;    There were no vitals filed for this visit.  Visit Diagnosis:  Posture abnormality  Activity intolerance  Left shoulder pain      Subjective Assessment - 07/30/14 1107    Subjective I'm doing really well. Pt may not come next week she reports. Almost no pain this week.    Currently in Pain? No/denies   Multiple Pain Sites No            [facility] PT Assessment - 07/30/14 0001    Observation/Other Assessments   Focus on Therapeutic Outcomes (FOTO)  26% limited                      [facility] Adult PT  Treatment/Exercise - 07/30/14 0001    Shoulder Exercises: Seated   Horizontal ABduction Strengthening;Both;20 reps;Theraband   Theraband Level (Shoulder Horizontal ABduction) Level 2 (Red)   Shoulder Exercises: Pulleys   Flexion 3 minutes   ABduction 3 minutes   Shoulder Exercises: ROM/Strengthening   UBE (Upper Arm Bike) L2 31mn (3/3)  PTA present to discuss progress    Pushups 20 reps;Other (comment)  in halfplank 2x10,                 PT Education - 07/30/14 1126    Education provided Yes   Education Details Review of HEP    Person(s) Educated Patient   Methods Demonstration   Comprehension Returned demonstration          PT Short Term Goals - 07/17/14 1624    PT SHORT TERM GOAL #3   Title lift and carry baby with Lt UE with 25% less pain   Time 4   Period Weeks   Status Achieved           PT Long Term Goals - 07/30/14 1127    PT LONG TERM GOAL #1   Title be independent in advanced  HEP   Time 8   Period Weeks   Status Achieved   PT LONG TERM GOAL #2   Title reduce FOTO to < or = to 32% limitation   Time 8   Period Weeks   Status Achieved   PT LONG TERM GOAL #3   Title report 60% reduction in Lt shoulder pain with sleep at night   Time 8   Period Weeks   Status Achieved   PT LONG TERM GOAL #4   Title lift and carry baby with Lt UE without limitation   Time 8   Period Weeks   Status Achieved               Plan - 07/30/14 1129    Clinical Impression Statement Patient decided during tx to have today be her last day. All goal s met.   Pt will benefit from skilled therapeutic intervention in order to improve on the following deficits Decreased endurance;Postural dysfunction;Pain;Decreased activity tolerance   Rehab Potential Good   PT Frequency 1x / week   PT Duration 8 weeks   PT Treatment/Interventions ADLs/Self Care Home Management;Therapeutic activities;Patient/family education;Therapeutic exercise;Ultrasound;Manual  techniques;Passive range of motion;Cryotherapy;Neuromuscular re-education;Electrical Stimulation   PT Next Visit Plan DC   Consulted and Agree with Plan of Care Patient        Problem List Patient Active Problem List   Diagnosis Date Noted  . Breech presentation 02/07/2014  . [redacted] weeks gestation of pregnancy   . Elderly multigravida with antepartum condition or complication   . Trisomy 21, fetal, affecting care of mother, antepartum   . Polyhydramnios in third trimester 01/16/2014  . Palpitations 09/11/2013  . 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 Nodal paroxysmal tachycardia 08/18/2013  . Previous cesarean delivery affecting pregnancy 08/18/2013  . Unspecified high-risk pregnancy 08/14/2013  . Polycystic ovarian syndrome 01/14/2013  . 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 Pedal edema 11/14/2012  . Allergic rhinitis 11/14/2012  . Family planning 07/17/2012  . Benign essential HTN 07/17/2012  . H/O female genital system disorder 07/17/2012  . Hearing loss 05/16/2012  . Liver hemangioma 02/28/2012  . Obesity 09/20/2011  . Depression with anxiety 09/20/2011  . PTSD (post-traumatic stress disorder) 09/20/2011  . Seizure disorder 09/20/2011  . Hyperlipidemia 09/20/2011  . Migraines 09/20/2011  . Frequent UTI 09/20/2011      Myrene Galas, PTA 07/30/2014 11:31 AM PHYSICAL THERAPY DISCHARGE SUMMARY  Visits from Start of Care: 7  Current functional level related to goals / functional outcomes: See above for goal status   Remaining deficits: Pt reports shoulder pain at times but not limiting at this time.     Education / Equipment: HEP Plan: Patient agrees to discharge.  Patient goals were not met. Patient is being discharged due to meeting the stated rehab goals.  ?????     [facility] [address] [place], [place], [address] Phone: [phone]   Fax:   [phone]

## 2014-08-04 ENCOUNTER — Encounter: Payer: 59 | Admitting: Physical Therapy

## 2014-10-31 NOTE — Progress Notes (Signed)
HPI: FU palpitations and chest pain. Patient has a history of inappropriate sinus tachycardia which is reasonably well controlled with Toprol. Echocardiogram in July 2013 showed normal LV function and no significant valvular disease. Exercise treadmill in August of 2013 showed no chest pain and no ST depression but she did not achieve target heart rate. Since last seen, she has some dyspnea on exertion but no orthopnea, PND or pedal edema. No exertional chest pain. Her palpitations have worsened recently. They continue to be described as a skip. There is associated chest tightness and dyspnea but no syncope.  Current Outpatient Prescriptions  Medication Sig Dispense Refill  . norethindrone (MICRONOR,CAMILA,ERRIN) 0.35 MG tablet Take 1 tablet (0.35 mg total) by mouth daily. 1 Package 11  . metoprolol succinate (TOPROL-XL) 50 MG 24 hr tablet Take 1 tablet (50 mg total) by mouth daily. Take with or immediately following a meal. 90 tablet 3   No current facility-administered medications for this visit.     Past Medical History  Diagnosis Date  . History of chicken pox   . Depression   . Migraines   . History of kidney stones   . History of seizure disorder     as a child. No medication since age 67.  Marland Kitchen Hyperlipidemia   . Hypertension   . Tachycardia   . History of frequent urinary tract infections   . GERD (gastroesophageal reflux disease)   . Binge eating   . Endometriosis   . Allergy     Past Surgical History  Procedure Laterality Date  . Cholecystectomy  2006  . Cesarean section  2002  . Pilonidal cyst excision  2003  . Laporoscopy    . Wisdom tooth extraction Bilateral   . Cesarean section N/A 02/07/2014    Procedure: CESAREAN SECTION;  Surgeon: Shelly Bombard, MD;  Location: [facility];  Service: Obstetrics;  Laterality: N/A;    Social History   Social History  . Marital Status: Married    Spouse Name: N/A  . Number of Children: 1  . Years of Education: N/A    Occupational History  .      Insructor [employer]   Social History Main Topics  . Smoking status: Current Some Day Smoker -- 0.03 packs/day for 20 years    Types: Cigarettes    Last Attempt to Quit: 10/19/2011  . Smokeless tobacco: Never Used     Comment: 4 cigarettes daily  . Alcohol Use: No  . Drug Use: No  . Sexual Activity:    Partners: Male    Birth Control/ Protection: None   Other Topics Concern  . 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 Remaining systems are negative.  Physical Exam: Well-developed obese in no acute distress.  Skin is warm and dry.  HEENT is normal.  Neck is supple.  Chest is clear to auscultation with normal expansion.  Cardiovascular exam is regular rate and rhythm.  Abdominal exam nontender or distended. No masses palpated. Extremities show no edema. neuro grossly intact  ECG sinus rhythm at a rate of 91. No ST changes.

## 2014-11-05 ENCOUNTER — Ambulatory Visit (INDEPENDENT_AMBULATORY_CARE_PROVIDER_SITE_OTHER): Payer: 59 | Admitting: Cardiology

## 2014-11-05 ENCOUNTER — Encounter: Payer: Self-pay | Admitting: Cardiology

## 2014-11-05 VITALS — BP 160/98 | HR 91 | Ht 60.0 in | Wt 216.0 lb

## 2014-11-05 DIAGNOSIS — R002 Palpitations: Secondary | ICD-10-CM | POA: Diagnosis not present

## 2014-11-05 DIAGNOSIS — Z720 Tobacco use: Secondary | ICD-10-CM | POA: Diagnosis not present

## 2014-11-05 DIAGNOSIS — I10 Essential (primary) hypertension: Secondary | ICD-10-CM | POA: Diagnosis not present

## 2014-11-05 MED ORDER — METOPROLOL SUCCINATE ER 50 MG PO TB24: 50.0000 mg | ORAL_TABLET | Freq: Every day | ORAL | Status: DC

## 2014-11-05 NOTE — Assessment & Plan Note (Signed)
Patient is having worsening palpitations. She is breast-feeding but she will discontinue. Add Toprol 50 mg daily. We can consider repeating a monitor in the future if her symptoms worsen.

## 2014-11-05 NOTE — Assessment & Plan Note (Signed)
Patient counseled on discontinuing. 

## 2014-11-05 NOTE — Patient Instructions (Signed)
Your physician recommends that you schedule a follow-up appointment in: 3 MONTHS WITH DR CRENSHAW  START METOPROLOL SUCC ER 50 MG ONCE DAILY

## 2014-11-05 NOTE — Assessment & Plan Note (Signed)
Blood pressure elevated.add Toprol 50 mg daily.

## 2014-11-06 ENCOUNTER — Telehealth: Payer: Self-pay | Admitting: *Deleted

## 2014-11-06 DIAGNOSIS — Z30011 Encounter for initial prescription of contraceptive pills: Secondary | ICD-10-CM

## 2014-11-06 MED ORDER — LEVONORGEST-ETH ESTRAD 91-DAY 0.15-0.03 &0.01 MG PO TABS: 1.0000 | ORAL_TABLET | Freq: Every day | ORAL | Status: DC

## 2014-11-06 NOTE — Telephone Encounter (Signed)
Patient states she is going to have to stop breast feeding because she is having to start her heart medication again. She is presently using the mini pill and wants to know if she needs to switch. 2:19 Spoke with patient - she is unsure about her method- long term because of the endometriosis- she would like to use continuous pills for now and will discuss with Dr Jodi Mourning at her annual. Told patient that would be fine.

## 2015-02-03 NOTE — Progress Notes (Signed)
HPI: FU palpitations and chest pain. Patient has a history of inappropriate sinus tachycardia which is reasonably well controlled with Toprol. Echocardiogram in July 2013 showed normal LV function and no significant valvular disease. Exercise treadmill in August of 2013 showed no chest pain and no ST depression but she did not achieve target heart rate. Since last seen, the patient has dyspnea with more extreme activities but not with routine activities. It is relieved with rest. It is not associated with chest pain. There is no orthopnea, PND or pedal edema. There is no syncope. There is no exertional chest pain. Occasional brief palpitations but improved. Note she is not taking her Toprol daily and she states she forgets.   Current Outpatient Prescriptions  Medication Sig Dispense Refill  . Levonorgestrel-Ethinyl Estradiol (AMETHIA,CAMRESE) 0.15-0.03 &0.01 MG tablet Take 1 tablet by mouth daily. 1 Package 4  . metoprolol succinate (TOPROL-XL) 50 MG 24 hr tablet Take 1 tablet (50 mg total) by mouth daily. Take with or immediately following a meal. 90 tablet 3   No current facility-administered medications for this visit.     Past Medical History  Diagnosis Date  . History of chicken pox   . Depression   . Migraines   . History of kidney stones   . History of seizure disorder     as a child. No medication since age 79.  Marland Kitchen Hyperlipidemia   . Hypertension   . Tachycardia   . History of frequent urinary tract infections   . GERD (gastroesophageal reflux disease)   . Binge eating   . Endometriosis   . Allergy     Past Surgical History  Procedure Laterality Date  . Cholecystectomy  2006  . Cesarean section  2002  . Pilonidal cyst excision  2003  . Laporoscopy    . Wisdom tooth extraction Bilateral   . Cesarean section N/A 02/07/2014    Procedure: CESAREAN SECTION;  Surgeon: Shelly Bombard, MD;  Location: [facility];  Service: Obstetrics;  Laterality: N/A;    Social History    Social History  . Marital Status: Married    Spouse Name: N/A  . Number of Children: 1  . Years of Education: N/A   Occupational History  .      Insructor [employer]   Social History Main Topics  . Smoking status: Current Some Day Smoker -- 0.03 packs/day for 20 years    Types: Cigarettes    Last Attempt to Quit: 10/19/2011  . Smokeless tobacco: Never Used     Comment: 4 cigarettes daily  . Alcohol Use: No  . Drug Use: No  . Sexual Activity:    Partners: Male    Birth Control/ Protection: None   Other Topics Concern  . 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 Remaining systems are negative.  Physical Exam: Well-developed obese in no acute distress.  Skin is warm and dry.  HEENT is normal.  Neck is supple.  Chest is clear to auscultation with normal expansion.  Cardiovascular exam is regular rate and rhythm.  Abdominal exam nontender or distended. No masses  palpated. Extremities show no edema. neuro grossly intact

## 2015-02-09 ENCOUNTER — Ambulatory Visit (INDEPENDENT_AMBULATORY_CARE_PROVIDER_SITE_OTHER): Payer: 59 | Admitting: Cardiology

## 2015-02-09 ENCOUNTER — Encounter: Payer: Self-pay | Admitting: Cardiology

## 2015-02-09 VITALS — BP 136/80 | HR 78 | Ht 60.0 in | Wt 218.0 lb

## 2015-02-09 DIAGNOSIS — R002 Palpitations: Secondary | ICD-10-CM | POA: Diagnosis not present

## 2015-02-09 DIAGNOSIS — I10 Essential (primary) hypertension: Secondary | ICD-10-CM | POA: Diagnosis not present

## 2015-02-09 DIAGNOSIS — Z720 Tobacco use: Secondary | ICD-10-CM | POA: Diagnosis not present

## 2015-02-09 NOTE — Assessment & Plan Note (Signed)
Blood pressure borderline. I have asked her to increase her Toprol to 50 mg daily.

## 2015-02-09 NOTE — Assessment & Plan Note (Signed)
Patient states her palpitations have improvedBut persist. However she is only taking Toprol twice weekly. I've asked her to increase this to daily.

## 2015-02-09 NOTE — Patient Instructions (Signed)
Your physician wants you to follow-up in: ONE YEAR WITH DR CRENSHAW You will receive a reminder letter in the mail two months in advance. If you don't receive a letter, please call our office to schedule the follow-up appointment.   If you need a refill on your cardiac medications before your next appointment, please call your pharmacy.  

## 2015-02-09 NOTE — Assessment & Plan Note (Signed)
Patient continues to smoke. I discussed the importance of discontinuing.

## 2015-04-07 ENCOUNTER — Encounter: Payer: Self-pay | Admitting: Obstetrics

## 2015-04-08 ENCOUNTER — Other Ambulatory Visit: Payer: Self-pay | Admitting: Obstetrics

## 2015-04-08 DIAGNOSIS — I10 Essential (primary) hypertension: Secondary | ICD-10-CM

## 2015-04-08 MED ORDER — TRIAMTERENE-HCTZ 37.5-25 MG PO CAPS: 1.0000 | ORAL_CAPSULE | Freq: Every day | ORAL | Status: DC

## 2015-04-22 ENCOUNTER — Telehealth: Payer: Self-pay | Admitting: *Deleted

## 2015-04-22 ENCOUNTER — Other Ambulatory Visit: Payer: Self-pay | Admitting: *Deleted

## 2015-04-22 DIAGNOSIS — Z3041 Encounter for surveillance of contraceptive pills: Secondary | ICD-10-CM

## 2015-04-22 MED ORDER — NORETHINDRONE-ETH ESTRADIOL 1-35 MG-MCG PO TABS
ORAL_TABLET | ORAL | Status: DC
Start: 2015-04-22 — End: 2016-03-01

## 2015-04-22 NOTE — Telephone Encounter (Signed)
Patient is having BTB on the continuous pill in her third month.This has happened with the last 2 packs. Per Dr Jodi Mourning- she is probably bleeding due to the drop in dose of the last month- can try her on a more steady dosing- ON 1/35 continuous and see how she does. Call to patient and she is going to try.

## 2015-05-07 ENCOUNTER — Ambulatory Visit: Payer: Medicaid Other | Admitting: Obstetrics

## 2015-05-18 ENCOUNTER — Ambulatory Visit (INDEPENDENT_AMBULATORY_CARE_PROVIDER_SITE_OTHER): Payer: BLUE CROSS/BLUE SHIELD | Admitting: Obstetrics

## 2015-05-18 ENCOUNTER — Encounter: Payer: Self-pay | Admitting: Obstetrics

## 2015-05-18 VITALS — BP 118/86 | HR 101 | Temp 99.3°F | Wt 205.0 lb

## 2015-05-18 DIAGNOSIS — Z3041 Encounter for surveillance of contraceptive pills: Secondary | ICD-10-CM

## 2015-05-18 DIAGNOSIS — Z01419 Encounter for gynecological examination (general) (routine) without abnormal findings: Secondary | ICD-10-CM | POA: Diagnosis not present

## 2015-05-18 DIAGNOSIS — N939 Abnormal uterine and vaginal bleeding, unspecified: Secondary | ICD-10-CM

## 2015-05-19 ENCOUNTER — Encounter: Payer: Self-pay | Admitting: Obstetrics

## 2015-05-19 NOTE — Progress Notes (Addendum)
Subjective:        Chelsea Haley is a 40 y.o. female here for a routine exam.  Current complaints: Irregular light spotting between periods.    Personal health questionnaire:  Is patient [name], have a family history of breast and/or ovarian cancer: no Is there a family history of uterine cancer diagnosed at age < 37, gastrointestinal cancer, urinary tract cancer, family member who is a Field seismologist syndrome-associated carrier: no Is the patient overweight and hypertensive, family history of diabetes, personal history of gestational diabetes, preeclampsia or PCOS: no Is patient over 44, have PCOS,  family history of premature CHD under age 50, diabetes, smoke, have hypertension or peripheral artery disease:  no At any time, has a partner hit, kicked or otherwise hurt or frightened you?: no Over the past 2 weeks, have you felt down, depressed or hopeless?: no Over the past 2 weeks, have you felt little interest or pleasure in doing things?:no   Gynecologic History Patient's last menstrual period was 04/22/2015 (approximate). Contraception: OCP (estrogen/progesterone) Last Pap: 2015. Results were: normal Last mammogram: n/a. Results were: n/a  Obstetric History OB History  Gravida Para Term Preterm AB SAB TAB Ectopic Multiple Living  2 2 1 1  0 0 0 0 0 2    # Outcome Date GA Lbr Len/2nd Weight Sex Delivery Anes PTL Lv  2 Term 02/07/14 [redacted]w[redacted]d  5 lb 8.5 oz (2.509 kg) F CS-LTranv Spinal  Y     Comments: features of Down's syndrome, [redacted] wks EGA  1 Preterm 02/06/01 [redacted]w[redacted]d  5 lb (2.268 kg) M CS-LTranv EPI N Y      Past Medical History  Diagnosis Date  . History of chicken pox   . Depression   . Migraines   . History of kidney stones   . History of seizure disorder     as a child. No medication since age 36.  Marland Kitchen Hyperlipidemia   . Hypertension   . Tachycardia   . History of frequent urinary tract infections   . GERD (gastroesophageal reflux disease)   . Binge eating   .  Endometriosis   . Allergy     Past Surgical History  Procedure Laterality Date  . Cholecystectomy  2006  . Cesarean section  2002  . Pilonidal cyst excision  2003  . Laporoscopy    . Wisdom tooth extraction Bilateral   . Cesarean section N/A 02/07/2014    Procedure: CESAREAN SECTION;  Surgeon: Shelly Bombard, MD;  Location: [facility];  Service: Obstetrics;  Laterality: N/A;     Current outpatient prescriptions:  .  metoprolol succinate (TOPROL-XL) 50 MG 24 hr tablet, Take 1 tablet (50 mg total) by mouth daily. Take with or immediately following a meal., Disp: 90 tablet, Rfl: 3 .  norethindrone-ethinyl estradiol 1/35 ([place] 1/35, 28,) tablet, Patient is to take continuous active pills for 3 months- then have a cycle., Disp: 4 Package, Rfl: 4 .  triamterene-hydrochlorothiazide (DYAZIDE) 37.5-25 MG capsule, Take 1 each (1 capsule total) by mouth daily., Disp: 30 capsule, Rfl: 11 Allergies  Allergen Reactions  . Adhesive [Tape]     Skin sensitivity  . Banana     Migraine, if they are cooked or over ripe.   . Morphine And Related Itching    hallucinations  . Other Itching    WALNUTS.  Gums itch. Cashews.   . Pertussis Vaccines     seizures  . Tramadol Anxiety    Social History  Substance Use  Topics  . Smoking status: Current Some Day Smoker -- 0.03 packs/day for 20 years    Types: Cigarettes    Last Attempt to Quit: 10/19/2011  . Smokeless tobacco: Never Used     Comment: 4 cigarettes daily  . Alcohol Use: No    Family History  Problem Relation Age of Onset  . Arthritis Father   . Hypertension Father   . Heart disease Maternal Grandmother     ICD  . Cancer Maternal Grandfather     lung  . Diabetes Neg Hx       Review of Systems  Constitutional: negative for fatigue and weight loss Respiratory: negative for cough and wheezing Cardiovascular: negative for chest pain, fatigue and palpitations Gastrointestinal: negative for abdominal pain and change in bowel  habits Musculoskeletal:negative for myalgias Neurological: negative for gait problems and tremors Behavioral/Psych: negative for abusive relationship, depression Endocrine: negative for temperature intolerance   Genitourinary: positive for abnormal menstrual periods.  Negative for genital lesions, hot flashes, sexual problems and vaginal discharge Integument/breast: negative for breast lump, breast tenderness, nipple discharge and skin lesion(s)    Objective:       BP 118/86 mmHg  Pulse 101  Temp(Src) 99.3 F (37.4 C)  Wt 205 lb (92.987 kg)  LMP 04/22/2015 (Approximate)  Breastfeeding? No General:   alert  Skin:   no rash or abnormalities  Lungs:   clear to auscultation bilaterally  Heart:   regular rate and rhythm, S1, S2 normal, no murmur, click, rub or gallop  Breasts:   normal without suspicious masses, skin or nipple changes or axillary nodes  Abdomen:  normal findings: no organomegaly, soft, non-tender and no hernia  Pelvis:  External genitalia: normal general appearance Urinary system: urethral meatus normal and bladder without fullness, nontender Vaginal: normal without tenderness, induration or masses Cervix: normal appearance Adnexa: normal bimanual exam Uterus: anteverted and non-tender, normal size   Lab Review Urine pregnancy test Labs reviewed yes Radiologic studies reviewed no    Assessment:    Healthy female exam.    AUB  Contraceptive management.  Pleased with OCP's.   Plan:     Continue OCP's  Ultrasound ordered  Education reviewed: low fat, low cholesterol diet, safe sex/STD prevention, self breast exams and weight bearing exercise. Follow up in: 1 year.   No orders of the defined types were placed in this encounter.   Orders Placed This Encounter  Procedures  . SureSwab Bacterial Vaginosis/itis  . US Pelvis Complete    Standing Status: Future     Number of Occurrences:      Standing Expiration Date: 07/17/2016    Order Specific Question:   Reason for Exam (SYMPTOM  OR DIAGNOSIS REQUIRED)    Answer:  AUB    Order Specific Question:  Preferred imaging location?    Answer:  Internal  . US Transvaginal Non-OB    Standing Status: Future     Number of Occurrences:      Standing Expiration Date: 07/17/2016    Order Specific Question:  Reason for Exam (SYMPTOM  OR DIAGNOSIS REQUIRED)    Answer:  AUB    Order Specific Question:  Preferred imaging location?    Answer:  Internal

## 2015-05-20 LAB — PAP, TP IMAGING W/ HPV RNA, RFLX HPV TYPE 16,18/45: HPV MRNA, HIGH RISK: NOT DETECTED

## 2015-05-21 ENCOUNTER — Encounter: Payer: Self-pay | Admitting: Obstetrics

## 2015-05-21 ENCOUNTER — Ambulatory Visit (INDEPENDENT_AMBULATORY_CARE_PROVIDER_SITE_OTHER): Payer: BLUE CROSS/BLUE SHIELD | Admitting: Obstetrics

## 2015-05-21 ENCOUNTER — Ambulatory Visit (INDEPENDENT_AMBULATORY_CARE_PROVIDER_SITE_OTHER): Payer: BLUE CROSS/BLUE SHIELD

## 2015-05-21 VITALS — BP 123/78 | HR 92 | Temp 99.3°F | Wt 208.0 lb

## 2015-05-21 DIAGNOSIS — N939 Abnormal uterine and vaginal bleeding, unspecified: Secondary | ICD-10-CM | POA: Diagnosis not present

## 2015-05-21 DIAGNOSIS — Z3049 Encounter for surveillance of other contraceptives: Secondary | ICD-10-CM

## 2015-05-21 LAB — SURESWAB BACTERIAL VAGINOSIS/ITIS
Atopobium vaginae: NOT DETECTED Log (cells/mL)
BV CATEGORY: UNDETERMINED — AB
C. ALBICANS, DNA: NOT DETECTED
C. GLABRATA, DNA: NOT DETECTED
C. PARAPSILOSIS, DNA: DETECTED — AB
C. tropicalis, DNA: NOT DETECTED
Gardnerella vaginalis: 7 Log (cells/mL)
LACTOBACILLUS SPECIES: 7.3 Log (cells/mL)
MEGASPHAERA SPECIES: NOT DETECTED Log (cells/mL)
T. vaginalis RNA, QL TMA: NOT DETECTED

## 2015-05-22 ENCOUNTER — Other Ambulatory Visit: Payer: Self-pay | Admitting: Obstetrics

## 2015-05-22 ENCOUNTER — Encounter: Payer: Self-pay | Admitting: Obstetrics

## 2015-05-22 DIAGNOSIS — N760 Acute vaginitis: Principal | ICD-10-CM

## 2015-05-22 DIAGNOSIS — B3731 Acute candidiasis of vulva and vagina: Secondary | ICD-10-CM

## 2015-05-22 DIAGNOSIS — B9689 Other specified bacterial agents as the cause of diseases classified elsewhere: Secondary | ICD-10-CM

## 2015-05-22 DIAGNOSIS — B373 Candidiasis of vulva and vagina: Secondary | ICD-10-CM

## 2015-05-22 MED ORDER — METRONIDAZOLE 500 MG PO TABS: 500.0000 mg | ORAL_TABLET | Freq: Two times a day (BID) | ORAL | Status: DC

## 2015-05-22 MED ORDER — FLUCONAZOLE 150 MG PO TABS: 150.0000 mg | ORAL_TABLET | Freq: Once | ORAL | Status: DC

## 2015-05-22 NOTE — Progress Notes (Signed)
Patient ID: Chelsea Haley, female   DOB: [birth year], 40 y.o.   MRN: [id number]  Chief Complaint  Patient presents with  . Follow-up    Ultrasound Results     HPI Chelsea Haley is a 40 y.o. female.  Presents for ultrasound results.  H/O AUB on OCP's.  HPI  Past Medical History  Diagnosis Date  . History of chicken pox   . Depression   . Migraines   . History of kidney stones   . History of seizure disorder     as a child. No medication since age 65.  Marland Kitchen Hyperlipidemia   . Hypertension   . Tachycardia   . History of frequent urinary tract infections   . GERD (gastroesophageal reflux disease)   . Binge eating   . Endometriosis   . Allergy     Past Surgical History  Procedure Laterality Date  . Cholecystectomy  2006  . Cesarean section  2002  . Pilonidal cyst excision  2003  . Laporoscopy    . Wisdom tooth extraction Bilateral   . Cesarean section N/A 02/07/2014    Procedure: CESAREAN SECTION;  Surgeon: Shelly Bombard, MD;  Location: [facility];  Service: Obstetrics;  Laterality: N/A;    Family History  Problem Relation Age of Onset  . Arthritis Father   . Hypertension Father   . Diabetes Father   . Heart disease Maternal Grandmother     ICD  . Cancer Maternal Grandfather     lung    Social History Social History  Substance Use Topics  . Smoking status: Current Some Day Smoker -- 0.03 packs/day for 20 years    Types: Cigarettes    Last Attempt to Quit: 10/19/2011  . Smokeless tobacco: Never Used     Comment: 4 cigarettes daily  . Alcohol Use: No    Allergies  Allergen Reactions  . Adhesive [Tape]     Skin sensitivity  . Banana     Migraine, if they are cooked or over ripe.   . Morphine And Related Itching    hallucinations  . Other Itching    WALNUTS.  Gums itch. Cashews.   . Pertussis Vaccines     seizures  . Tramadol Anxiety    Current Outpatient Prescriptions  Medication Sig Dispense Refill  . metoprolol succinate (TOPROL-XL) 50 MG 24 hr  tablet Take 1 tablet (50 mg total) by mouth daily. Take with or immediately following a meal. 90 tablet 3  . norethindrone-ethinyl estradiol 1/35 ([place] 1/35, 28,) tablet Patient is to take continuous active pills for 3 months- then have a cycle. 4 Package 4  . triamterene-hydrochlorothiazide (DYAZIDE) 37.5-25 MG capsule Take 1 each (1 capsule total) by mouth daily. 30 capsule 11  . fluconazole (DIFLUCAN) 150 MG tablet Take 1 tablet (150 mg total) by mouth once. 1 tablet 2  . metroNIDAZOLE (FLAGYL) 500 MG tablet Take 1 tablet (500 mg total) by mouth 2 (two) times daily. 14 tablet 2   No current facility-administered medications for this visit.    Review of Systems Review of Systems Constitutional: negative for fatigue and weight loss Respiratory: negative for cough and wheezing Cardiovascular: negative for chest pain, fatigue and palpitations Gastrointestinal: negative for abdominal pain and change in bowel habits Genitourinary: positive for light spotting in between periods Integument/breast: negative for nipple discharge Musculoskeletal:negative for myalgias Neurological: negative for gait problems and tremors Behavioral/Psych: negative for abusive relationship, depression Endocrine: negative for temperature intolerance     Blood  pressure 123/78, pulse 92, temperature 99.3 F (37.4 C), weight 208 lb (94.348 kg), last menstrual period 04/22/2015, not currently breastfeeding.  Physical Exam Physical Exam:  Deferred 100% of 15 min visit spent on counseling and coordination of care.   Data Reviewed Ultrasound  Assessment     AUB Contraception:  OCP's     Plan    Continue OCP's F/U prn   No orders of the defined types were placed in this encounter.   No orders of the defined types were placed in this encounter.

## 2015-05-26 ENCOUNTER — Encounter: Payer: Self-pay | Admitting: Obstetrics

## 2015-06-05 IMAGING — US US OB FOLLOW-UP
1 series · 12 of 28 positions shown · non-contrast
Comparison: none

[Series 1: us ob follow-up · 0.23mm/px · 12 of 69 slices shown]
[im 3/69]
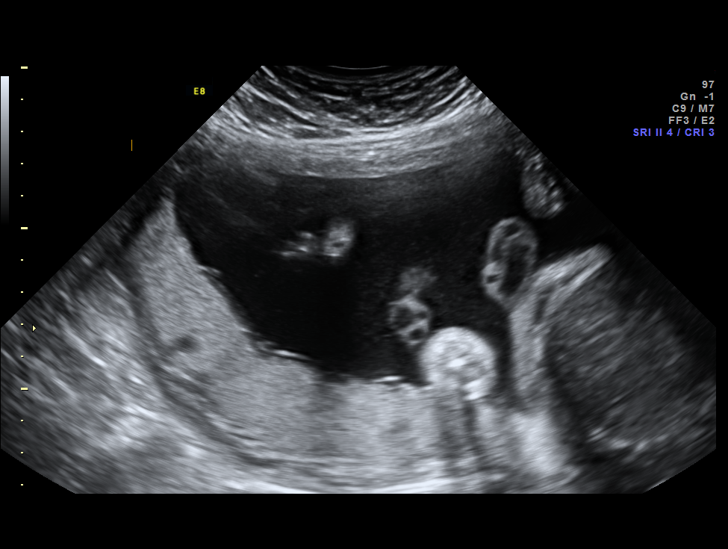
[im 8/69]
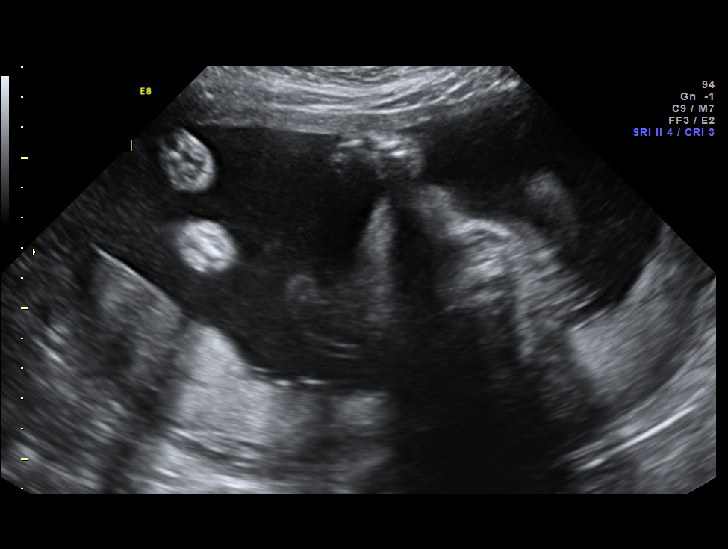
[im 13/69]
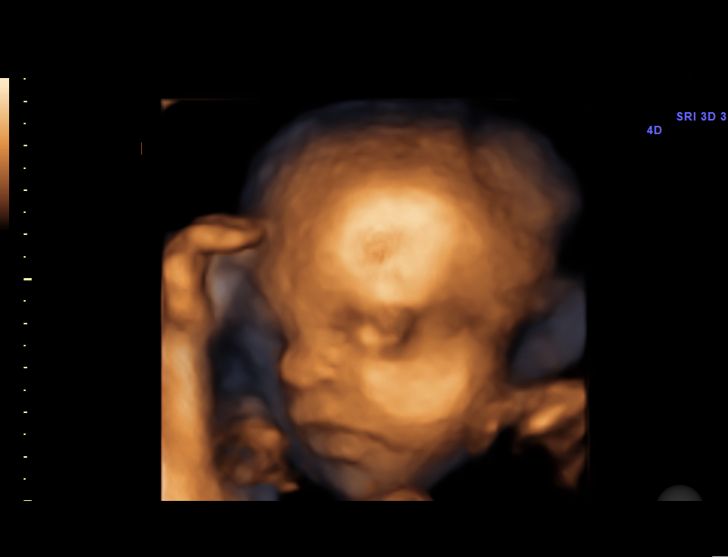
[im 21/69]
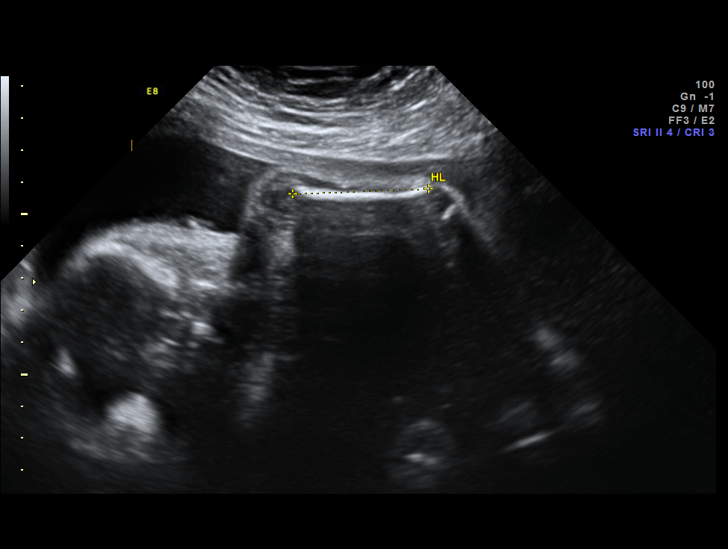
[im 26/69]
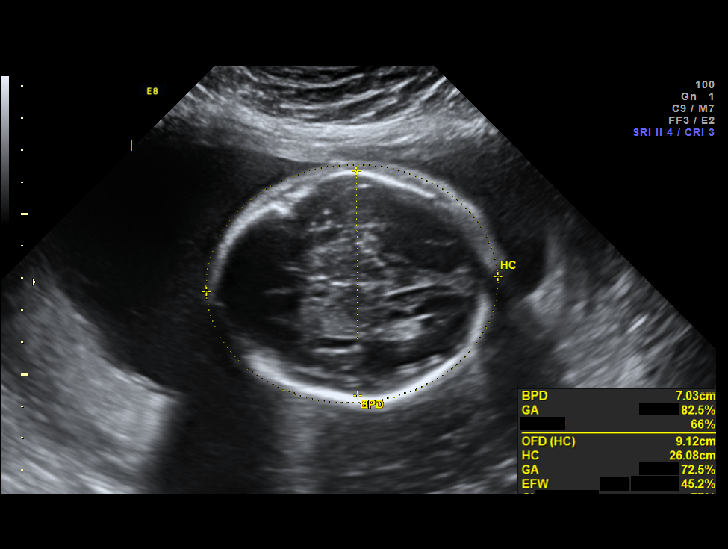
[im 31/69]
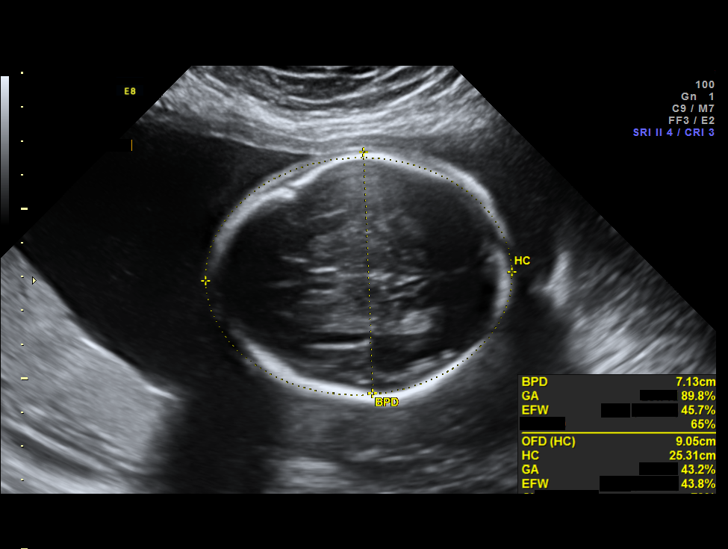
[im 38/69]
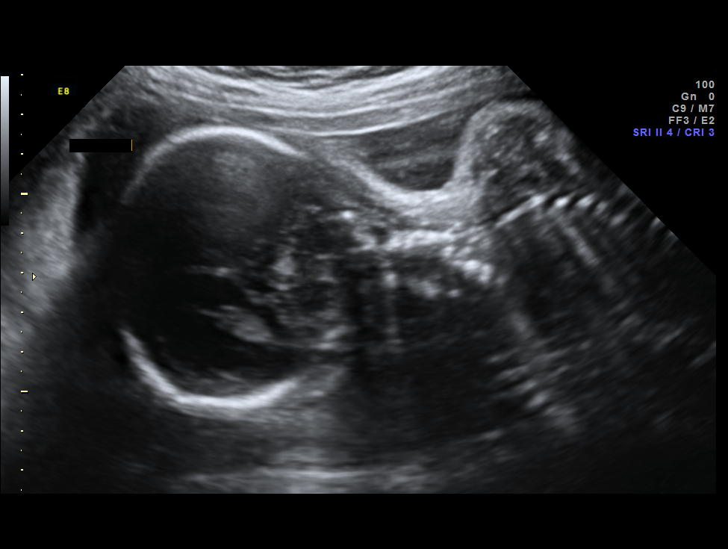
[im 43/69]
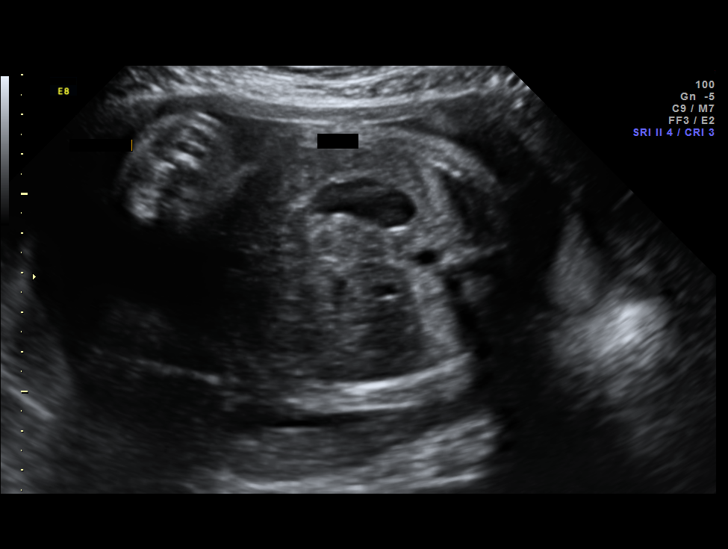
[im 48/69]
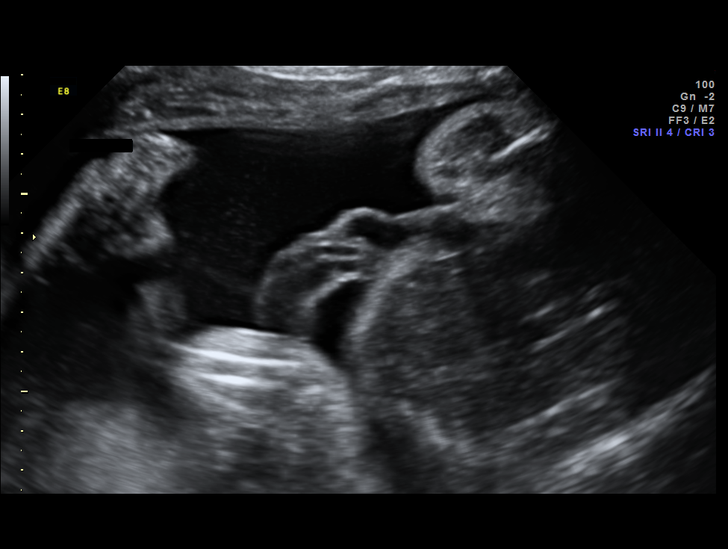
[im 56/69]
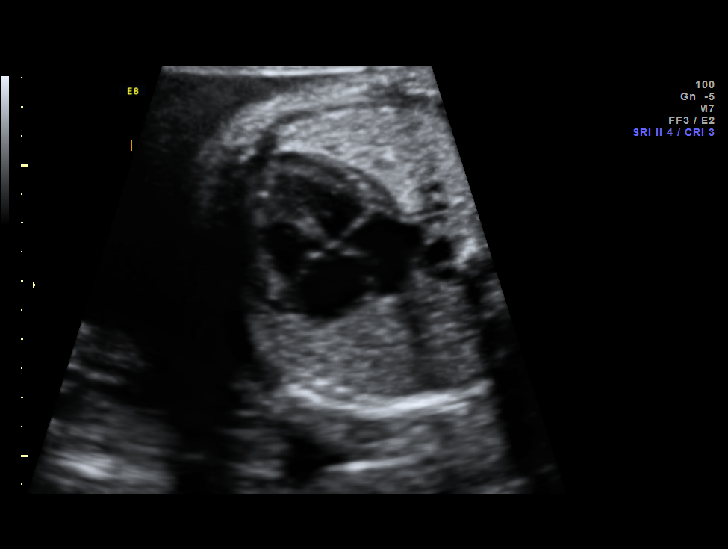
[im 61/69]
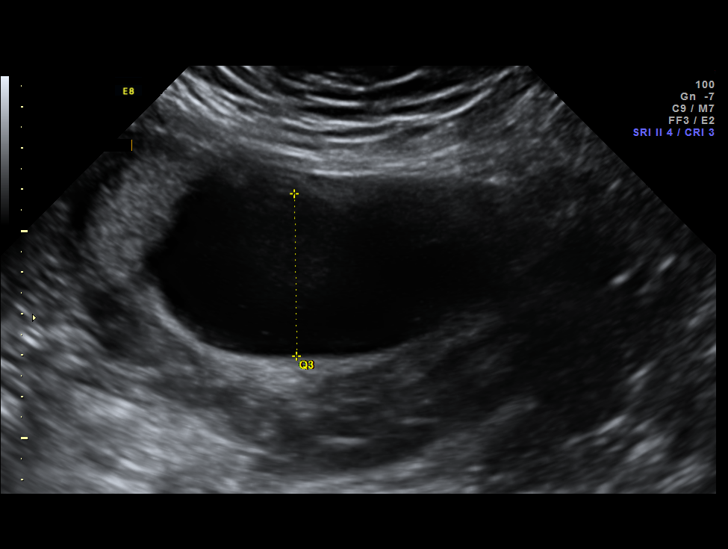
[im 66/69]
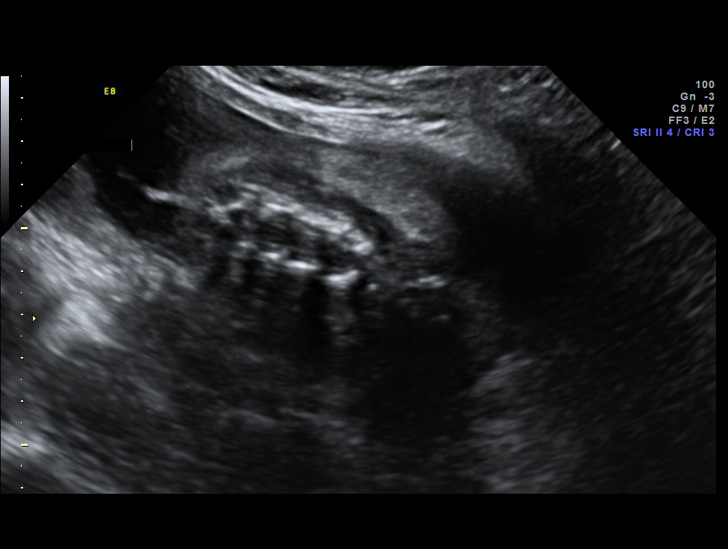

[12 of 28 positions shown; findings below may reference images not displayed]

OBSTETRICS REPORT
                      (Signed Final 11/28/2013 [DATE])

Service(s) Provided

 US OB FOLLOW UP                                       76816.1
Indications

 Advanced maternal age (AMA), Multigravida (38)
 Poor obstetric history: Previous preterm delivery
 High risk NIPS for trisomy 21; normal fetal ECHO
 Cigarette smoker
 Previous cesarean section
Fetal Evaluation

 Num Of Fetuses:    1
 Fetal Heart Rate:  132                          bpm
 Cardiac Activity:  Observed
 Presentation:      Breech
 Placenta:          Posterior, above cervical
                    os

 Amniotic Fluid
 AFI FV:      Subjectively within normal limits
 AFI Sum:     18      cm       69  %Tile
Biometry

 BPD:     70.8  mm     G. Age:  28w 3d                CI:        76.22   70 - 86
                                                      FL/HC:      18.1   18.6 -

 HC:       257  mm     G. Age:  27w 6d       58  %    HC/AC:      1.16   1.05 -

 AC:     222.3  mm     G. Age:  26w 4d       36  %    FL/BPD:     65.5   71 - 87
 FL:      46.4  mm     G. Age:  25w 3d        7  %    FL/AC:      20.9   20 - 24
 HUM:     42.7  mm     G. Age:  25w 4d       16  %
 CER:     29.6  mm     G. Age:  26w 2d       37  %

 Est. FW:     937  gm      2 lb 1 oz     44  %
Gestational Age

 LMP:           26w 6d        Date:  05/24/13                 EDD:   02/28/14
 U/S Today:     27w 1d                                        EDD:   02/26/14
 Best:          26w 6d     Det. By:  LMP  (05/24/13)          EDD:   02/28/14
Anatomy
 Cranium:          Appears normal         Aortic Arch:      Previously seen
 Fetal Cavum:      Appears normal         Ductal Arch:      Previously seen
 Ventricles:       Appears normal         Diaphragm:        Appears normal
 Choroid Plexus:   Appears normal         Stomach:          Appears normal, left
                                                            sided
 Cerebellum:       Appears normal         Abdomen:          Appears normal
 Posterior Fossa:  Appears normal         Abdominal Wall:   Appears nml (cord
                                                            insert, abd wall)
 Nuchal Fold:      Previously seen        Cord Vessels:     Appears normal (3
                                                            vessel cord)
 Face:             Appears normal         Kidneys:          Appear normal
                   (orbits and profile)
 Lips:             Appears normal         Bladder:          Appears normal
 Heart:            Appears normal         Spine:            Previously seen
                   (4CH, axis, and
                   situs)
 RVOT:             Previously seen        Lower             Previously seen
                                          Extremities:
 LVOT:             Previously seen        Upper             Previously seen
                                          Extremities:

 Other:  Female gender. Heels and 5th digit previously seen.
Cervix Uterus Adnexa

 Cervical Length:    3.8      cm

 Cervix:       Normal appearance by transabdominal scan.

 Left Ovary:    Previously seen.
 Right Ovary:   Previously seen
Impression

 SIUP at 26+6 weeks
 High risk for trisomy 21
 Normal interval anatomy; anatomic survey complete
 Normal amniotic fluid volume
 Appropriate interval growth with EFW at the 44th %tile
Recommendations

 Follow-up ultrasound for growth in 6 weeks

 questions or concerns.

## 2015-06-08 ENCOUNTER — Ambulatory Visit (INDEPENDENT_AMBULATORY_CARE_PROVIDER_SITE_OTHER): Payer: BLUE CROSS/BLUE SHIELD | Admitting: Obstetrics

## 2015-06-08 VITALS — BP 120/78 | HR 86 | Temp 98.2°F | Wt 202.0 lb

## 2015-06-08 DIAGNOSIS — N939 Abnormal uterine and vaginal bleeding, unspecified: Secondary | ICD-10-CM

## 2015-06-11 ENCOUNTER — Encounter: Payer: Self-pay | Admitting: Obstetrics

## 2015-06-11 NOTE — Progress Notes (Signed)
Patient ID: Chelsea Haley, female   DOB: 1975/08/14, 40 y.o.   MRN: [id number]  Chief Complaint  Patient presents with  . Gynecologic Exam    pre op- patient is in the office to discuss ablation.    HPI Chelsea Haley is a 40 y.o. female.  Heavy and painful periods.  HPI  Past Medical History  Diagnosis Date  . History of chicken pox   . Depression   . Migraines   . History of kidney stones   . History of seizure disorder     as a child. No medication since age 89.  Marland Kitchen Hyperlipidemia   . Hypertension   . Tachycardia   . History of frequent urinary tract infections   . GERD (gastroesophageal reflux disease)   . Binge eating   . Endometriosis   . Allergy     Past Surgical History  Procedure Laterality Date  . Cholecystectomy  2006  . Cesarean section  2002  . Pilonidal cyst excision  2003  . Laporoscopy    . Wisdom tooth extraction Bilateral   . Cesarean section N/A 02/07/2014    Procedure: CESAREAN SECTION;  Surgeon: Shelly Bombard, MD;  Location: [facility];  Service: Obstetrics;  Laterality: N/A;    Family History  Problem Relation Age of Onset  . Arthritis Father   . Hypertension Father   . Diabetes Father   . Heart disease Maternal Grandmother     ICD  . Cancer Maternal Grandfather     lung    Social History Social History  Substance Use Topics  . Smoking status: Current Some Day Smoker -- 0.03 packs/day for 20 years    Types: Cigarettes    Last Attempt to Quit: 10/19/2011  . Smokeless tobacco: Never Used     Comment: 4 cigarettes daily  . Alcohol Use: No    Allergies  Allergen Reactions  . Adhesive [Tape]     Skin sensitivity  . Banana     Migraine, if they are cooked or over ripe.   . Morphine And Related Itching    hallucinations  . Other Itching    WALNUTS.  Gums itch. Cashews.   . Pertussis Vaccines     seizures  . Tramadol Anxiety    Current Outpatient Prescriptions  Medication Sig Dispense Refill  . metoprolol succinate (TOPROL-XL)  50 MG 24 hr tablet Take 1 tablet (50 mg total) by mouth daily. Take with or immediately following a meal. 90 tablet 3  . norethindrone-ethinyl estradiol 1/35 ([place] 1/35, 28,) tablet Patient is to take continuous active pills for 3 months- then have a cycle. 4 Package 4  . triamterene-hydrochlorothiazide (DYAZIDE) 37.5-25 MG capsule Take 1 each (1 capsule total) by mouth daily. 30 capsule 11   No current facility-administered medications for this visit.    Review of Systems Review of Systems Constitutional: negative for fatigue and weight loss Respiratory: negative for cough and wheezing Cardiovascular: negative for chest pain, fatigue and palpitations Gastrointestinal: negative for abdominal pain and change in bowel habits Genitourinary: positive for heavy and painful periods Integument/breast: negative for nipple discharge Musculoskeletal:negative for myalgias Neurological: negative for gait problems and tremors Behavioral/Psych: negative for abusive relationship, depression Endocrine: negative for temperature intolerance     Blood pressure 120/78, pulse 86, temperature 98.2 F (36.8 C), weight 202 lb (91.627 kg), last menstrual period 04/22/2015, not currently breastfeeding.  Physical Exam Physical Exam:  Deferred  100% of 15 min visit spent on counseling and coordination of care.   Data Reviewed Labs Ultrasound  Assessment     AUB.  Endometrial Ablation recommended and agreed to.     Plan    Will schedule Endometrial Ablation.   No orders of the defined types were placed in this encounter.   No orders of the defined types were placed in this encounter.

## 2015-06-22 ENCOUNTER — Encounter (HOSPITAL_COMMUNITY): Payer: Self-pay

## 2015-06-22 ENCOUNTER — Encounter (HOSPITAL_COMMUNITY)
Admission: RE | Admit: 2015-06-22 | Discharge: 2015-06-22 | Disposition: A | Discharge status: Home / Self Care | Payer: BLUE CROSS/BLUE SHIELD | Source: Ambulatory Visit | Attending: Obstetrics | Admitting: Obstetrics

## 2015-06-22 ENCOUNTER — Other Ambulatory Visit: Payer: Self-pay | Admitting: *Deleted

## 2015-06-22 DIAGNOSIS — I10 Essential (primary) hypertension: Secondary | ICD-10-CM | POA: Insufficient documentation

## 2015-06-22 DIAGNOSIS — N949 Unspecified condition associated with female genital organs and menstrual cycle: Secondary | ICD-10-CM | POA: Insufficient documentation

## 2015-06-22 DIAGNOSIS — Z01812 Encounter for preprocedural laboratory examination: Secondary | ICD-10-CM | POA: Diagnosis present

## 2015-06-22 HISTORY — DX: Unspecified convulsions: R56.9

## 2015-06-22 HISTORY — DX: Central auditory processing disorder: H93.25

## 2015-06-22 HISTORY — DX: Other specified postprocedural states: R11.2

## 2015-06-22 HISTORY — DX: Other specified postprocedural states: Z98.890

## 2015-06-22 HISTORY — DX: Hemangioma of intra-abdominal structures: D18.03

## 2015-06-22 HISTORY — DX: Polycystic ovarian syndrome: E28.2

## 2015-06-22 LAB — BASIC METABOLIC PANEL
ANION GAP: 8 (ref 5–15)
BUN: 15 mg/dL (ref 6–20)
CALCIUM: 9.7 mg/dL (ref 8.9–10.3)
CO2: 24 mmol/L (ref 22–32)
Chloride: 103 mmol/L (ref 101–111)
Creatinine, Ser: 0.8 mg/dL (ref 0.44–1.00)
GFR calc Af Amer: >60 mL/min (ref >60)
GLUCOSE: 102 mg/dL — AB (ref 65–99)
POTASSIUM: 3.8 mmol/L (ref 3.5–5.1)
Sodium: 135 mmol/L (ref 135–145)

## 2015-06-22 LAB — CBC
HEMATOCRIT: 42.5 % (ref 36.0–46.0)
Hemoglobin: 14.8 g/dL (ref 12.0–15.0)
MCH: 30.5 pg (ref 26.0–34.0)
MCHC: 34.8 g/dL (ref 30.0–36.0)
MCV: 87.4 fL (ref 78.0–100.0)
PLATELETS: 327 10*3/uL (ref 150–400)
RBC: 4.86 MIL/uL (ref 3.87–5.11)
RDW: 13.7 % (ref 11.5–15.5)
WBC: 11 10*3/uL — AB (ref 4.0–10.5)

## 2015-06-22 NOTE — Patient Instructions (Signed)
Your procedure is scheduled on:  Friday, July 03, 2015  Enter through the Main Entrance of [facility] at:  7:30 AM  Pick up the phone at the desk and dial [phone].  Call this number if you have problems the morning of surgery: [phone].  Remember: Do NOT eat food or drink after:  Midnight Thursday, July 02, 2015  Take these medicines the morning of surgery with a SIP OF WATER:  Metoprolol, Triamterene-HCTZ  Do NOT wear jewelry (body piercing), metal hair clips/bobby pins, make-up, or nail polish. Do NOT wear lotions, powders, or perfumes.  You may wear deodorant. Do NOT shave for 48 hours prior to surgery. Do NOT bring valuables to the hospital. Contacts, dentures, or bridgework may not be worn into surgery.  Have a responsible adult drive you home and stay with you for 24 hours after your procedure

## 2015-06-30 IMAGING — US US ABDOMEN LIMITED
1 series · 14 of 25 positions shown · non-contrast
Comparison: 09/06/2013

CLINICAL DATA: Hemangioma of the liver

EXAM:
US ABDOMEN LIMITED - RIGHT UPPER QUADRANT

[Series 1: us abdomen limited · 0.21mm/px · 40 acquisitions, 14 frames shown]
[im 1/40]
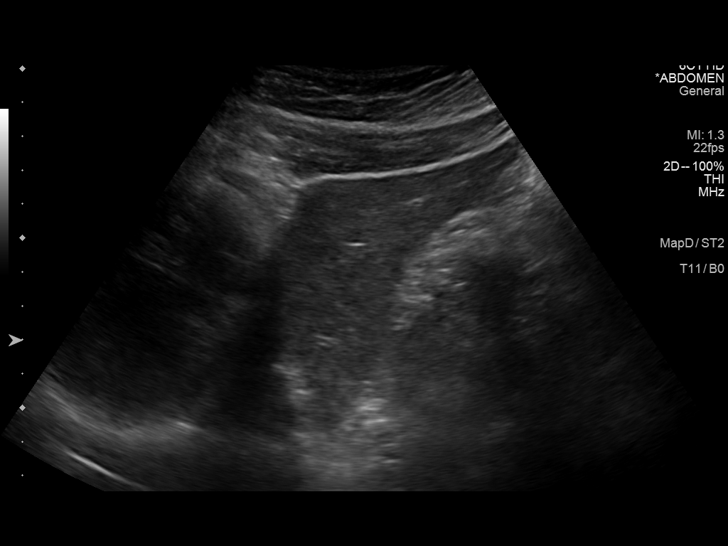
[im 4/40]
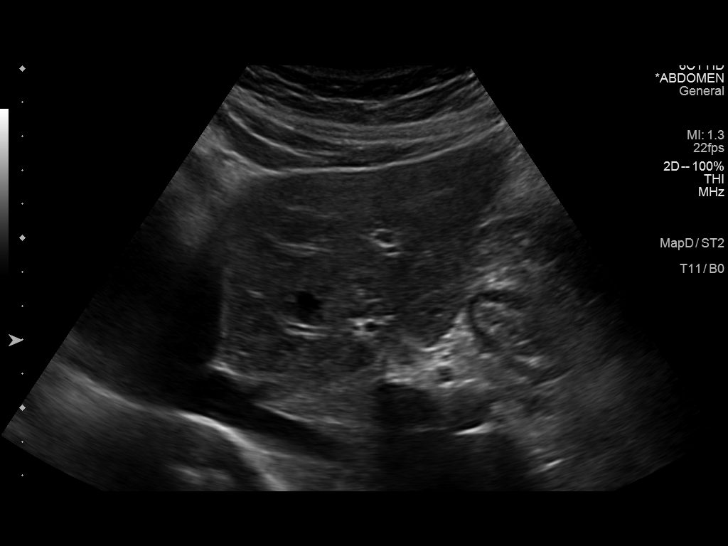
[im 7/40]
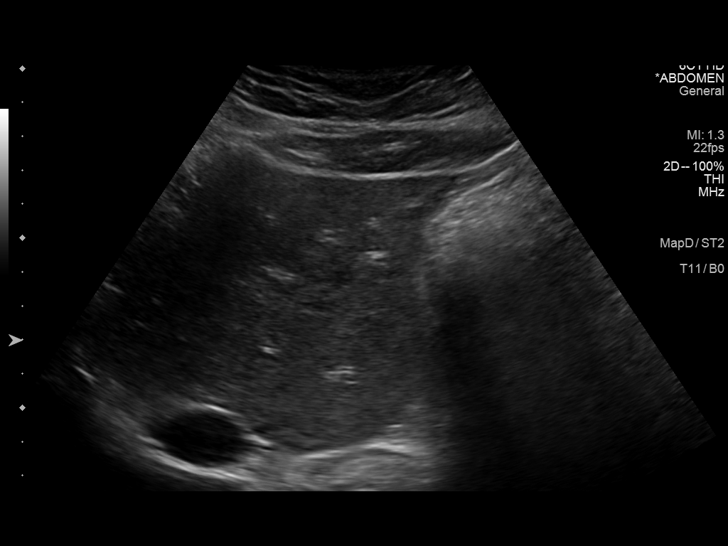
[im 10/40]
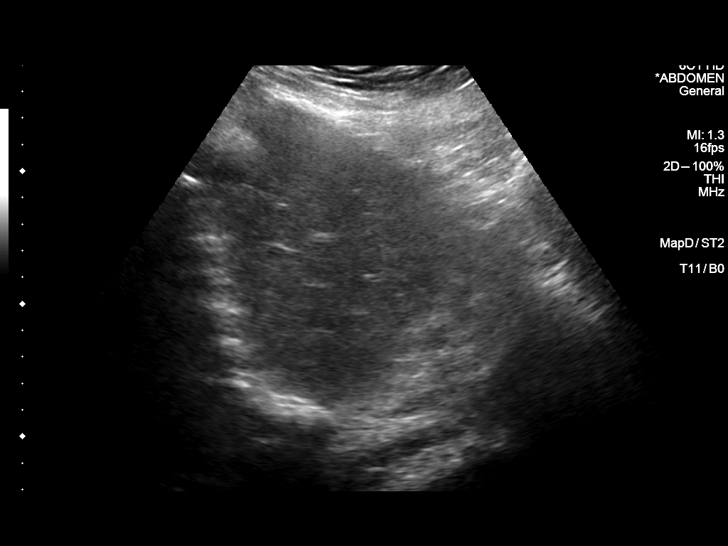
[im 14/40]
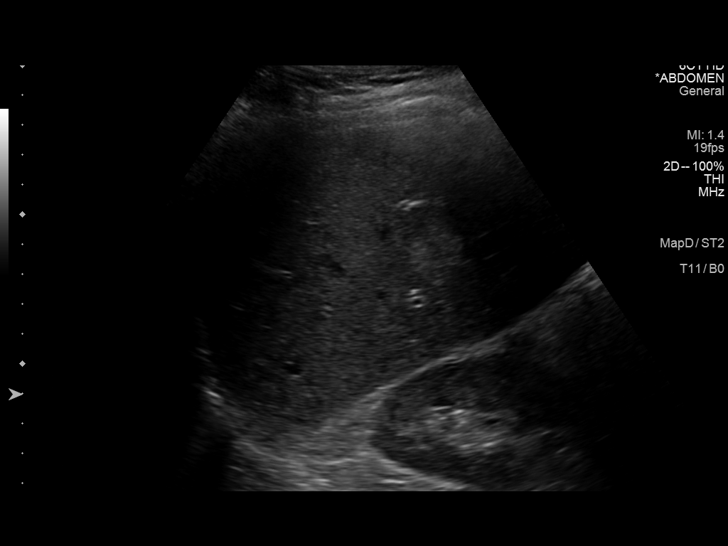
[im 15/40]
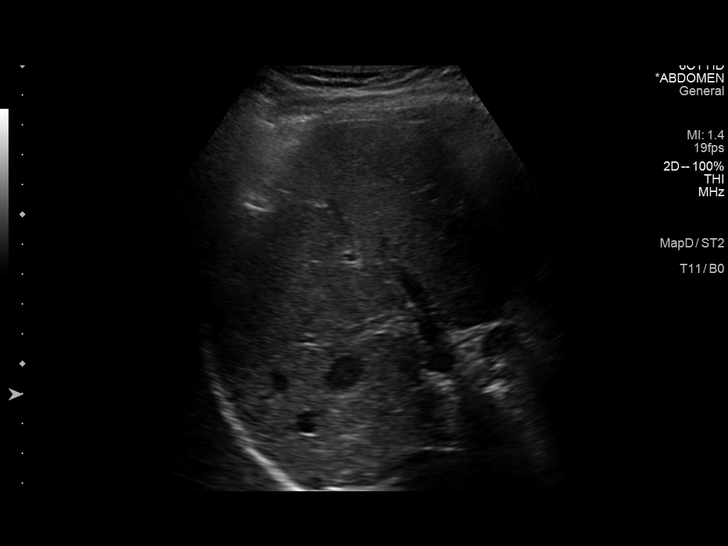
[im 18/40]
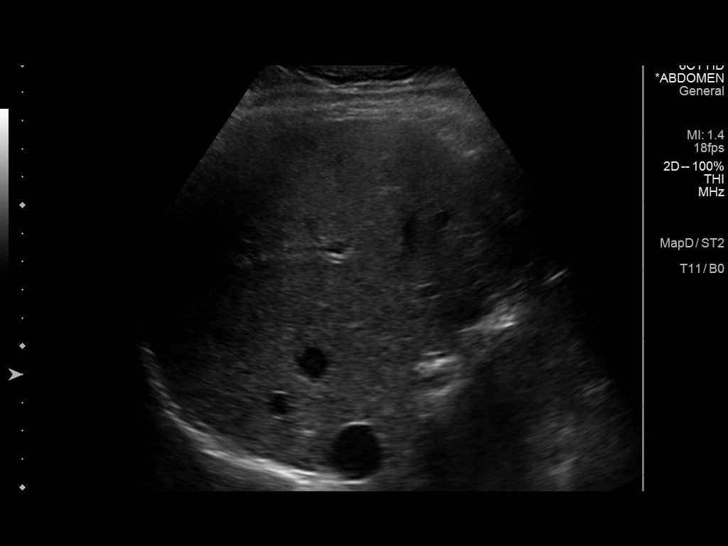
[im 22/40]
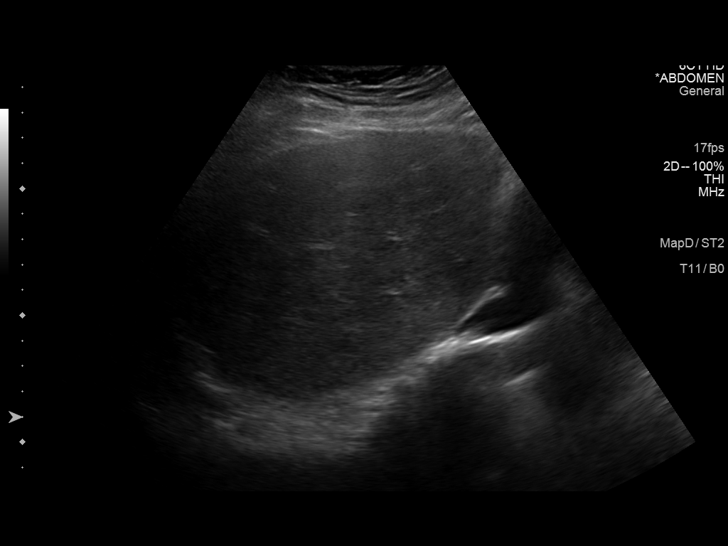
[im 25/40]
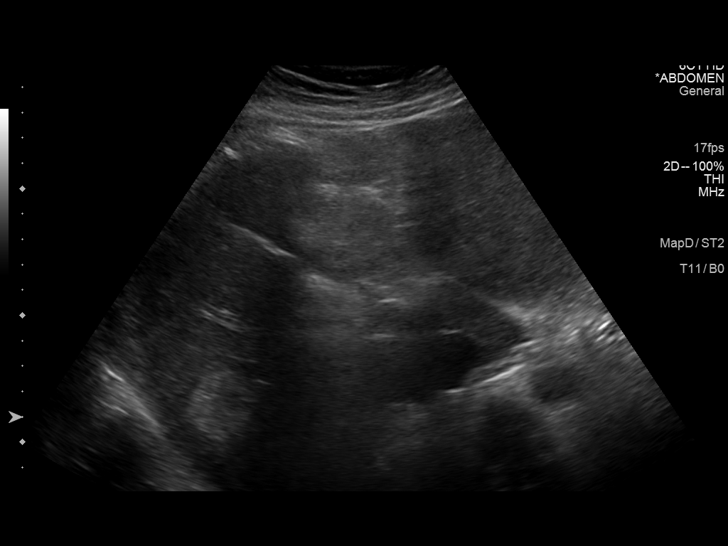
[im 27/40]
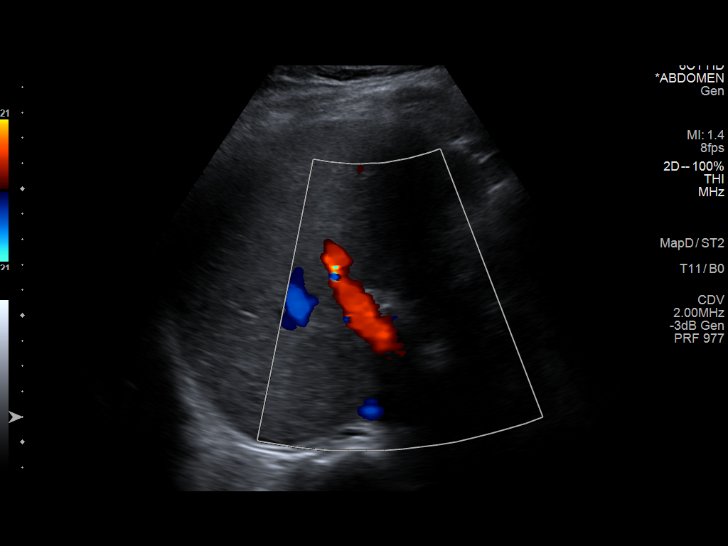
[im 30/40]
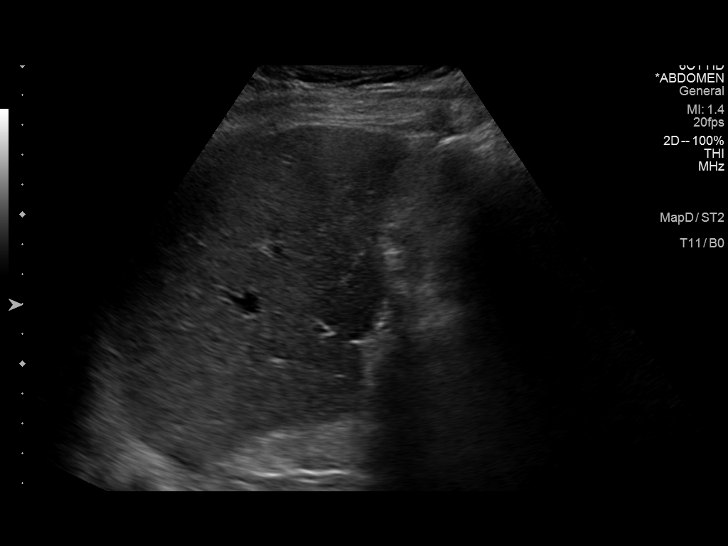
[im 33/40]
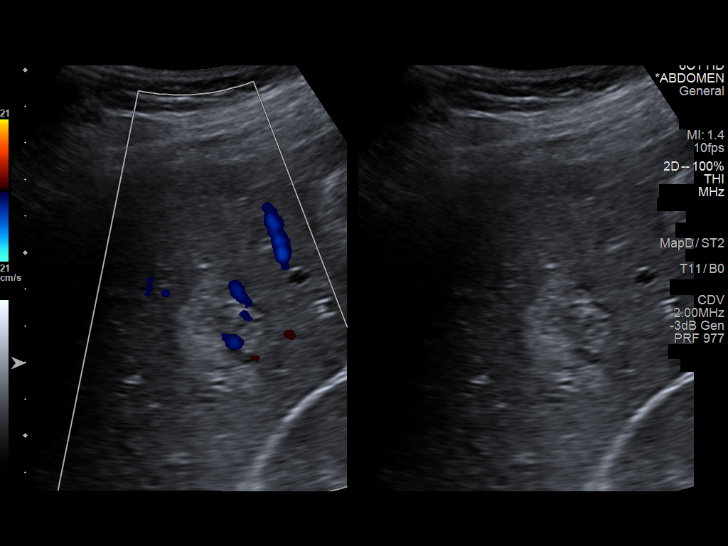
[im 36/40]
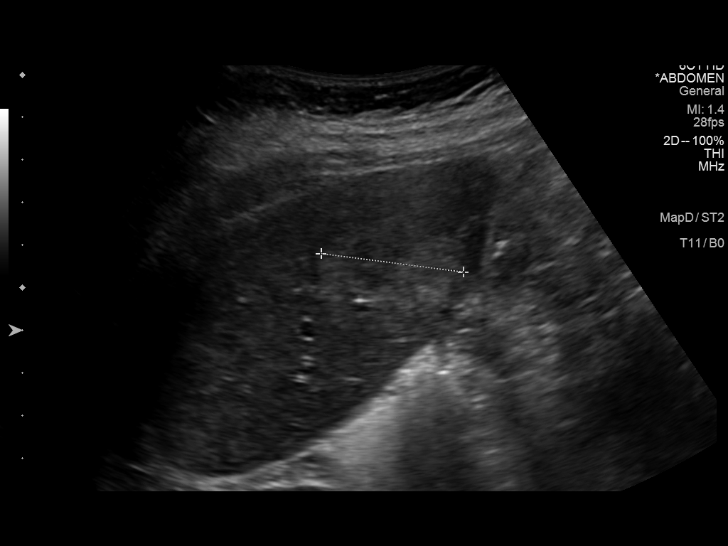
[im 40/40]
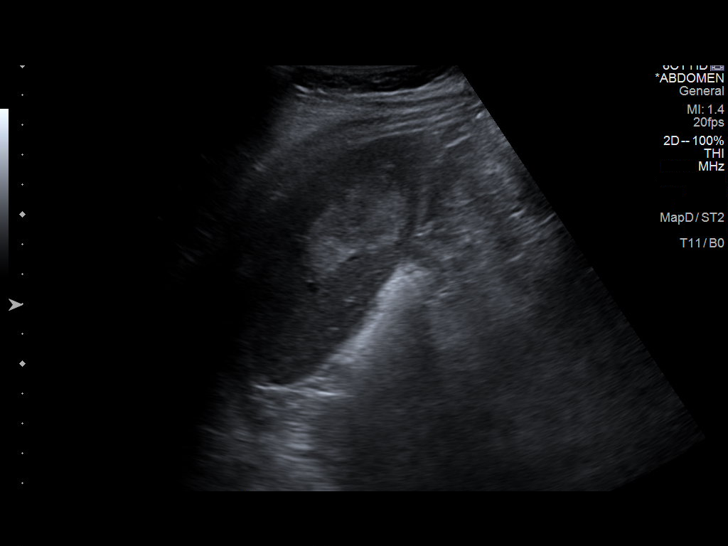

[14 of 25 positions shown; findings below may reference images not displayed]

FINDINGS: Gallbladder:

Surgically absent.

Common bile duct:

Diameter: 3 mm.

Liver:

To predominately hyperechoic but heterogeneous liver lesions are
again identified. They measure 2.6 x 2.2 x 3.6 cm and 4.6 x 2.6 x
3.4 cm. Previously, they measured 3.8 x 3.0 x 4.1 cm and 3.4 x 3.1 x
3.0 cm. There are also present on a CT dated 02/27/2012 these
lesions demonstrated similar measurements. No new liver lesions via
sonography.
IMPRESSION: There are 2 liver lesions in the right lobe which are not
significantly changed allowing for differences in measuring
technique and allowing for comparison to a prior CT dated
02/27/2012.

## 2015-07-03 ENCOUNTER — Encounter (HOSPITAL_COMMUNITY)
Admission: RE | Disposition: A | Discharge status: Home / Self Care | Payer: Self-pay | Source: Ambulatory Visit | Attending: Obstetrics

## 2015-07-03 ENCOUNTER — Ambulatory Visit (HOSPITAL_COMMUNITY)
Admission: RE | Admit: 2015-07-03 | Discharge: 2015-07-03 | Disposition: A | Discharge status: Home / Self Care | Payer: BLUE CROSS/BLUE SHIELD | Source: Ambulatory Visit | Attending: Obstetrics | Admitting: Obstetrics

## 2015-07-03 ENCOUNTER — Other Ambulatory Visit: Payer: Self-pay | Admitting: Obstetrics

## 2015-07-03 ENCOUNTER — Ambulatory Visit (HOSPITAL_COMMUNITY): Payer: BLUE CROSS/BLUE SHIELD | Admitting: Anesthesiology

## 2015-07-03 DIAGNOSIS — N946 Dysmenorrhea, unspecified: Secondary | ICD-10-CM | POA: Diagnosis not present

## 2015-07-03 DIAGNOSIS — Z87442 Personal history of urinary calculi: Secondary | ICD-10-CM | POA: Diagnosis not present

## 2015-07-03 DIAGNOSIS — I10 Essential (primary) hypertension: Secondary | ICD-10-CM | POA: Insufficient documentation

## 2015-07-03 DIAGNOSIS — E785 Hyperlipidemia, unspecified: Secondary | ICD-10-CM | POA: Insufficient documentation

## 2015-07-03 DIAGNOSIS — N939 Abnormal uterine and vaginal bleeding, unspecified: Secondary | ICD-10-CM | POA: Diagnosis not present

## 2015-07-03 DIAGNOSIS — E282 Polycystic ovarian syndrome: Secondary | ICD-10-CM | POA: Diagnosis not present

## 2015-07-03 DIAGNOSIS — F17210 Nicotine dependence, cigarettes, uncomplicated: Secondary | ICD-10-CM | POA: Insufficient documentation

## 2015-07-03 DIAGNOSIS — G8918 Other acute postprocedural pain: Secondary | ICD-10-CM

## 2015-07-03 DIAGNOSIS — G40909 Epilepsy, unspecified, not intractable, without status epilepticus: Secondary | ICD-10-CM | POA: Insufficient documentation

## 2015-07-03 DIAGNOSIS — K219 Gastro-esophageal reflux disease without esophagitis: Secondary | ICD-10-CM | POA: Diagnosis not present

## 2015-07-03 HISTORY — PX: DILITATION & CURRETTAGE/HYSTROSCOPY WITH HYDROTHERMAL ABLATION: SHX5570

## 2015-07-03 LAB — PREGNANCY, URINE: Preg Test, Ur: NEGATIVE

## 2015-07-03 SURGERY — DILATATION & CURETTAGE/HYSTEROSCOPY WITH HYDROTHERMAL ABLATION
Anesthesia: General | Site: Vagina

## 2015-07-03 MED ORDER — FENTANYL CITRATE (PF) 100 MCG/2ML IJ SOLN
25.0000 ug | INTRAMUSCULAR | Status: DC | PRN
  Administered 2015-07-03 (×2): 50 ug via INTRAVENOUS

## 2015-07-03 MED ORDER — ONDANSETRON HCL 4 MG/2ML IJ SOLN
INTRAMUSCULAR | Status: DC | PRN
  Administered 2015-07-03: 4 mg via INTRAVENOUS

## 2015-07-03 MED ORDER — SCOPOLAMINE 1 MG/3DAYS TD PT72
MEDICATED_PATCH | TRANSDERMAL | Status: AC
  Administered 2015-07-03: 1.5 mg via TRANSDERMAL
  Filled 2015-07-03: qty 1

## 2015-07-03 MED ORDER — SODIUM CHLORIDE 0.9 % IR SOLN
Status: DC | PRN
  Administered 2015-07-03: 3000 mL

## 2015-07-03 MED ORDER — SILVER NITRATE-POT NITRATE 75-25 % EX MISC
CUTANEOUS | Status: AC
  Filled 2015-07-03: qty 1

## 2015-07-03 MED ORDER — IBUPROFEN 800 MG PO TABS: 800.0000 mg | ORAL_TABLET | Freq: Three times a day (TID) | ORAL | Status: DC | PRN

## 2015-07-03 MED ORDER — LIDOCAINE HCL (CARDIAC) 20 MG/ML IV SOLN
INTRAVENOUS | Status: AC
  Filled 2015-07-03: qty 5

## 2015-07-03 MED ORDER — SILVER NITRATE-POT NITRATE 75-25 % EX MISC
CUTANEOUS | Status: DC | PRN
  Administered 2015-07-03: 2 via TOPICAL

## 2015-07-03 MED ORDER — SCOPOLAMINE 1 MG/3DAYS TD PT72
1.0000 | MEDICATED_PATCH | Freq: Once | TRANSDERMAL | Status: DC
  Administered 2015-07-03: 1.5 mg via TRANSDERMAL

## 2015-07-03 MED ORDER — ACETAMINOPHEN 10 MG/ML IV SOLN
1000.0000 mg | Freq: Once | INTRAVENOUS | Status: AC
  Administered 2015-07-03: 1000 mg via INTRAVENOUS
  Filled 2015-07-03: qty 100

## 2015-07-03 MED ORDER — PROPOFOL 10 MG/ML IV BOLUS
INTRAVENOUS | Status: DC | PRN
  Administered 2015-07-03: 200 mg via INTRAVENOUS

## 2015-07-03 MED ORDER — LIDOCAINE HCL (CARDIAC) 20 MG/ML IV SOLN
INTRAVENOUS | Status: DC | PRN
  Administered 2015-07-03: 100 mg via INTRAVENOUS

## 2015-07-03 MED ORDER — LIDOCAINE HCL 1 % IJ SOLN
INTRAMUSCULAR | Status: AC
  Filled 2015-07-03: qty 20

## 2015-07-03 MED ORDER — SODIUM CHLORIDE 0.9% FLUSH: 3.0000 mL | INTRAVENOUS | Status: DC | PRN

## 2015-07-03 MED ORDER — HYDROCODONE-ACETAMINOPHEN 10-325 MG PO TABS: 1.0000 | ORAL_TABLET | Freq: Four times a day (QID) | ORAL | Status: DC | PRN

## 2015-07-03 MED ORDER — LIDOCAINE HCL 1 % IJ SOLN
INTRAMUSCULAR | Status: DC | PRN
  Administered 2015-07-03: 20 mL

## 2015-07-03 MED ORDER — FENTANYL CITRATE (PF) 100 MCG/2ML IJ SOLN: 25.0000 ug | INTRAMUSCULAR | Status: DC | PRN

## 2015-07-03 MED ORDER — OXYCODONE-ACETAMINOPHEN 10-325 MG PO TABS: 1.0000 | ORAL_TABLET | ORAL | Status: DC | PRN

## 2015-07-03 MED ORDER — ACETAMINOPHEN 325 MG PO TABS: 650.0000 mg | ORAL_TABLET | ORAL | Status: DC | PRN

## 2015-07-03 MED ORDER — MIDAZOLAM HCL 2 MG/2ML IJ SOLN
INTRAMUSCULAR | Status: DC | PRN
  Administered 2015-07-03: 1 mg via INTRAVENOUS

## 2015-07-03 MED ORDER — PROPOFOL 10 MG/ML IV BOLUS
INTRAVENOUS | Status: AC
  Filled 2015-07-03: qty 20

## 2015-07-03 MED ORDER — ONDANSETRON HCL 4 MG/2ML IJ SOLN
INTRAMUSCULAR | Status: AC
  Filled 2015-07-03: qty 2

## 2015-07-03 MED ORDER — MIDAZOLAM HCL 2 MG/2ML IJ SOLN
INTRAMUSCULAR | Status: AC
  Filled 2015-07-03: qty 2

## 2015-07-03 MED ORDER — DEXAMETHASONE SODIUM PHOSPHATE 10 MG/ML IJ SOLN
INTRAMUSCULAR | Status: DC | PRN
  Administered 2015-07-03: 4 mg via INTRAVENOUS

## 2015-07-03 MED ORDER — OXYCODONE HCL 5 MG PO TABS: 5.0000 mg | ORAL_TABLET | ORAL | Status: DC | PRN

## 2015-07-03 MED ORDER — LACTATED RINGERS IV SOLN
INTRAVENOUS | Status: DC
  Administered 2015-07-03 (×2): via INTRAVENOUS

## 2015-07-03 MED ORDER — FENTANYL CITRATE (PF) 100 MCG/2ML IJ SOLN
INTRAMUSCULAR | Status: AC
  Filled 2015-07-03: qty 2

## 2015-07-03 MED ORDER — KETOROLAC TROMETHAMINE 30 MG/ML IJ SOLN
INTRAMUSCULAR | Status: AC
  Filled 2015-07-03: qty 1

## 2015-07-03 MED ORDER — ONDANSETRON HCL 4 MG/2ML IJ SOLN: 4.0000 mg | Freq: Once | INTRAMUSCULAR | Status: DC | PRN

## 2015-07-03 MED ORDER — SODIUM CHLORIDE 0.9 % IV SOLN: 250.0000 mL | INTRAVENOUS | Status: DC | PRN

## 2015-07-03 MED ORDER — SODIUM CHLORIDE 0.9% FLUSH: 3.0000 mL | Freq: Two times a day (BID) | INTRAVENOUS | Status: DC

## 2015-07-03 MED ORDER — KETOROLAC TROMETHAMINE 30 MG/ML IJ SOLN
INTRAMUSCULAR | Status: DC | PRN
  Administered 2015-07-03: 30 mg via INTRAVENOUS

## 2015-07-03 MED ORDER — ACETAMINOPHEN 650 MG RE SUPP
650.0000 mg | RECTAL | Status: DC | PRN
  Filled 2015-07-03: qty 1

## 2015-07-03 MED ORDER — PHENYLEPHRINE HCL 10 MG/ML IJ SOLN
INTRAMUSCULAR | Status: DC | PRN
  Administered 2015-07-03 (×2): 80 ug via INTRAVENOUS
  Administered 2015-07-03: 40 ug via INTRAVENOUS

## 2015-07-03 MED ORDER — FENTANYL CITRATE (PF) 100 MCG/2ML IJ SOLN
INTRAMUSCULAR | Status: DC | PRN
  Administered 2015-07-03 (×2): 50 ug via INTRAVENOUS

## 2015-07-03 MED ORDER — FENTANYL CITRATE (PF) 250 MCG/5ML IJ SOLN
INTRAMUSCULAR | Status: AC
  Filled 2015-07-03: qty 5

## 2015-07-03 MED ORDER — DEXAMETHASONE SODIUM PHOSPHATE 4 MG/ML IJ SOLN
INTRAMUSCULAR | Status: AC
  Filled 2015-07-03: qty 1

## 2015-07-03 MED ORDER — PHENYLEPHRINE 40 MCG/ML (10ML) SYRINGE FOR IV PUSH (FOR BLOOD PRESSURE SUPPORT)
PREFILLED_SYRINGE | INTRAVENOUS | Status: AC
  Filled 2015-07-03: qty 10

## 2015-07-03 SURGICAL SUPPLY — 18 items
CATH FOLEY LATEX FREE 14FR (CATHETERS) ×2
CATH FOLEY LF 14FR (CATHETERS) IMPLANT
CATH ROBINSON RED A/P 16FR (CATHETERS) ×1 IMPLANT
CLOTH BEACON ORANGE TIMEOUT ST (SAFETY) ×2 IMPLANT
CONTAINER PREFILL 10% NBF 60ML (FORM) ×3 IMPLANT
ELECT REM PT RETURN 9FT ADLT (ELECTROSURGICAL)
ELECTRODE REM PT RTRN 9FT ADLT (ELECTROSURGICAL) IMPLANT
GLOVE BIO SURGEON STRL SZ8 (GLOVE) ×4 IMPLANT
GLOVE BIOGEL PI IND STRL 7.0 (GLOVE) ×1 IMPLANT
GLOVE BIOGEL PI INDICATOR 7.0 (GLOVE) ×1
GOWN STRL REUS W/TWL LRG LVL3 (GOWN DISPOSABLE) ×4 IMPLANT
NEEDLE HYPO 22GX1.5 SAFETY (NEEDLE) ×2 IMPLANT
NS IRRIG 1000ML POUR BTL (IV SOLUTION) ×1 IMPLANT
PACK VAGINAL MINOR WOMEN LF (CUSTOM PROCEDURE TRAY) ×2 IMPLANT
PAD OB MATERNITY 4.3X12.25 (PERSONAL CARE ITEMS) ×2 IMPLANT
SET GENESYS HTA PROCERVA (MISCELLANEOUS) ×1 IMPLANT
TOWEL OR 17X24 6PK STRL BLUE (TOWEL DISPOSABLE) ×4 IMPLANT
WATER STERILE IRR 1000ML POUR (IV SOLUTION) ×2 IMPLANT

## 2015-07-03 NOTE — H&P (Signed)
Chelsea Haley is an 40 y.o. female. H/O heavy and painful periods.  Pertinent Gynecological History: Menses: flow is excessive with use of several pads or tampons on heaviest days Bleeding: dysfunctional uterine bleeding Contraception: OCP (estrogen/progesterone) DES exposure: denies Blood transfusions: none Sexually transmitted diseases: no past history Previous GYN Procedures: Laparoscopy  Last mammogram: n/a Date: n/a Last pap: normal Date: 2016 OB History: G2, P2   Menstrual History: Menarche age: 61  Patient's last menstrual period was 04/22/2015.    Past Medical History  Diagnosis Date  . History of chicken pox   . Depression   . Migraines   . History of kidney stones   . History of seizure disorder     as a child. No medication since age 68.  Marland Kitchen Hyperlipidemia   . Hypertension   . Tachycardia   . History of frequent urinary tract infections   . GERD (gastroesophageal reflux disease)   . Binge eating   . Endometriosis   . Allergy   . Dysrhythmia   . Seizures ([place])     last one age 22, continues to have abnormal EEG's and MRI's  . Liver hemangioma     at least 2  . PCOS (polycystic ovarian syndrome)   . PONV (postoperative nausea and vomiting)   . Auditory processing disorder     Past Surgical History  Procedure Laterality Date  . Cholecystectomy  2006  . Cesarean section  2002  . Pilonidal cyst excision  2003  . Laporoscopy    . Wisdom tooth extraction Bilateral   . Cesarean section N/A 02/07/2014    Procedure: CESAREAN SECTION;  Surgeon: Shelly Bombard, MD;  Location: [facility];  Service: Obstetrics;  Laterality: N/A;    Family History  Problem Relation Age of Onset  . Arthritis Father   . Hypertension Father   . Diabetes Father   . Heart disease Maternal Grandmother     ICD  . Cancer Maternal Grandfather     lung    Social History:  reports that she has been smoking Cigarettes.  She has a 10 pack-year smoking history. She has never used  smokeless tobacco. She reports that she drinks alcohol. She reports that she does not use illicit drugs.  Allergies:  Allergies  Allergen Reactions  . Adhesive [Tape]     Skin sensitivity  . Banana     Migraine, if they are cooked or over ripe.   . Morphine And Related Itching    hallucinations  . Other Itching    WALNUTS.  Gums itch. Cashews.   . Pertussis Vaccines     seizures  . Tramadol Anxiety    Prescriptions prior to admission  Medication Sig Dispense Refill Last Dose  . metoprolol succinate (TOPROL-XL) 50 MG 24 hr tablet Take 1 tablet (50 mg total) by mouth daily. Take with or immediately following a meal. 90 tablet 3 Taking  . norethindrone-ethinyl estradiol 1/35 ([place] 1/35, 28,) tablet Patient is to take continuous active pills for 3 months- then have a cycle. 4 Package 4 Taking  . triamterene-hydrochlorothiazide (DYAZIDE) 37.5-25 MG capsule Take 1 each (1 capsule total) by mouth daily. 30 capsule 11 Taking    Review of Systems  All other systems reviewed and are negative.   Last menstrual period 04/22/2015, not currently breastfeeding. Physical Exam  Nursing note and vitals reviewed.   No results found for this or any previous visit (from the past 24 hour(s)).  No results found.  Assessment/Plan: AUB.  Plan Hysteroscopy, D&C, Endometrial Ablation.  HARPER,CHARLES A 07/03/2015, 7:08 AM

## 2015-07-03 NOTE — Discharge Instructions (Signed)
DISCHARGE INSTRUCTIONS: HYSTEROSCOPY / ENDOMETRIAL ABLATION The following instructions have been prepared to help you care for yourself upon your return home.  May Remove Scop patch on or before 07/06/15.    May take Ibuprofen after 4:00 pm 07/03/15  Pick up pain medication prescription from Dr Jacelyn Grip office when you leave the hospital.  May take stool softner while taking narcotic pain medication to prevent constipation.  Drink plenty of water.  Personal hygiene:  Use sanitary pads for vaginal drainage, not tampons.  Shower the day after your procedure.  NO tub baths, pools or Jacuzzis for 2-3 weeks.  Wipe front to back after using the bathroom.  Activity and limitations:  Do NOT drive or operate any equipment for 24 hours. The effects of anesthesia are still present and drowsiness may result.  Do NOT rest in bed all day.  Walking is encouraged.  Walk up and down stairs slowly.  You may resume your normal activity in one to two days or as indicated by your physician. Sexual activity: NO intercourse for at least 2 weeks after the procedure, or as indicated by your Doctor.  Diet: Eat a light meal as desired this evening. You may resume your usual diet tomorrow.  Return to Work: You may resume your work activities in one to two days or as indicated by Marine scientist.  What to expect after your surgery: Expect to have vaginal bleeding/discharge for 2-3 days and spotting for up to 10 days. It is not unusual to have soreness for up to 1-2 weeks. You may have a slight burning sensation when you urinate for the first day. Mild cramps may continue for a couple of days. You may have a regular period in 2-6 weeks.  Call your doctor for any of the following:  Excessive vaginal bleeding or clotting, saturating and changing one pad every hour.  Inability to urinate 6 hours after discharge from hospital.  Pain not relieved by pain medication.  Fever of 100.4 F or greater.   Unusual vaginal discharge or odor.  Return to office _____2 weeks___Call for an appointment [phone] Patients signature: ______________________ Nurses signature ________________________  [facility] [phone]

## 2015-07-03 NOTE — Transfer of Care (Signed)
Immediate Anesthesia Transfer of Care Note  Patient: Chelsea Haley  Procedure(s) Performed: Procedure(s): DILATATION & CURETTAGE/HYSTEROSCOPY WITH HYDROTHERMAL ABLATION (N/A)  Patient Location: PACU  Anesthesia Type:General  Level of Consciousness: awake, alert  and oriented  Airway & Oxygen Therapy: Patient Spontanous Breathing and Patient connected to nasal cannula oxygen  Post-op Assessment: Report given to RN, Post -op Vital signs reviewed and stable and Patient moving all extremities  Post vital signs: Reviewed and stable  Last Vitals:  Filed Vitals:   07/03/15 0705  BP: 132/101  Pulse: 87  Temp: 36.7 C  Resp: 20    Complications: No apparent anesthesia complications

## 2015-07-03 NOTE — Anesthesia Preprocedure Evaluation (Signed)
Anesthesia Evaluation  Patient identified by MRN, date of birth, ID band Patient awake    Reviewed: Allergy & Precautions, NPO status , Patient's Chart, lab work & pertinent test results  Airway Mallampati: II  TM Distance: >3 FB Neck ROM: Full    Dental  (+) Teeth Intact, Dental Advisory Given   Pulmonary Current Smoker,    breath sounds clear to auscultation       Cardiovascular hypertension,  Rhythm:Regular Rate:Normal     Neuro/Psych    GI/Hepatic   Endo/Other    Renal/GU      Musculoskeletal   Abdominal   Peds  Hematology   Anesthesia Other Findings   Reproductive/Obstetrics                             Anesthesia Physical Anesthesia Plan  ASA: II  Anesthesia Plan: General   Post-op Pain Management:    Induction: Intravenous  Airway Management Planned: LMA  Additional Equipment:   Intra-op Plan:   Post-operative Plan:   Informed Consent: I have reviewed the patients History and Physical, chart, labs and discussed the procedure including the risks, benefits and alternatives for the proposed anesthesia with the patient or authorized representative who has indicated his/her understanding and acceptance.   Dental advisory given  Plan Discussed with: Anesthesiologist and CRNA  Anesthesia Plan Comments:         Anesthesia Quick Evaluation

## 2015-07-03 NOTE — Op Note (Addendum)
Preop Diagnosis: Abnormal Uterine Bleeding   Postop Diagnosis: Abnormal Uterine Bleeding   Procedure: DILATATION & CURETTAGE/HYSTEROSCOPY WITH HYDROTHERMAL ABLATION   Anesthesia: General   Anesthesiologist: Roberts Gaudy MD  Attending: Shelly Bombard, MD   Assistant: None  Findings:  Normal uterine cavity  Pathology: Endometrial curettings  Fluids:  1010ml  UOP:  240ml  EBL:  [id number]  Complications:  None  Procedure: The patient was taken to the operating room after risks benefits and alternatives were discussed with patient, the patient verbalized understanding and consent signed and witnessed. The patient was placed under general anesthesia and prepped and draped in normal sterile fashion. A bivalve speculum was placed in the patient's vagina and the anterior lip of the cervix was grasped with a single-tooth tenaculum. The cervix was dilated for passage of the hysteroscope. The uterus sounded to 8.  The hysteroscope was introduced into the uterine cavity with findings as noted above. A curettage was performed and currettings sent to pathology. The hysteroscope was reintroduced and hydrothermal ablation was performed without difficulty. The tenaculum and bivalve speculum were removed and there was good hemostasis at the tenaculum sites. Sponge lap and needle count was correct. The patient tolerated procedure well and was returned to the recovery room in good condition.

## 2015-07-03 NOTE — Anesthesia Postprocedure Evaluation (Signed)
Anesthesia Post Note  Patient: Chelsea Haley  Procedure(s) Performed: Procedure(s) (LRB): DILATATION & CURETTAGE/HYSTEROSCOPY WITH HYDROTHERMAL ABLATION (N/A)  Patient location during evaluation: PACU Anesthesia Type: General Level of consciousness: awake, awake and alert and oriented Pain management: pain level controlled Vital Signs Assessment: post-procedure vital signs reviewed and stable Respiratory status: spontaneous breathing, nonlabored ventilation and respiratory function stable Cardiovascular status: blood pressure returned to baseline Anesthetic complications: no    Last Vitals:  Filed Vitals:   07/03/15 1130 07/03/15 1206  BP: 135/82 149/92  Pulse: 63 63  Temp: 36.7 C 36.9 C  Resp: 19 14    Last Pain:  Filed Vitals:   07/03/15 1219  PainSc: 3                  Halley Kincer COKER

## 2015-07-03 NOTE — Anesthesia Procedure Notes (Signed)
Procedure Name: LMA Insertion Date/Time: 07/03/2015 9:00 AM Performed by: Hewitt Blade Pre-anesthesia Checklist: Patient identified, Suction available, Emergency Drugs available and Patient being monitored Patient Re-evaluated:Patient Re-evaluated prior to inductionOxygen Delivery Method: Circle system utilized Preoxygenation: Pre-oxygenation with 100% oxygen Intubation Type: IV induction LMA: LMA inserted LMA Size: 4.0 Number of attempts: 1 Placement Confirmation: positive ETCO2 and breath sounds checked- equal and bilateral Tube secured with: Tape Dental Injury: Teeth and Oropharynx as per pre-operative assessment

## 2015-07-06 ENCOUNTER — Encounter (HOSPITAL_COMMUNITY): Payer: Self-pay | Admitting: Obstetrics

## 2015-07-20 ENCOUNTER — Ambulatory Visit (INDEPENDENT_AMBULATORY_CARE_PROVIDER_SITE_OTHER): Payer: BLUE CROSS/BLUE SHIELD | Admitting: Obstetrics

## 2015-07-20 ENCOUNTER — Encounter: Payer: Self-pay | Admitting: Obstetrics

## 2015-07-20 VITALS — BP 117/81 | HR 77 | Wt 192.0 lb

## 2015-07-20 DIAGNOSIS — Z9889 Other specified postprocedural states: Secondary | ICD-10-CM | POA: Diagnosis not present

## 2015-07-20 DIAGNOSIS — B3731 Acute candidiasis of vulva and vagina: Secondary | ICD-10-CM

## 2015-07-20 DIAGNOSIS — N939 Abnormal uterine and vaginal bleeding, unspecified: Secondary | ICD-10-CM

## 2015-07-20 DIAGNOSIS — N760 Acute vaginitis: Secondary | ICD-10-CM

## 2015-07-20 DIAGNOSIS — B9689 Other specified bacterial agents as the cause of diseases classified elsewhere: Secondary | ICD-10-CM

## 2015-07-20 DIAGNOSIS — A499 Bacterial infection, unspecified: Secondary | ICD-10-CM

## 2015-07-20 DIAGNOSIS — B373 Candidiasis of vulva and vagina: Secondary | ICD-10-CM

## 2015-07-20 MED ORDER — FLUCONAZOLE 150 MG PO TABS: 150.0000 mg | ORAL_TABLET | Freq: Once | ORAL | Status: DC

## 2015-07-20 MED ORDER — CLINDAMYCIN HCL 300 MG PO CAPS: 300.0000 mg | ORAL_CAPSULE | Freq: Three times a day (TID) | ORAL | Status: DC

## 2015-07-21 ENCOUNTER — Encounter: Payer: Self-pay | Admitting: Obstetrics

## 2015-07-21 NOTE — Progress Notes (Signed)
Patient ID: Chelsea Haley, female   DOB: 1976-01-22, 40 y.o.   MRN: [id number]  Chief Complaint  Patient presents with  . Follow-up    post-op    HPI Chelsea Haley is a 40 y.o. female.  AUB.  S/P Hysteroscopy, D&C and HTA Endometrial Ablation.  2 week check up. No complaints.  HPI  Past Medical History  Diagnosis Date  . History of chicken pox   . Depression   . Migraines   . History of kidney stones   . History of seizure disorder     as a child. No medication since age 82.  Marland Kitchen Hyperlipidemia   . Hypertension   . Tachycardia   . History of frequent urinary tract infections   . GERD (gastroesophageal reflux disease)   . Binge eating   . Endometriosis   . Allergy   . Dysrhythmia   . Seizures ([place])     last one age 65, continues to have abnormal EEG's and MRI's  . Liver hemangioma     at least 2  . PCOS (polycystic ovarian syndrome)   . PONV (postoperative nausea and vomiting)   . Auditory processing disorder     Past Surgical History  Procedure Laterality Date  . Cholecystectomy  2006  . Cesarean section  2002  . Pilonidal cyst excision  2003  . Laporoscopy    . Wisdom tooth extraction Bilateral   . Cesarean section N/A 02/07/2014    Procedure: CESAREAN SECTION;  Surgeon: Shelly Bombard, MD;  Location: [facility];  Service: Obstetrics;  Laterality: N/A;  . Dilitation & currettage/hystroscopy with hydrothermal ablation N/A 07/03/2015    Procedure: DILATATION & CURETTAGE/HYSTEROSCOPY WITH HYDROTHERMAL ABLATION;  Surgeon: Shelly Bombard, MD;  Location: [facility];  Service: Gynecology;  Laterality: N/A;    Family History  Problem Relation Age of Onset  . Arthritis Father   . Hypertension Father   . Diabetes Father   . Heart disease Maternal Grandmother     ICD  . Cancer Maternal Grandfather     lung    Social History Social History  Substance Use Topics  . Smoking status: Current Every Day Smoker -- 0.50 packs/day for 20 years    Types: Cigarettes  . Smokeless  tobacco: Never Used     Comment: 4 cigarettes daily  . Alcohol Use: 0.0 oz/week    0 Standard drinks or equivalent per week     Comment: rare    Allergies  Allergen Reactions  . Adhesive [Tape]     Skin sensitivity  . Banana     Migraine, if they are cooked or over ripe.   . Morphine And Related Itching    hallucinations  . Other Itching    WALNUTS.  Gums itch. Cashews.   . Pertussis Vaccines     seizures  . Tramadol Anxiety    Current Outpatient Prescriptions  Medication Sig Dispense Refill  . ibuprofen (ADVIL,MOTRIN) 800 MG tablet Take 1 tablet (800 mg total) by mouth every 8 (eight) hours as needed. 30 tablet 5  . metoprolol succinate (TOPROL-XL) 50 MG 24 hr tablet Take 1 tablet (50 mg total) by mouth daily. Take with or immediately following a meal. 90 tablet 3  . norethindrone-ethinyl estradiol 1/35 ([place] 1/35, 28,) tablet Patient is to take continuous active pills for 3 months- then have a cycle. 4 Package 4  . triamterene-hydrochlorothiazide (DYAZIDE) 37.5-25 MG capsule Take 1 each (1 capsule total) by mouth daily. 30 capsule 11  .  clindamycin (CLEOCIN) 300 MG capsule Take 1 capsule (300 mg total) by mouth 3 (three) times daily. 21 capsule 0  . fluconazole (DIFLUCAN) 150 MG tablet Take 1 tablet (150 mg total) by mouth once. 1 tablet 2   No current facility-administered medications for this visit.    Review of Systems Review of Systems Constitutional: negative for fatigue and weight loss Respiratory: negative for cough and wheezing Cardiovascular: negative for chest pain, fatigue and palpitations Gastrointestinal: negative for abdominal pain and change in bowel habits Genitourinary:negative Integument/breast: negative for nipple discharge Musculoskeletal:negative for myalgias Neurological: negative for gait problems and tremors Behavioral/Psych: negative for abusive relationship, depression Endocrine: negative for temperature intolerance     Blood pressure  117/81, pulse 77, weight 192 lb (87.091 kg), not currently breastfeeding.  Physical Exam Physical Exam General:   alert  Skin:   no rash or abnormalities  Lungs:   clear to auscultation bilaterally  Heart:   regular rate and rhythm, S1, S2 normal, no murmur, click, rub or gallop  Breasts:   normal without suspicious masses, skin or nipple changes or axillary nodes  Abdomen:  normal findings: no organomegaly, soft, non-tender and no hernia  Pelvis:  External genitalia: normal general appearance Urinary system: urethral meatus normal and bladder without fullness, nontender Vaginal: normal without tenderness, induration or masses Cervix: normal appearance Adnexa: normal bimanual exam Uterus: anteverted and non-tender, normal size      Data Reviewed Pathology  Assessment     AUB.  S/P Hysteroscopy, D&C and HTA Endometrial Ablation.  Doing well.     Plan    F/U 6 months   Orders Placed This Encounter  Procedures  . NuSwab Vaginitis (VG)   Meds ordered this encounter  Medications  . clindamycin (CLEOCIN) 300 MG capsule    Sig: Take 1 capsule (300 mg total) by mouth 3 (three) times daily.    Dispense:  21 capsule    Refill:  0  . fluconazole (DIFLUCAN) 150 MG tablet    Sig: Take 1 tablet (150 mg total) by mouth once.    Dispense:  1 tablet    Refill:  2

## 2015-07-23 LAB — NUSWAB VAGINITIS (VG)
Candida albicans, NAA: NEGATIVE
Candida glabrata, NAA: NEGATIVE
Trich vag by NAA: NEGATIVE

## 2015-08-13 IMAGING — US US FETAL BPP W/O NONSTRESS
1 series · 11 of 11 positions shown · non-contrast
Comparison: none

[Series 1: us fetal bpp w/o nonstress · non-contrast · 11 acquisitions, 11 frames shown]
[im 1/11]
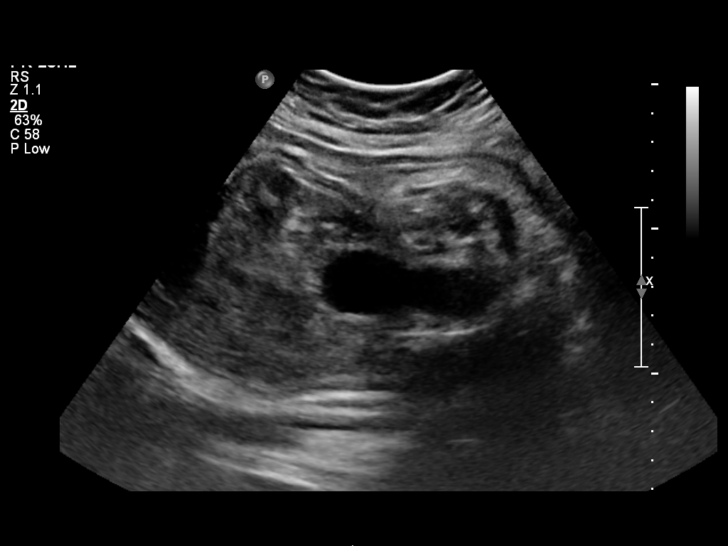
[im 2/11]
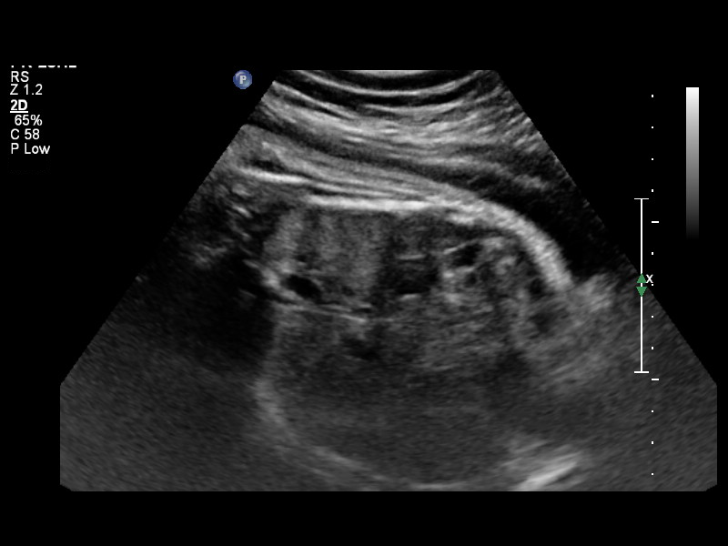
[im 3/11]
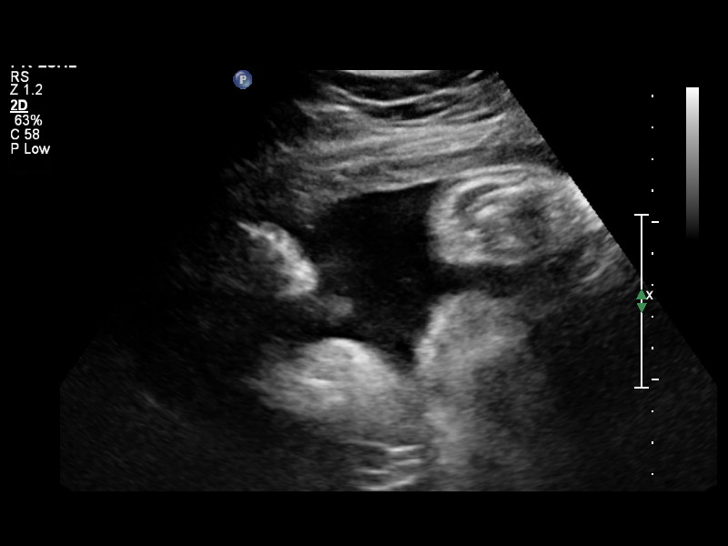
[im 4/11]
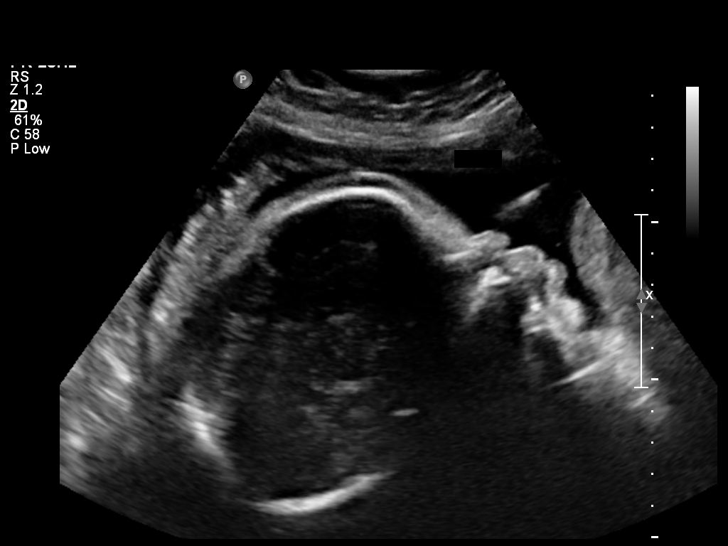
[im 5/11]
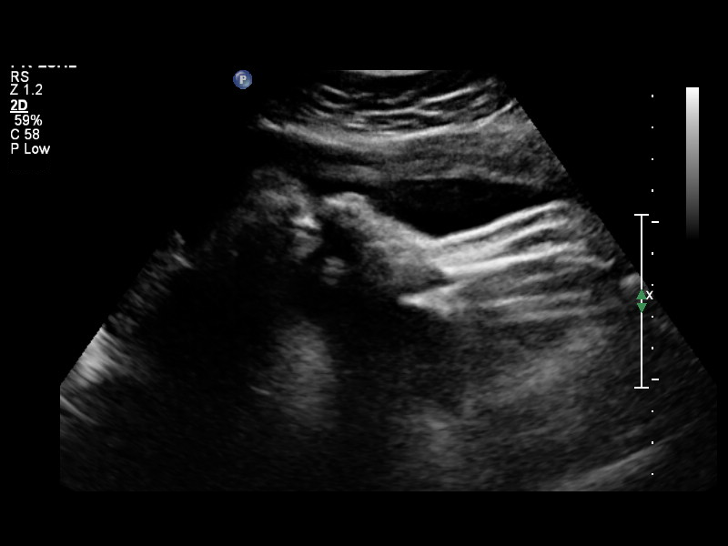
[im 6/11]
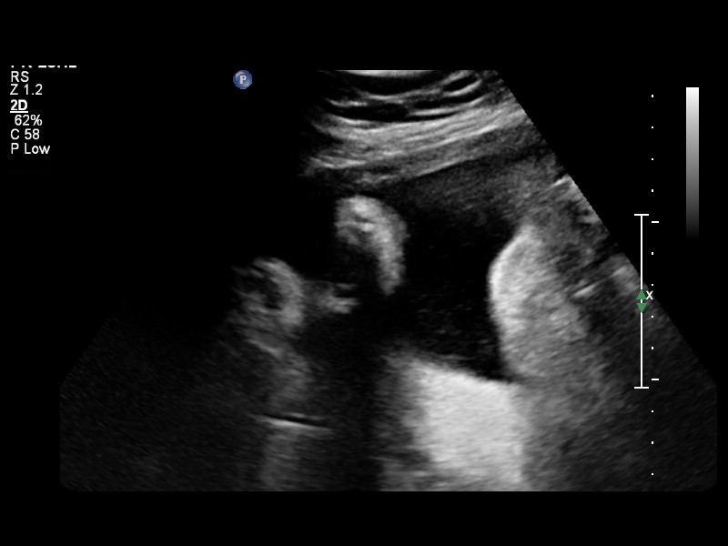
[im 7/11]
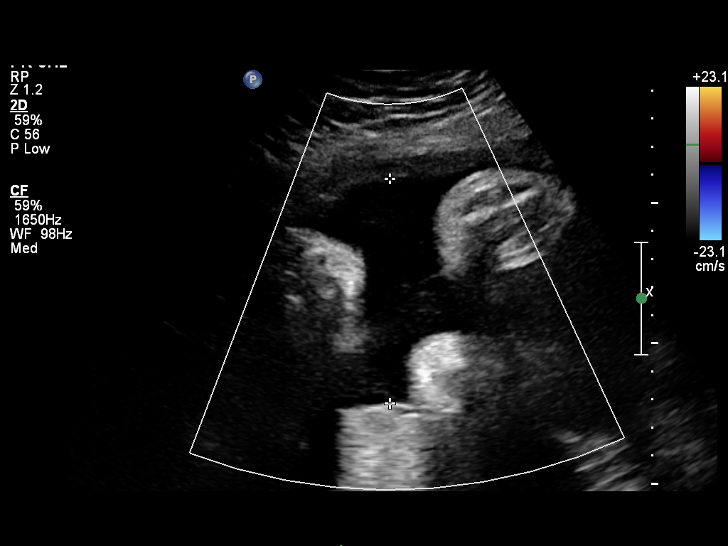
[im 8/11]
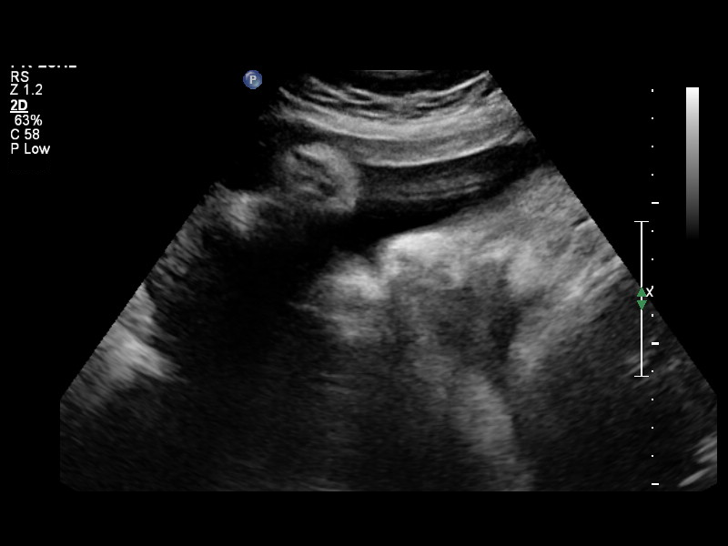
[im 9/11]
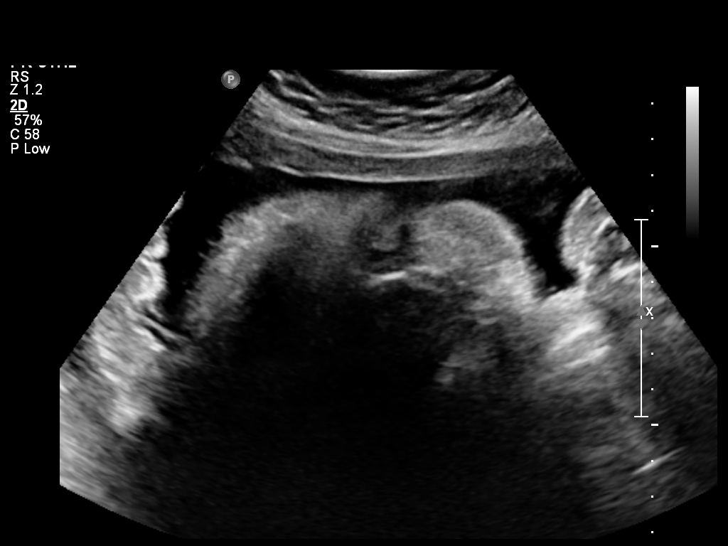
[im 10/11]
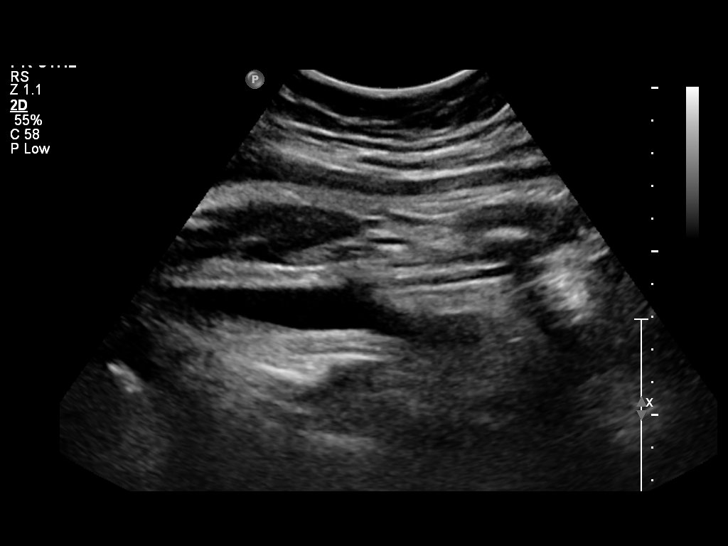
[im 11/11]
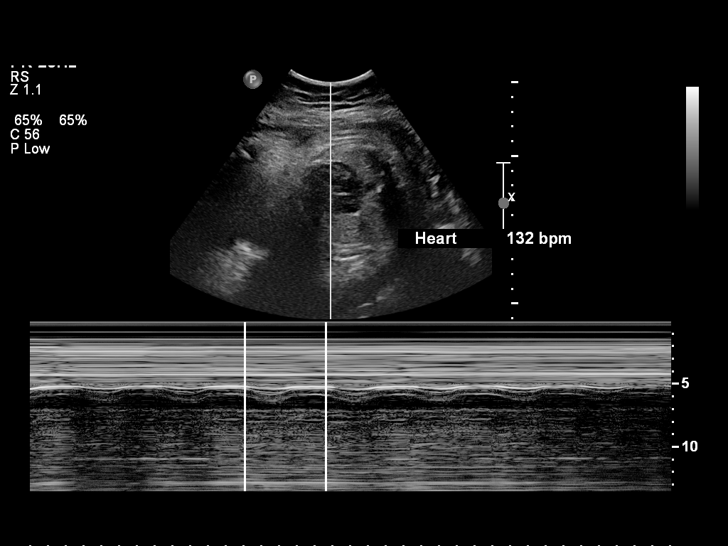

[11 of 11 positions shown; findings below may reference images not displayed]

OBSTETRICS REPORT
                      (Signed Final 02/05/2014 [DATE])

Service(s) Provided

Indications

 Non-reactive NST
 Advanced maternal age multigravida 36+, third
 trimester
 Abnormal biochemical screen (NIPS) for Trisomy
 21 (normal echo)
 Poor obstetric history: Previous preterm delivery
 Cigarette smoker
 Previous cesarean section
 Preterm labor (> 22 weeks)
 36 weeks gestation of pregnancy
 Polyhydramnios
Fetal Evaluation

 Num Of Fetuses:    1
 Fetal Heart Rate:  132                          bpm
 Cardiac Activity:  Observed
 Presentation:      Breech, complete

 Amniotic Fluid
 AFI FV:      Subjectively upper-normal
                                             Larg Pckt:       8  cm
Biophysical Evaluation

 Amniotic F.V:   Pocket => 2 cm two         F. Tone:        Observed
                 planes
 F. Movement:    Observed                   Score:          [DATE]
 F. Breathing:   Observed
Gestational Age

 LMP:           36w 5d        Date:  05/24/13                 EDD:   02/28/14
 Best:          36w 5d     Det. By:  LMP  (05/24/13)          EDD:   02/28/14
Impression

 SIUP at 98w5d for BPP only
 BPP [DATE]
Recommendations

 Continue twice weekly NSTs with weekly AFIs

 questions or concerns.

## 2015-10-15 ENCOUNTER — Ambulatory Visit (INDEPENDENT_AMBULATORY_CARE_PROVIDER_SITE_OTHER): Payer: BLUE CROSS/BLUE SHIELD | Admitting: Obstetrics

## 2015-10-15 VITALS — BP 125/81 | HR 77 | Temp 98.9°F | Wt 173.9 lb

## 2015-10-15 DIAGNOSIS — N939 Abnormal uterine and vaginal bleeding, unspecified: Secondary | ICD-10-CM

## 2015-10-15 DIAGNOSIS — Z9889 Other specified postprocedural states: Secondary | ICD-10-CM | POA: Diagnosis not present

## 2015-10-19 ENCOUNTER — Ambulatory Visit: Payer: BLUE CROSS/BLUE SHIELD | Admitting: Obstetrics

## 2015-10-21 ENCOUNTER — Encounter: Payer: Self-pay | Admitting: Obstetrics

## 2015-10-21 NOTE — Progress Notes (Signed)
Patient ID: Chelsea Haley, female   DOB: [birth year], 39 y.o.   MRN: [id number]  Chief Complaint  Patient presents with  . Follow-up    HPI Chelsea Haley is a 40 y.o. female.  H/O AUB.  S/P HTA Endometrial Ablation.  Presents for 3 month F/U.  No complaints. HPI  Past Medical History:  Diagnosis Date  . Allergy   . Auditory processing disorder   . Binge eating   . Depression   . Dysrhythmia   . Endometriosis   . GERD (gastroesophageal reflux disease)   . History of chicken pox   . History of frequent urinary tract infections   . History of kidney stones   . History of seizure disorder    as a child. No medication since age 57.  Marland Kitchen Hyperlipidemia   . Hypertension   . Liver hemangioma    at least 2  . Migraines   . PCOS (polycystic ovarian syndrome)   . PONV (postoperative nausea and vomiting)   . Seizures ([place])    last one age 30, continues to have abnormal EEG's and MRI's  . Tachycardia     Past Surgical History:  Procedure Laterality Date  . CESAREAN SECTION  2002  . CESAREAN SECTION N/A 02/07/2014   Procedure: CESAREAN SECTION;  Surgeon: Shelly Bombard, MD;  Location: [facility];  Service: Obstetrics;  Laterality: N/A;  . CHOLECYSTECTOMY  2006  . DILITATION & CURRETTAGE/HYSTROSCOPY WITH HYDROTHERMAL ABLATION N/A 07/03/2015   Procedure: DILATATION & CURETTAGE/HYSTEROSCOPY WITH HYDROTHERMAL ABLATION;  Surgeon: Shelly Bombard, MD;  Location: [facility];  Service: Gynecology;  Laterality: N/A;  . Laporoscopy    . PILONIDAL CYST EXCISION  2003  . WISDOM TOOTH EXTRACTION Bilateral     Family History  Problem Relation Age of Onset  . Arthritis Father   . Hypertension Father   . Diabetes Father   . Heart disease Maternal Grandmother     ICD  . Cancer Maternal Grandfather     lung    Social History Social History  Substance Use Topics  . Smoking status: Current Every Day Smoker    Packs/day: 0.50    Years: 20.00    Types: Cigarettes  . Smokeless tobacco: Never Used      Comment: 4 cigarettes daily  . Alcohol use 0.0 oz/week     Comment: rare    Allergies  Allergen Reactions  . Adhesive [Tape]     Skin sensitivity  . Banana     Migraine, if they are cooked or over ripe.   . Morphine And Related Itching    hallucinations  . Other Itching    WALNUTS.  Gums itch. Cashews.   . Pertussis Vaccines     seizures  . Tramadol Anxiety    Current Outpatient Prescriptions  Medication Sig Dispense Refill  . metoprolol succinate (TOPROL-XL) 50 MG 24 hr tablet Take 1 tablet (50 mg total) by mouth daily. Take with or immediately following a meal. 90 tablet 3  . norethindrone-ethinyl estradiol 1/35 ([place] 1/35, 28,) tablet Patient is to take continuous active pills for 3 months- then have a cycle. 4 Package 4  . triamterene-hydrochlorothiazide (DYAZIDE) 37.5-25 MG capsule Take 1 each (1 capsule total) by mouth daily. 30 capsule 11   No current facility-administered medications for this visit.     Review of Systems Review of Systems Constitutional: negative for fatigue and weight loss Respiratory: negative for cough and wheezing Cardiovascular: negative for chest pain, fatigue and palpitations  Gastrointestinal: negative for abdominal pain and change in bowel habits Genitourinary:negative Integument/breast: negative for nipple discharge Musculoskeletal:negative for myalgias Neurological: negative for gait problems and tremors Behavioral/Psych: negative for abusive relationship, depression Endocrine: negative for temperature intolerance     Blood pressure 125/81, pulse 77, temperature 98.9 F (37.2 C), temperature source Oral, weight 173 lb 14.4 oz (78.9 kg), last menstrual period 07/03/2015, not currently breastfeeding.  Physical Exam Physical Exam            General:  Alert and no distress. Abdomen:  normal findings: no organomegaly, soft, non-tender and no hernia  Pelvis:  External genitalia: normal general appearance Urinary system:  urethral meatus normal and bladder without fullness, nontender Vaginal: normal without tenderness, induration or masses Cervix: normal appearance Adnexa: normal bimanual exam Uterus: anteverted and non-tender, normal size      Data Reviewed Labs  Assessment     AUB.  S/P HTA Endometrial Ablation.  Doing well.    Plan    F/U 6 months   No orders of the defined types were placed in this encounter.  No orders of the defined types were placed in this encounter.

## 2015-12-22 ENCOUNTER — Other Ambulatory Visit: Payer: Self-pay | Admitting: Obstetrics

## 2015-12-22 ENCOUNTER — Telehealth: Payer: Self-pay | Admitting: Cardiology

## 2015-12-22 DIAGNOSIS — R002 Palpitations: Secondary | ICD-10-CM

## 2015-12-22 MED ORDER — METOPROLOL SUCCINATE ER 50 MG PO TB24: 50.0000 mg | ORAL_TABLET | Freq: Every day | ORAL | 1 refills | Status: DC

## 2015-12-22 NOTE — Telephone Encounter (Signed)
New message    Patient calling      *STAT* If patient is at the pharmacy, call can be transferred to refill team.   1. Which medications need to be refilled? (please list name of each medication and dose if known) metoprolol succinate (TOPROL-XL) 50 MG 24 hr tablet  2. Which pharmacy/location (including street and city if local pharmacy) is medication to be sent to? [facility] on [address]   3. Do they need a 30 day or 90 day supply? 90 days

## 2015-12-22 NOTE — Telephone Encounter (Signed)
Sent refill to pharmacy for 30days and 1 refill-pt requesting 90days, denied, pt overdue for appt

## 2016-01-14 ENCOUNTER — Ambulatory Visit: Payer: Self-pay | Admitting: Obstetrics

## 2016-01-20 ENCOUNTER — Ambulatory Visit (INDEPENDENT_AMBULATORY_CARE_PROVIDER_SITE_OTHER): Payer: BLUE CROSS/BLUE SHIELD | Admitting: Obstetrics

## 2016-01-20 VITALS — BP 125/79 | HR 86 | Wt 154.0 lb

## 2016-01-20 DIAGNOSIS — Z9889 Other specified postprocedural states: Secondary | ICD-10-CM

## 2016-01-20 DIAGNOSIS — N939 Abnormal uterine and vaginal bleeding, unspecified: Secondary | ICD-10-CM | POA: Diagnosis not present

## 2016-01-21 ENCOUNTER — Encounter: Payer: Self-pay | Admitting: Obstetrics

## 2016-01-21 NOTE — Progress Notes (Signed)
Patient ID: Chelsea Haley, female   DOB: 01-25-1976, 40 y.o.   MRN: [id number]  Chief Complaint  Patient presents with  . Follow-up    PostOp    HPI Chelsea Haley is a 40 y.o. female.  No complaints.  S/P Endometrial Ablation.  No vaginal bleeding since procedure.  Denies pelvic pain. HPI  Past Medical History:  Diagnosis Date  . Allergy   . Auditory processing disorder   . Binge eating   . Depression   . Dysrhythmia   . Endometriosis   . GERD (gastroesophageal reflux disease)   . History of chicken pox   . History of frequent urinary tract infections   . History of kidney stones   . History of seizure disorder    as a child. No medication since age 55.  Marland Kitchen Hyperlipidemia   . Hypertension   . Liver hemangioma    at least 2  . Migraines   . PCOS (polycystic ovarian syndrome)   . PONV (postoperative nausea and vomiting)   . Seizures ([place])    last one age 40, continues to have abnormal EEG's and MRI's  . Tachycardia     Past Surgical History:  Procedure Laterality Date  . CESAREAN SECTION  2002  . CESAREAN SECTION N/A 02/07/2014   Procedure: CESAREAN SECTION;  Surgeon: Shelly Bombard, MD;  Location: [facility];  Service: Obstetrics;  Laterality: N/A;  . CHOLECYSTECTOMY  2006  . DILITATION & CURRETTAGE/HYSTROSCOPY WITH HYDROTHERMAL ABLATION N/A 07/03/2015   Procedure: DILATATION & CURETTAGE/HYSTEROSCOPY WITH HYDROTHERMAL ABLATION;  Surgeon: Shelly Bombard, MD;  Location: [facility];  Service: Gynecology;  Laterality: N/A;  . Laporoscopy    . PILONIDAL CYST EXCISION  2003  . WISDOM TOOTH EXTRACTION Bilateral     Family History  Problem Relation Age of Onset  . Arthritis Father   . Hypertension Father   . Diabetes Father   . Heart disease Maternal Grandmother     ICD  . Cancer Maternal Grandfather     lung    Social History Social History  Substance Use Topics  . Smoking status: Current Every Day Smoker    Packs/day: 0.50    Years: 20.00    Types: Cigarettes  .  Smokeless tobacco: Never Used     Comment: 4 cigarettes daily  . Alcohol use 0.0 oz/week     Comment: rare    Allergies  Allergen Reactions  . Adhesive [Tape]     Skin sensitivity  . Banana     Migraine, if they are cooked or over ripe.   . Morphine And Related Itching    hallucinations  . Other Itching    WALNUTS.  Gums itch. Cashews.   . Pertussis Vaccines     seizures  . Tramadol Anxiety    Current Outpatient Prescriptions  Medication Sig Dispense Refill  . metoprolol succinate (TOPROL-XL) 50 MG 24 hr tablet Take 1 tablet (50 mg total) by mouth daily. Take with or immediately following a meal. 30 tablet 1  . norethindrone-ethinyl estradiol 1/35 ([place] 1/35, 28,) tablet Patient is to take continuous active pills for 3 months- then have a cycle. 4 Package 4  . triamterene-hydrochlorothiazide (DYAZIDE) 37.5-25 MG capsule Take 1 each (1 capsule total) by mouth daily. 30 capsule 11   No current facility-administered medications for this visit.     Review of Systems Review of Systems Constitutional: negative for fatigue and weight loss Respiratory: negative for cough and wheezing Cardiovascular: negative for chest  pain, fatigue and palpitations Gastrointestinal: negative for abdominal pain and change in bowel habits Genitourinary:negative Integument/breast: negative for nipple discharge Musculoskeletal:negative for myalgias Neurological: negative for gait problems and tremors Behavioral/Psych: negative for abusive relationship, depression Endocrine: negative for temperature intolerance      Blood pressure 125/79, pulse 86, weight 154 lb (69.9 kg), not currently breastfeeding.  Physical Exam Physical Exam General:   alert  Skin:   no rash or abnormalities  Lungs:   clear to auscultation bilaterally  Heart:   regular rate and rhythm, S1, S2 normal, no murmur, click, rub or gallop  Breasts:   normal without suspicious masses, skin or nipple changes or axillary nodes   Abdomen:  normal findings: no organomegaly, soft, non-tender and no hernia  Pelvis:  External genitalia: normal general appearance Urinary system: urethral meatus normal and bladder without fullness, nontender Vaginal: normal without tenderness, induration or masses Cervix: normal appearance Adnexa: normal bimanual exam Uterus: anteverted and non-tender, normal size    50% of 15 min visit spent on counseling and coordination of care.    Data Reviewed Pathology  Assessment     AUB.  S/P Endometrial Ablation ( HTA ).  Doing well.    Plan    Follow up 5 months for Pap and Annual.  No orders of the defined types were placed in this encounter.  No orders of the defined types were placed in this encounter.

## 2016-01-25 ENCOUNTER — Telehealth: Payer: Self-pay | Admitting: Cardiology

## 2016-01-25 NOTE — Telephone Encounter (Signed)
Did not need this encounter °

## 2016-01-27 ENCOUNTER — Encounter (INDEPENDENT_AMBULATORY_CARE_PROVIDER_SITE_OTHER): Payer: BLUE CROSS/BLUE SHIELD

## 2016-01-27 ENCOUNTER — Encounter: Payer: Self-pay | Admitting: Nurse Practitioner

## 2016-01-27 ENCOUNTER — Ambulatory Visit (INDEPENDENT_AMBULATORY_CARE_PROVIDER_SITE_OTHER): Payer: BLUE CROSS/BLUE SHIELD | Admitting: Nurse Practitioner

## 2016-01-27 VITALS — BP 115/76 | HR 80 | Ht 60.0 in | Wt 151.6 lb

## 2016-01-27 DIAGNOSIS — R002 Palpitations: Secondary | ICD-10-CM

## 2016-01-27 DIAGNOSIS — I10 Essential (primary) hypertension: Secondary | ICD-10-CM | POA: Diagnosis not present

## 2016-01-27 LAB — BASIC METABOLIC PANEL
BUN: 12 mg/dL (ref 7–25)
CHLORIDE: 93 mmol/L — AB (ref 98–110)
CO2: 24 mmol/L (ref 20–31)
Calcium: 9.9 mg/dL (ref 8.6–10.2)
Creat: 0.8 mg/dL (ref 0.50–1.10)
Glucose, Bld: 80 mg/dL (ref 65–99)
Potassium: 3.6 mmol/L (ref 3.5–5.3)
SODIUM: 132 mmol/L — AB (ref 135–146)

## 2016-01-27 LAB — TSH: TSH: 0.73 mIU/L

## 2016-01-27 LAB — MAGNESIUM: Magnesium: 2.1 mg/dL (ref 1.5–2.5)

## 2016-01-27 NOTE — Progress Notes (Signed)
Office Visit    Patient Name: BLAKELI SAMPLEY Date of Encounter: 01/27/2016  Primary Care Provider:  Dineen Kid, MD Primary Cardiologist:  B. Stanford Breed, MD   Chief Complaint    40 y/o ? with a h/o inappropriate sinus tachycardia who presents for f/u related to recent bouts of tachycardia and frequent palpitations.  Past Medical History    Past Medical History:  Diagnosis Date  . Allergy   . Auditory processing disorder   . Binge eating   . Depression   . Endometriosis    a. 2017 s/p endometrial ablation.  . Essential hypertension   . GERD (gastroesophageal reflux disease)   . History of chicken pox   . History of frequent urinary tract infections   . History of kidney stones   . History of seizure disorder    as a child. No medication since age 15.  Marland Kitchen Hyperlipidemia   . Inappropriate sinus tachycardia    a. previously on beta blocker- d/c'd @ time of pregnancy in 2015; b. 09/2011 Echo: EF 60-65%; c. 10/2011 ETT: ex time 88mins, stopped 2/2 fatigue, inadequate HR (beta blocker), no ischemia.  . Liver hemangioma    at least 2  . Migraines   . PCOS (polycystic ovarian syndrome)   . PONV (postoperative nausea and vomiting)   . Seizures ([place])    last one age 68, continues to have abnormal EEG's and MRI's   Past Surgical History:  Procedure Laterality Date  . CESAREAN SECTION  2002  . CESAREAN SECTION N/A 02/07/2014   Procedure: CESAREAN SECTION;  Surgeon: Shelly Bombard, MD;  Location: [facility];  Service: Obstetrics;  Laterality: N/A;  . CHOLECYSTECTOMY  2006  . DILITATION & CURRETTAGE/HYSTROSCOPY WITH HYDROTHERMAL ABLATION N/A 07/03/2015   Procedure: DILATATION & CURETTAGE/HYSTEROSCOPY WITH HYDROTHERMAL ABLATION;  Surgeon: Shelly Bombard, MD;  Location: [facility];  Service: Gynecology;  Laterality: N/A;  . Laporoscopy    . PILONIDAL CYST EXCISION  2003  . WISDOM TOOTH EXTRACTION Bilateral     Allergies  Allergies  Allergen Reactions  . Adhesive [Tape]     Skin  sensitivity  . Banana     Migraine, if they are cooked or over ripe.   . Morphine And Related Itching    hallucinations  . Other Itching    WALNUTS.  Gums itch. Cashews.   . Pertussis Vaccines     seizures  . Tramadol Anxiety    History of Present Illness    40 y/o ? with a h/o HTN, remote tobacco abuse, and inappropriate sinus tachycardai s/p prior eval in 2013 with nl echo and ETT ([facility] due to  blocker effect).  Last seen in 01/2015 at which time she was doing well.  She had come off of  blocker during her pregnancy in 2015 but resumed it following that 01/2015 visit.  Over the past year, she did reasonably well w/o much in the way of palpitations until about 2-3 wks ago.  She has lost a considerable amt of wt through a low carb diet and exercise/jogging (was 217 a year ago, now 151).  Beginning about 2-3 wks ago, she has noticed more frequent palpitations, which she identifies as being PVC's and are associated with fluttering in her chest.  At times, she has also noted elevated HR's on her fitbit, up into the 160's and 170's, usually while @ rest, associated with lightheadedness, and resolving within a few seconds.  She denies chest pain, dyspnea,  pnd, orthopnea, n, v, dizziness, syncope, edema, weight gain, or early satiety.   Home Medications    Prior to Admission medications   Medication Sig Start Date End Date Taking? Authorizing Provider  metoprolol succinate (TOPROL-XL) 50 MG 24 hr tablet Take 1 tablet (50 mg total) by mouth daily. Take with or immediately following a meal. 12/22/15   Lelon Perla, MD  norethindrone-ethinyl estradiol 1/35 ([place] 1/35, 28,) tablet Patient is to take continuous active pills for 3 months- then have a cycle. 04/22/15   Shelly Bombard, MD  triamterene-hydrochlorothiazide (DYAZIDE) 37.5-25 MG capsule Take 1 each (1 capsule total) by mouth daily. 04/08/15   Shelly Bombard, MD    Review of Systems    Palpitations and  tachycardia as outlined above.  She denies chest pain, dyspnea, pnd, orthopnea, n, v, dizziness, syncope, edema, weight gain, or early satiety. All other systems reviewed and are otherwise negative except as noted above.  Physical Exam    VS:  BP 115/76   Pulse 80   Ht 5' (1.524 m)   Wt 151 lb 9.6 oz (68.8 kg)   SpO2 99%   BMI 29.61 kg/m  , BMI Body mass index is 29.61 kg/m. GEN: Well nourished, well developed, in no acute distress.  HEENT: normal.  Neck: Supple, no JVD, carotid bruits, or masses. Cardiac: RRR, no murmurs, rubs, or gallops. No clubbing, cyanosis, edema.  Radials/DP/PT 2+ and equal bilaterally.  Respiratory:  Respirations regular and unlabored, clear to auscultation bilaterally. GI: Soft, nontender, nondistended, BS + x 4. MS: no deformity or atrophy. Skin: warm and dry, no rash. Neuro:  Strength and sensation are intact. Psych: Normal affect.  Accessory Clinical Findings    ECG - RSR, 71, no acute st/t changes.  Assessment & Plan    1.  Inappropriate sinus tachycardia/Palpitations:  Pt has a h/o inappropriate sinus tachycardia, dx by cardiology in [facility] in 2011.  She has been well-managed on  blocker therapy but over the past 2-3 wks, she has noted intermittent tachycardia with rates into the 170's (by her fitbit) along with fluttering, which she believes are associated with increased frequency of pvc's.  Given new Ss despite  blocker therapy, I will arrange for a bmet, Mg, and TSH today, along with a repeat echo and 30 day event monitor.  I'm concerned that tachycardia in the 170's may represent something other than sinus tachy.  We did discuss potentially titrating her  blocker dose however her BP is currently only 115.  She is on a diuretic and notes that w/o it, she develops lower ext edema.  I advised that if she is having freq palpitations and/or tachycardia, she may take an additional 1/2 of a toprol prn.  2.  Essential HTN:  BP stable on  blocker and  diuretic.  3.  Disposition: f/u labs today with echo and monitor pending.  F/u with me or Dr. Stanford Breed in 4-6 wks after monitoring complete.   Murray Hodgkins, NP 01/27/2016, 3:54 PM

## 2016-01-27 NOTE — Patient Instructions (Signed)
Medication Instructions: Ignacia Bayley, NP recommends that you continue on your current medications as directed. Please refer to the Current Medication list given to you today.  Labwork: Your physician recommends that you return for lab work TODAY.  Testing/Procedures: 1. Echocardiogram - Your physician has requested that you have an echocardiogram. Echocardiography is a painless test that uses sound waves to create images of your heart. It provides your doctor with information about the size and shape of your heart and how well your heart's chambers and valves are working. This procedure takes approximately one hour. There are no restrictions for this procedure. This will be performed at our [facility] location - [address] [place] physician has recommended that you wear an event monitor. Event monitors are medical devices that record the heart's electrical activity. Doctors most often [place] these monitors to diagnose arrhythmias. Arrhythmias are problems with the speed or rhythm of the heartbeat. The monitor is a small, portable device. You can wear one while you do your normal daily activities. This is usually used to diagnose what is causing palpitations/syncope (passing out).  Follow-up: Your physician recommends that you schedule a follow-up appointment in 5 weeks with either Dr Stanford Breed or Ignacia Bayley, NP.  If you need a refill on your cardiac medications before your next appointment, please call your pharmacy.

## 2016-01-28 ENCOUNTER — Ambulatory Visit: Payer: BLUE CROSS/BLUE SHIELD | Admitting: Cardiology

## 2016-01-28 ENCOUNTER — Telehealth: Payer: Self-pay | Admitting: Cardiology

## 2016-01-28 MED ORDER — TRIAMTERENE-HCTZ 37.5-25 MG PO TABS: 0.5000 | ORAL_TABLET | Freq: Every day | ORAL | 1 refills | Status: DC

## 2016-01-28 NOTE — Telephone Encounter (Signed)
LM for pt that they do make pill form of her Triamterene-hctz 37.5-25 and she is to take 1/2 tablet daily.

## 2016-01-28 NOTE — Addendum Note (Signed)
Addended by: Gaetano Net on: 01/28/2016 01:38 PM   Modules accepted: Orders

## 2016-01-28 NOTE — Telephone Encounter (Signed)
rx sent to [facility], [place], [place]

## 2016-01-28 NOTE — Telephone Encounter (Signed)
Pt is calling because she updated her pharmacy yesterday and her prescription today was still sent ot the wrong one in [place], she would like it sent to the [facility] [address] in [place].

## 2016-01-28 NOTE — Telephone Encounter (Signed)
Pt rtn call to [facility] ok to leave a message

## 2016-01-28 NOTE — Telephone Encounter (Signed)
Returned pts call and resent rx to correct pharmacy, [facility], [address].  Pt verbalized appreciation.

## 2016-01-28 NOTE — Telephone Encounter (Signed)
Pt is returning [place] call states its okay to write that new prescription.

## 2016-02-15 ENCOUNTER — Ambulatory Visit: Payer: BLUE CROSS/BLUE SHIELD | Admitting: Cardiology

## 2016-02-19 ENCOUNTER — Other Ambulatory Visit: Payer: Self-pay

## 2016-02-19 ENCOUNTER — Ambulatory Visit (HOSPITAL_COMMUNITY): Payer: BLUE CROSS/BLUE SHIELD | Attending: Internal Medicine

## 2016-02-19 DIAGNOSIS — R000 Tachycardia, unspecified: Secondary | ICD-10-CM | POA: Diagnosis not present

## 2016-02-19 DIAGNOSIS — E669 Obesity, unspecified: Secondary | ICD-10-CM | POA: Diagnosis not present

## 2016-02-19 DIAGNOSIS — Z87891 Personal history of nicotine dependence: Secondary | ICD-10-CM | POA: Diagnosis not present

## 2016-02-19 DIAGNOSIS — I371 Nonrheumatic pulmonary valve insufficiency: Secondary | ICD-10-CM | POA: Diagnosis not present

## 2016-02-19 DIAGNOSIS — I10 Essential (primary) hypertension: Secondary | ICD-10-CM | POA: Insufficient documentation

## 2016-02-19 DIAGNOSIS — I071 Rheumatic tricuspid insufficiency: Secondary | ICD-10-CM | POA: Diagnosis not present

## 2016-02-19 DIAGNOSIS — I340 Nonrheumatic mitral (valve) insufficiency: Secondary | ICD-10-CM | POA: Insufficient documentation

## 2016-02-19 DIAGNOSIS — R002 Palpitations: Secondary | ICD-10-CM | POA: Diagnosis present

## 2016-02-22 ENCOUNTER — Other Ambulatory Visit: Payer: Self-pay | Admitting: Cardiology

## 2016-02-22 DIAGNOSIS — R002 Palpitations: Secondary | ICD-10-CM

## 2016-03-01 ENCOUNTER — Other Ambulatory Visit: Payer: Self-pay | Admitting: Obstetrics

## 2016-03-01 DIAGNOSIS — Z3041 Encounter for surveillance of contraceptive pills: Secondary | ICD-10-CM

## 2016-03-16 ENCOUNTER — Ambulatory Visit (INDEPENDENT_AMBULATORY_CARE_PROVIDER_SITE_OTHER): Payer: BLUE CROSS/BLUE SHIELD | Admitting: Nurse Practitioner

## 2016-03-16 ENCOUNTER — Encounter: Payer: Self-pay | Admitting: Nurse Practitioner

## 2016-03-16 VITALS — BP 120/84 | HR 98 | Ht 60.0 in | Wt 146.0 lb

## 2016-03-16 DIAGNOSIS — R000 Tachycardia, unspecified: Secondary | ICD-10-CM

## 2016-03-16 DIAGNOSIS — Z720 Tobacco use: Secondary | ICD-10-CM | POA: Diagnosis not present

## 2016-03-16 DIAGNOSIS — R002 Palpitations: Secondary | ICD-10-CM | POA: Diagnosis not present

## 2016-03-16 DIAGNOSIS — I493 Ventricular premature depolarization: Secondary | ICD-10-CM | POA: Diagnosis not present

## 2016-03-16 MED ORDER — VARENICLINE TARTRATE 0.5 MG X 11 & 1 MG X 42 PO MISC: ORAL | 0 refills | Status: DC

## 2016-03-16 MED ORDER — VARENICLINE TARTRATE 1 MG PO TABS
1.0000 mg | ORAL_TABLET | Freq: Two times a day (BID) | ORAL | 2 refills | Status: DC
Start: 2016-03-16 — End: 2017-05-25

## 2016-03-16 NOTE — Progress Notes (Signed)
Office Visit    Patient Name: Chelsea Haley Date of Encounter: 03/16/2016  Primary Care Provider:  Dineen Kid, MD Primary Cardiologist:  B. Stanford Breed, MD   Chief Complaint    41 year old female with history of inappropriate sinus tachycardia who presents for follow-up after recent echo and event monitoring secondary to tachycardia and palpitations.  Past Medical History    Past Medical History:  Diagnosis Date  . Allergy   . Auditory processing disorder   . Binge eating   . Depression   . Endometriosis    a. 2017 s/p endometrial ablation.  . Essential hypertension   . GERD (gastroesophageal reflux disease)   . History of chicken pox   . History of frequent urinary tract infections   . History of kidney stones   . History of seizure disorder    as a child. No medication since age 42.  Marland Kitchen Hyperlipidemia   . Inappropriate sinus tachycardia    a. previously on beta blocker- d/c'd @ time of pregnancy in 2015; b. 09/2011 Echo: EF 60-65%; c. 10/2011 ETT: ex time 81mins, stopped 2/2 fatigue, inadequate HR (beta blocker), no ischemia;  d. 01/2016 Event Monitor - sinus rhythm, pvc's;  e. 02/2016 Echo: 02/2016 EF 55-60%, Gr2 DD.  Marland Kitchen Liver hemangioma    at least 2  . Migraines   . PCOS (polycystic ovarian syndrome)   . PONV (postoperative nausea and vomiting)   . Seizures ([place])    last one age 62, continues to have abnormal EEG's and MRI's  Discharge Past Surgical History:  Procedure Laterality Date  . CESAREAN SECTION  2002  . CESAREAN SECTION N/A 02/07/2014   Procedure: CESAREAN SECTION;  Surgeon: Shelly Bombard, MD;  Location: [facility];  Service: Obstetrics;  Laterality: N/A;  . CHOLECYSTECTOMY  2006  . DILITATION & CURRETTAGE/HYSTROSCOPY WITH HYDROTHERMAL ABLATION N/A 07/03/2015   Procedure: DILATATION & CURETTAGE/HYSTEROSCOPY WITH HYDROTHERMAL ABLATION;  Surgeon: Shelly Bombard, MD;  Location: [facility];  Service: Gynecology;  Laterality: N/A;  . Laporoscopy    . PILONIDAL CYST  EXCISION  2003  . WISDOM TOOTH EXTRACTION Bilateral     Allergies  Allergies  Allergen Reactions  . Adhesive [Tape]     Skin sensitivity  . Banana     Migraine, if they are cooked or over ripe.   . Morphine And Related Itching    hallucinations  . Other Itching    [place]UTS.  Gums itch. Cashews.   . Pertussis Vaccines     seizures  . Tramadol Anxiety    History of Present Illness    41 year old female with history of hypertension, tobacco abuse, and inappropriate sinus tachycardia status post prior evaluation in 2013 with normal echo and treadmill. I last saw her in November 2017 secondary to increased palpitations and documented elevated heart rates on her fit bit, up to 170 bpm. I checked labs, revealing normal renal function, electrolytes, and TSH. Echo was performed and showed normal LV function with grade 2 diastolic dysfunction. An event monitor was also placed and this showed sinus rhythm with occasional PVCs. There were no prolonged tachycardias. Since her last visit, she has done reasonably well. She has noticed a decrease in overall burden of palpitations and has had no recurrence of prolonged tachycardia. She continues to smoke a few cigarettes a day but is eager to quit. She has been running on a regular basis and is hopeful to compete in a 5K. She has requested a prescription for Chantix.  She denies chest pain, palpitations, dyspnea, pnd, orthopnea, n, v, dizziness, syncope, edema, weight gain, or early satiety.  Home Medications    Prior to Admission medications   Medication Sig Start Date End Date Taking? Authorizing Provider  ALAYCEN 1/35 tablet TAKE 1 TABLET EVERY DAY OF CONTINUOUS ACTIVE PILLS FOR 3 MONTHS, THEN HAVE A CYCLE 03/01/16   Shelly Bombard, MD  hydrOXYzine (VISTARIL) 25 MG capsule Take 25 mg by mouth daily. 01/21/16   Historical Provider, MD  metoprolol succinate (TOPROL-XL) 50 MG 24 hr tablet TAKE 1 TABLET (50 MG TOTAL) BY MOUTH DAILY. TAKE WITH OR  IMMEDIATELY FOLLOWING A MEAL. 02/22/16   Lelon Perla, MD  oxybutynin (DITROPAN XL) 15 MG 24 hr tablet Take 15 mg by mouth daily. 01/21/16   Historical Provider, MD  pentosan polysulfate (ELMIRON) 100 MG capsule Take 200 mg by mouth 2 (two) times daily. 01/21/16   Historical Provider, MD  triamterene-hydrochlorothiazide (MAXZIDE-25) 37.5-25 MG tablet Take 0.5 tablets by mouth daily. 01/28/16   Rogelia Mire, NP    Review of Systems    As above, doing well with only occasional palpitations.  All other systems reviewed and are otherwise negative except as noted above.  Physical Exam    VS:  BP 120/84   Pulse 98   Ht 5' (1.524 m)   Wt 146 lb (66.2 kg)   BMI 28.51 kg/m  , BMI Body mass index is 28.51 kg/m. GEN: Well nourished, well developed, in no acute distress.  HEENT: normal.  Neck: Supple, no JVD, carotid bruits, or masses. Cardiac: RRR, no murmurs, rubs, or gallops. No clubbing, cyanosis, edema.  Radials/DP/PT 2+ and equal bilaterally.  Respiratory:  Respirations regular and unlabored, clear to auscultation bilaterally. GI: Soft, nontender, nondistended, BS + x 4. MS: no deformity or atrophy. Skin: warm and dry, no rash. Neuro:  Strength and sensation are intact. Psych: Normal affect.  Accessory Clinical Findings    Lab Results  Component Value Date   CREATININE 0.80 01/27/2016   BUN 12 01/27/2016   NA 132 (L) 01/27/2016   K 3.6 01/27/2016   CL 93 (L) 01/27/2016   CO2 24 01/27/2016   Lab Results  Component Value Date   TSH 0.73 01/27/2016   Echocardiogram and event monitor reviewed.  Assessment & Plan    1.  Inappropriate sinus tachycardia/palpitations: Upon last visit, patient was having increased episodes of tachycardia. Echo showed normal LV function. Labs were normal. Event monitoring did not reveal any tachycardia. She did have occasional PVCs. Symptoms have stabilized over the past month. She remains on beta blocker therapy and has not had to take any  additional when necessary beta blocker. No changes today.  2. PVCs: Noted on event monitoring. These are occasional and mildly symptomatic though not particularly bothersome. Continue beta blocker therapy.  3. Tobacco abuse: Patient smoking less than half a pack a day. She is interested in quitting and has requested a prescription for Chantix. She has used this before with success but then relapsed about a year after quitting. I have provided her with a prescription for Chantix and congratulated her on her efforts to quit.  4. Disposition: Follow-up with Dr. Stanford Breed in one year or sooner if necessary.   Murray Hodgkins, NP 03/16/2016, 10:24 AM

## 2016-03-16 NOTE — Patient Instructions (Signed)
Chelsea Bayley, NP has recommended making the following medication changes: 1. START Chantix  Your physician recommends that you schedule a follow-up appointment in 1 year with Dr Stanford Breed. You will receive a reminder letter in the mail two months in advance. If you don't receive a letter, please call our office to schedule the follow-up appointment.  If you need a refill on your cardiac medications before your next appointment, please call your pharmacy.

## 2016-07-21 ENCOUNTER — Other Ambulatory Visit: Payer: Self-pay

## 2016-07-21 MED ORDER — TRIAMTERENE-HCTZ 37.5-25 MG PO TABS: 0.5000 | ORAL_TABLET | Freq: Every day | ORAL | 2 refills | Status: DC

## 2016-07-22 ENCOUNTER — Other Ambulatory Visit: Payer: Self-pay | Admitting: Nurse Practitioner

## 2016-08-03 ENCOUNTER — Encounter: Payer: Self-pay | Admitting: Nurse Practitioner

## 2016-08-05 ENCOUNTER — Other Ambulatory Visit: Payer: Self-pay | Admitting: Nurse Practitioner

## 2016-08-30 ENCOUNTER — Other Ambulatory Visit: Payer: Self-pay | Admitting: Obstetrics

## 2016-08-30 DIAGNOSIS — Z3041 Encounter for surveillance of contraceptive pills: Secondary | ICD-10-CM

## 2017-03-03 ENCOUNTER — Other Ambulatory Visit: Payer: Self-pay | Admitting: Obstetrics

## 2017-03-03 DIAGNOSIS — Z3041 Encounter for surveillance of contraceptive pills: Secondary | ICD-10-CM

## 2017-04-15 ENCOUNTER — Other Ambulatory Visit: Payer: Self-pay | Admitting: Cardiology

## 2017-04-15 DIAGNOSIS — R002 Palpitations: Secondary | ICD-10-CM

## 2017-05-11 ENCOUNTER — Other Ambulatory Visit: Payer: Self-pay | Admitting: Cardiology

## 2017-05-11 DIAGNOSIS — R002 Palpitations: Secondary | ICD-10-CM

## 2017-05-25 ENCOUNTER — Ambulatory Visit (INDEPENDENT_AMBULATORY_CARE_PROVIDER_SITE_OTHER): Payer: Managed Care, Other (non HMO) | Admitting: Physician Assistant

## 2017-05-25 ENCOUNTER — Other Ambulatory Visit (INDEPENDENT_AMBULATORY_CARE_PROVIDER_SITE_OTHER): Payer: Managed Care, Other (non HMO)

## 2017-05-25 ENCOUNTER — Encounter: Payer: Self-pay | Admitting: Physician Assistant

## 2017-05-25 VITALS — BP 120/86 | HR 80 | Ht 61.0 in | Wt 192.1 lb

## 2017-05-25 DIAGNOSIS — R1032 Left lower quadrant pain: Secondary | ICD-10-CM

## 2017-05-25 DIAGNOSIS — R1031 Right lower quadrant pain: Secondary | ICD-10-CM

## 2017-05-25 DIAGNOSIS — R110 Nausea: Secondary | ICD-10-CM

## 2017-05-25 DIAGNOSIS — R195 Other fecal abnormalities: Secondary | ICD-10-CM

## 2017-05-25 LAB — COMPREHENSIVE METABOLIC PANEL
ALBUMIN: 4.4 g/dL (ref 3.5–5.2)
ALT: 12 U/L (ref 0–35)
AST: 15 U/L (ref 0–37)
Alkaline Phosphatase: 26 U/L — ABNORMAL LOW (ref 39–117)
BUN: 18 mg/dL (ref 6–23)
CALCIUM: 9.7 mg/dL (ref 8.4–10.5)
CO2: 25 mEq/L (ref 19–32)
CREATININE: 0.82 mg/dL (ref 0.40–1.20)
Chloride: 103 mEq/L (ref 96–112)
GFR: 81.65 mL/min (ref >60.00)
Glucose, Bld: 102 mg/dL — ABNORMAL HIGH (ref 70–99)
POTASSIUM: 4.1 meq/L (ref 3.5–5.1)
Sodium: 136 mEq/L (ref 135–145)
Total Bilirubin: 0.3 mg/dL (ref 0.2–1.2)
Total Protein: 7.5 g/dL (ref 6.0–8.3)

## 2017-05-25 LAB — CBC WITH DIFFERENTIAL/PLATELET
Basophils Absolute: 0.1 10*3/uL (ref 0.0–0.1)
Basophils Relative: 0.9 % (ref 0.0–3.0)
Eosinophils Absolute: 0.2 10*3/uL (ref 0.0–0.7)
Eosinophils Relative: 2.5 % (ref 0.0–5.0)
HCT: 40.2 % (ref 36.0–46.0)
Hemoglobin: 14 g/dL (ref 12.0–15.0)
Lymphocytes Relative: 28.6 % (ref 12.0–46.0)
Lymphs Abs: 2.1 10*3/uL (ref 0.7–4.0)
MCHC: 34.7 g/dL (ref 30.0–36.0)
MCV: 86.7 fl (ref 78.0–100.0)
Monocytes Absolute: 0.4 10*3/uL (ref 0.1–1.0)
Monocytes Relative: 5.4 % (ref 3.0–12.0)
Neutro Abs: 4.6 10*3/uL (ref 1.4–7.7)
Neutrophils Relative %: 62.6 % (ref 43.0–77.0)
Platelets: 318 10*3/uL (ref 150.0–400.0)
RBC: 4.64 Mil/uL (ref 3.87–5.11)
RDW: 13.1 % (ref 11.5–15.5)
WBC: 7.4 10*3/uL (ref 4.0–10.5)

## 2017-05-25 LAB — IGA: IgA: 128 mg/dL (ref 68–378)

## 2017-05-25 NOTE — Patient Instructions (Signed)
We have given you a low FODMAP diet handout.   We have sent the following medications to your pharmacy for you to pick up at your convenience: Dicyclomine 10 mg three times a day 20-30 minutes before meals.   Start a daily probiotic such as Electronics engineer, daily for 2 months.   Keep a food journal.   Your physician has requested that you go to the basement for lab work before leaving today.

## 2017-05-25 NOTE — Progress Notes (Addendum)
Chief Complaint: Abdominal pain, diarrhea  HPI:    Chelsea Haley is a 42 year old female with a past medical history as listed below including binge eating depression, GERD, inappropriate sinus tachycardia and others listed below, who followed with Dr. Hilarie Fredrickson previously for nausea and was referred to me by Via, Lennette Bihari, MD for a complaint of abdominal pain and diarrhea.      Last OV 03/23/12 for epigastric pain and pressure unresponsive to daily PPI therapy.  EGD completed 03/29/12 with slightly irregular Z line, gastritis in the antrum and otherwise normal.  Biopsies showed small focus of active gastritis in the antrum and mild inflammation at the GE junction.    Today, explains that over the past year she has had at least 15+ days of severe episodes of lower abdominal cramping pain which sometimes bends her over and are rated as a 8-9/10 these typically result in urgent loose formed stools 9/10 times, occasionally hard stools.  Sometimes does not make it to the toilet. During these episodes also feels nauseous and lightheaded like she is going to pass out.  After these episodes, which last 10-20 minutes she feels weak for the next day or two.      Also describes that her anus will itch sometimes.  Wonders if this is from frequent stools during those episodes.    Patient notes that she had lost a total of 75 pounds between 2017 and 2018, but stopped watching her diet as much and has gained 50 of these pounds back over the past year.    Denies fever, chills, blood in her stool, melena, anorexia, vomiting or symptoms that awaken her at night.  Past Medical History:  Diagnosis Date  . Allergy   . Auditory processing disorder   . Binge eating   . Depression   . Endometriosis    a. 2017 s/p endometrial ablation.  . Essential hypertension   . GERD (gastroesophageal reflux disease)   . History of chicken pox   . History of frequent urinary tract infections   . History of kidney stones   . History of  seizure disorder    as a child. No medication since age 89.  Marland Kitchen Hyperlipidemia   . Inappropriate sinus tachycardia    a. previously on beta blocker- d/c'd @ time of pregnancy in 2015; b. 09/2011 Echo: EF 60-65%; c. 10/2011 ETT: ex time 28mins, stopped 2/2 fatigue, inadequate HR (beta blocker), no ischemia;  d. 01/2016 Event Monitor - sinus rhythm, pvc's;  e. 02/2016 Echo: 02/2016 EF 55-60%, Gr2 DD.  Marland Kitchen Liver hemangioma    at least 2  . Migraines   . PCOS (polycystic ovarian syndrome)   . PONV (postoperative nausea and vomiting)   . Seizures ([place])    last one age 30, continues to have abnormal EEG's and MRI's    Past Surgical History:  Procedure Laterality Date  . CESAREAN SECTION  2002  . CESAREAN SECTION N/A 02/07/2014   Procedure: CESAREAN SECTION;  Surgeon: Shelly Bombard, MD;  Location: [facility];  Service: Obstetrics;  Laterality: N/A;  . CHOLECYSTECTOMY  2006  . DILITATION & CURRETTAGE/HYSTROSCOPY WITH HYDROTHERMAL ABLATION N/A 07/03/2015   Procedure: DILATATION & CURETTAGE/HYSTEROSCOPY WITH HYDROTHERMAL ABLATION;  Surgeon: Shelly Bombard, MD;  Location: [facility];  Service: Gynecology;  Laterality: N/A;  . Laporoscopy    . PILONIDAL CYST EXCISION  2003  . WISDOM TOOTH EXTRACTION Bilateral     Current Outpatient Medications  Medication Sig Dispense Refill  . ALAYCEN  1/35 tablet TAKE 1 TABLET EVERY DAY OF CONTINUOUS ACTIVE PILLS FOR 3 MONTHS, THEN HAVE A CYCLE 84 tablet 2  . metoprolol succinate (TOPROL-XL) 50 MG 24 hr tablet TAKE 1 TABLET BY MOUTH DAILY WITH OR IMMEDIATELY FOLLOWING A MEAL. 30 tablet 0  . pentosan polysulfate (ELMIRON) 100 MG capsule Take 200 mg by mouth 2 (two) times daily.    Marland Kitchen triamterene-hydrochlorothiazide (MAXZIDE-25) 37.5-25 MG tablet Take 0.5 tablets by mouth daily. 45 tablet 2  . varenicline (CHANTIX CONTINUING MONTH PAK) 1 MG tablet Take 1 tablet (1 mg total) by mouth 2 (two) times daily. 60 tablet 2  . varenicline (CHANTIX STARTING MONTH PAK) 0.5 MG X 11 & 1  MG X 42 tablet Take one 0.5 mg tab by mouth daily x 3 days, then increase to one 0.5 mg tab twice daily x 4 days, then increase to one 1 mg tab twice daily 53 tablet 0   No current facility-administered medications for this visit.     Allergies as of 05/25/2017 - Review Complete 05/25/2017  Allergen Reaction Noted  . Adhesive [tape]  09/20/2011  . Banana  09/20/2011  . Morphine and related Itching 09/20/2011  . Other Itching 09/20/2011  . Pertussis vaccines  09/20/2011  . Tramadol Anxiety 02/28/2012    Family History  Problem Relation Age of Onset  . Arthritis Father   . Hypertension Father   . Diabetes Father   . Heart disease Maternal Grandmother        ICD  . Cancer Maternal Grandfather        lung    Social History   Socioeconomic History  . Marital status: Married    Spouse name: Not on file  . Number of children: 1  . Years of education: Not on file  . Highest education level: Not on file  Social Needs  . Financial resource strain: Not on file  . Food insecurity - worry: Not on file  . Food insecurity - inability: Not on file  . Transportation needs - medical: Not on file  . Transportation needs - non-medical: Not on file  Occupational History    Employer: STUDENT    Comment: Air cabin crew  Tobacco Use  . Smoking status: Current Every Day Smoker    Packs/day: 0.50    Years: 20.00    Pack years: 10.00    Types: Cigarettes  . Smokeless tobacco: Never Used  . Tobacco comment: 4 cigarettes daily  Substance and Sexual Activity  . Alcohol use: Yes    Alcohol/week: 0.0 oz    Comment: rare  . Drug use: No  . Sexual activity: Yes    Partners: Male    Birth control/protection: OCP, Pill  Other Topics Concern  . 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, sports in Education             Review of Systems:    Constitutional: No  weight loss, fever or chills Skin: No rash Cardiovascular: No chest pain   Respiratory: No SOB Gastrointestinal: See HPI and otherwise negative Genitourinary: No dysuria  Neurological: No headache, dizziness or syncope Musculoskeletal: No new muscle or joint pain Hematologic: No bleeding Psychiatric: No history of depression or anxiety   Physical Exam:  Vital signs: BP 120/86   Pulse 80   Ht 5\' 1"  (1.829 m)  Wt 192 lb 2 oz (87.1 kg)   BMI 36.30 kg/m   Constitutional:   Pleasant overweight Caucasian female appears to be in NAD, Well developed, Well nourished, alert and cooperative Head:  Normocephalic and atraumatic. Eyes:   PEERL, EOMI. No icterus. Conjunctiva pink. Ears:  Normal auditory acuity. Neck:  Supple Throat: Oral cavity and pharynx without inflammation, swelling or lesion.  Respiratory: Respirations even and unlabored. Lungs clear to auscultation bilaterally.   No wheezes, crackles, or rhonchi.  Cardiovascular: Normal S1, S2. No MRG. Regular rate and rhythm. No peripheral edema, cyanosis or pallor.  Gastrointestinal:  Soft, nondistended, nontender. No rebound or guarding. Normal bowel sounds. No appreciable masses or hepatomegaly. Rectal:  Not performed.  Msk:  Symmetrical without gross deformities. Without edema, no deformity or joint abnormality.  Neurologic:  Alert and  oriented x4;  grossly normal neurologically.  Skin:   Dry and intact without significant lesions or rashes. Psychiatric:  Demonstrates good judgement and reason without abnormal affect or behaviors.  No recent labs or imaging.  Assessment: 1.  Lower abdominal cramping: 15+ days of severe abdominal cramping resulting in a stool; most likely IBS 2.  Loose stools: With above, otherwise normal 3.  Nausea: With episodes above  Plan: 1.  Discussed irritable bowel syndrome with the patient today.  This is most consistent with her symptoms as listed above. 2.  Recommend a trial of the low FODMAP diet  for 6 weeks. 3.  Prescribed Dicyclomine 10 mg 3 times daily, 20-30 minutes before meals.  #90 with 1 refill 4.  Would recommend patient start a daily probiotic such as Align for the next 2 months.  Provided her with coupons. 5.  Recommend the patient keep a food journal to better tell if there are foods she is eating that are causing her symptoms. 6.  Encouraged the patient to get back on her diet and exercise regimen. 7. Ordered labs including CBC, CMP, Total IgA and ttG IgA 7.  Patient to follow with me in 4-6 weeks.  She was assigned to Dr. Hilarie Fredrickson today.  Chelsea Newer, PA-C [facility] 05/25/2017, 8:53 AM  Cc: Dineen Kid, MD   Addendum: Reviewed and agree with initial management. Pyrtle, Lajuan Lines, MD

## 2017-05-26 ENCOUNTER — Telehealth: Payer: Self-pay | Admitting: Physician Assistant

## 2017-05-26 LAB — TISSUE TRANSGLUTAMINASE, IGA: (tTG) Ab, IgA: 1 U/mL

## 2017-05-26 MED ORDER — DICYCLOMINE HCL 10 MG PO CAPS: 10.0000 mg | ORAL_CAPSULE | Freq: Three times a day (TID) | ORAL | 3 refills | Status: DC

## 2017-05-26 NOTE — Telephone Encounter (Signed)
Rx sent to pharmacy   

## 2017-06-13 NOTE — Progress Notes (Signed)
Cardiology Office Note   Date:  06/14/2017   ID:  Chelsea Haley, DOB 1976/03/10, MRN [id number]  PCP:  Chelsea Kid, MD  Cardiologist: Dr. Stanford Haley  Chief Complaint  Patient presents with  . Follow-up    medication management     History of Present Illness: Chelsea Haley is a 42 y.o. female who presents for ongoing assessment and management of inappropriate sinus tachycardia who was last seen in the office on 03/16/2016 by Chelsea Bayley, NP on 03/16/2016.  She recently had a cardiac monitor which revealed tachycardia.  Repeat echocardiogram revealed normal LV systolic function.  Her labs were found to be normal.  The patient remained on beta-blocker therapy, with stabilization of symptoms.  Comes today complaining shoulder pain and shin pain while she is jogging.  This normally does not occur.  She has not been able to run for a while and she is in the process of moving and also having some GI health issues.  Did a 5K recently in March 2019 and had to stop because of worsening shortness of breath.  States that she walked part of the way rested completely.  She realizes that she is "a little out of shape" because she has not been running as consistently discomfort was concerning for her.   She is also recently injured her left wrist.  She believes this is from packing, as they are moving to a new home. She is wearing a brace.   Past Medical History:  Diagnosis Date  . Allergy   . Auditory processing disorder   . Binge eating   . Depression   . Endometriosis    a. 2017 s/p endometrial ablation.  . Essential hypertension   . GERD (gastroesophageal reflux disease)   . History of chicken pox   . History of frequent urinary tract infections   . History of kidney stones   . History of seizure disorder    as a child. No medication since age 66.  Marland Kitchen Hyperlipidemia   . Inappropriate sinus tachycardia    a. previously on beta blocker- d/c'd @ time of pregnancy in 2015; b. 09/2011 Echo: EF 60-65%; c.  10/2011 ETT: ex time 98mins, stopped 2/2 fatigue, inadequate HR (beta blocker), no ischemia;  d. 01/2016 Event Monitor - sinus rhythm, pvc's;  e. 02/2016 Echo: 02/2016 EF 55-60%, Gr2 DD.  Marland Kitchen Liver hemangioma    at least 2  . Migraines   . PCOS (polycystic ovarian syndrome)   . PONV (postoperative nausea and vomiting)   . Seizures ([place])    last one age 55, continues to have abnormal EEG's and MRI's    Past Surgical History:  Procedure Laterality Date  . CESAREAN SECTION  2002  . CESAREAN SECTION N/A 02/07/2014   Procedure: CESAREAN SECTION;  Surgeon: Chelsea Bombard, MD;  Location: [facility];  Service: Obstetrics;  Laterality: N/A;  . CHOLECYSTECTOMY  2006  . DILITATION & CURRETTAGE/HYSTROSCOPY WITH HYDROTHERMAL ABLATION N/A 07/03/2015   Procedure: DILATATION & CURETTAGE/HYSTEROSCOPY WITH HYDROTHERMAL ABLATION;  Surgeon: Chelsea Bombard, MD;  Location: [facility];  Service: Gynecology;  Laterality: N/A;  . Laporoscopy    . PILONIDAL CYST EXCISION  2003  . WISDOM TOOTH EXTRACTION Bilateral      Current Outpatient Medications  Medication Sig Dispense Refill  . dicyclomine (BENTYL) 10 MG capsule Take 1 capsule (10 mg total) by mouth 3 (three) times daily before meals. 90 capsule 3  . metoprolol succinate (TOPROL-XL) 50 MG 24 hr tablet TAKE  1 TABLET BY MOUTH DAILY WITH OR IMMEDIATELY FOLLOWING A MEAL. 30 tablet 0  . NORETHINDRONE ACET-ETHINYL EST PO Take by mouth daily.    . Probiotic Product (PROBIOTIC ADVANCED PO) Take by mouth.    . triamterene-hydrochlorothiazide (MAXZIDE-25) 37.5-25 MG tablet Take 0.5 tablets by mouth daily. 45 tablet 2   No current facility-administered medications for this visit.     Allergies:   Adhesive [tape]; Banana; Morphine and related; Other; Pertussis vaccines; and Tramadol    Social History:  The patient  reports that she quit smoking about 13 months ago. Her smoking use included cigarettes. She has a 10.00 pack-year smoking history. She has never used  smokeless tobacco. She reports that she drinks alcohol. She reports that she does not use drugs.   Family History:  The patient's family history includes Arthritis in her father; Cancer in her maternal grandfather; Diabetes in her father; Heart disease in her maternal grandmother; Hypertension in her father.    ROS: All other systems are reviewed and negative. Unless otherwise mentioned in H&P    PHYSICAL EXAM: VS:  BP 120/76   Pulse (!) 51   Ht 5\' 1"  (1.549 m)   Wt 195 lb 6.4 oz (88.6 kg)   BMI 36.92 kg/m  , BMI Body mass index is 36.92 kg/m. GEN: Well nourished, well developed, in no acute distress  HEENT: normal  Neck: no JVD, carotid bruits, or masses Cardiac: RRR; no murmurs, rubs, or gallops,no edema  Respiratory:  clear to auscultation bilaterally, normal work of breathing GI: soft, nontender, nondistended, + BS MS: no deformity or atrophy  Skin: warm and dry, no rash Neuro:  Strength and sensation are intact Psych: euthymic mood, full affect   EKG:  NSR rate of 51 bpm  Recent Labs: 05/25/2017: ALT 12; BUN 18; Creatinine, Ser 0.82; Hemoglobin 14.0; Platelets 318.0; Potassium 4.1; Sodium 136    Lipid Panel    Component Value Date/Time   CHOL 197 01/23/2013 1656   TRIG 155 (H) 10/25/2011 0856   HDL 53 01/23/2013 1656   CHOLHDL 4.7 10/25/2011 0856   VLDL 31 10/25/2011 0856   LDLCALC 125 (H) 10/25/2011 0856      Wt Readings from Last 3 Encounters:  06/14/17 195 lb 6.4 oz (88.6 kg)  05/25/17 192 lb 2 oz (87.1 kg)  03/16/16 146 lb (66.2 kg)      Other studies Reviewed: Echocardiogram 03/09/2016  Left ventricle: The cavity size was normal. Wall thickness was   normal. Systolic function was normal. The estimated ejection   fraction was in the range of 55% to 60%. Features are consistent   with a pseudonormal left ventricular filling pattern, with   concomitant abnormal relaxation and increased filling pressure   (grade 2 diastolic dysfunction).  ASSESSMENT  AND PLAN:  1.  History of tachycardia and palpitations: She remains on low-dose metoprolol and states that the symptoms have improved but are not completely eliminated.  Blood pressure is well controlled.  2.  Atypical chest pain: Patient states that she feels right shoulder pain and running worsening dyspnea while running (does charity 5K's).  Stop her walk due to the symptoms.  Pain comes and goes but does not completely go away until she stops and rests altogether.  Going to schedule her for a GXT. If abnormal, may need to consider NM study. EKG revealed NSR without ST -T wave changes. Will check TSH as well.   3. Hypertension:  She is well controlled on triamtrerene-HCTZ. Labs have  been recently drawn on 05/25/2017. Creatinine 0.82 and potassium 4.1.   4. Unknown Lipid Status  Check fasting lipids and LFTs today.  Current medicines are reviewed at length with the patient today.    Labs/ tests ordered today include:  Phill Myron. West Pugh, ANP, AACC   06/14/2017 9:50 AM    [facility] [address], [place], [place] [address] Phone: [phone]; Fax: [phone]

## 2017-06-14 ENCOUNTER — Ambulatory Visit (INDEPENDENT_AMBULATORY_CARE_PROVIDER_SITE_OTHER): Payer: Managed Care, Other (non HMO) | Admitting: Adult Health

## 2017-06-14 ENCOUNTER — Encounter: Payer: Self-pay | Admitting: Adult Health

## 2017-06-14 VITALS — BP 120/76 | HR 51 | Ht 61.0 in | Wt 195.4 lb

## 2017-06-14 DIAGNOSIS — I10 Essential (primary) hypertension: Secondary | ICD-10-CM

## 2017-06-14 DIAGNOSIS — R0789 Other chest pain: Secondary | ICD-10-CM

## 2017-06-14 DIAGNOSIS — Z79899 Other long term (current) drug therapy: Secondary | ICD-10-CM | POA: Diagnosis not present

## 2017-06-14 NOTE — Patient Instructions (Addendum)
Medication Instructions:  NO CHANGES- Your physician recommends that you continue on your current medications as directed. Please refer to the Current Medication list given to you today.  If you need a refill on your cardiac medications before your next appointment, please call your pharmacy.  Labwork: Tsh,flp and lft today HERE IN OUR OFFICE AT [facility]  Take the provided lab slips for you to take with you to the lab for you blood draw.   Testing/Procedures: .Your physician has requested that you have an STRESS TEST. A cardiac stress test is a cardiological test that measures the heart's ability to respond to external stress in a controlled clinical environment. The stress response is induced by exercise (exercise-treadmill)    For further information please visit [email or web address]tn. Please follow instructions below.  How to prepare for your Myocardial Perfusion Test:  Hold: metoprolol  the day of the testing  Do not eat or drink 3 hours prior to your test, except you may have water.  Do not consume products containing caffeine (regular or decaffeinated) 12 hours prior to your test. (ex: coffee, chocolate, sodas, tea).  Do wear comfortable clothes (no dresses or overalls) and walking shoes, tennis shoes preferred (No heels or open toe shoes are allowed).  Do NOT wear cologne, perfume, aftershave, or lotions (deodorant is allowed).  If these instructions are not followed, your test will have to be rescheduled.  If you have questions or concerns about your appointment, you can call the Nuclear Lab at [phone].  Follow-Up: Your physician wants you to follow-up in: 1st available with Dr Stanford Breed, after testing   Thank you for choosing [facility] at [facility]!!

## 2017-06-15 ENCOUNTER — Telehealth: Payer: Self-pay

## 2017-06-15 ENCOUNTER — Encounter: Payer: Self-pay | Admitting: Adult Health

## 2017-06-15 LAB — HEPATIC FUNCTION PANEL
ALK PHOS: 26 IU/L — AB (ref 39–117)
ALT: 12 IU/L (ref 0–32)
AST: 13 IU/L (ref 0–40)
Albumin: 4.4 g/dL (ref 3.5–5.5)
BILIRUBIN, DIRECT: 0.08 mg/dL (ref 0.00–0.40)
Bilirubin Total: 0.3 mg/dL (ref 0.0–1.2)
TOTAL PROTEIN: 7 g/dL (ref 6.0–8.5)

## 2017-06-15 LAB — LIPID PANEL
Chol/HDL Ratio: 3.9 ratio (ref 0.0–4.4)
Cholesterol, Total: 217 mg/dL — ABNORMAL HIGH (ref 100–199)
HDL: 56 mg/dL (ref >39)
LDL Calculated: 143 mg/dL — ABNORMAL HIGH (ref 0–99)
TRIGLYCERIDES: 90 mg/dL (ref 0–149)
VLDL Cholesterol Cal: 18 mg/dL (ref 5–40)

## 2017-06-15 LAB — TSH: TSH: 0.76 u[IU]/mL (ref 0.450–4.500)

## 2017-06-15 NOTE — Telephone Encounter (Signed)
Received call from patient stating wanting to speak with Supervisor. When I got on the phone she was yelling and insistent that our office is not considering her questions/concerns/suggestions related to her care. She was informed that we are doing just that and it is our responsibility to let her know what is recommended by the provider to get her healthy. The patient was insistent that the nurse that called her earlier was rude and would not answer her questions. When I reviewed with her what was charted she stated "when I asked questions about why I was given this medication the nurse told me this is what the provider has ordered and I can refuse to take it". She stated "I am not refusing I just don't see why I need it if diet and exercise are working for me?".Pt told me that she does not have high cholesterol and the provider is just trying to "dope her up to get kick backs from the drug company".   I educated the patient that the provider is not getting a kick back for prescribing her this medication and that she making this recommendation based off of the lab results. The patient stated that these lab results are not showing the whole picture as these are better than in August of 2017, and the provider didn't even look at those results. I educated the patient that these results are not a part of our system but she can mail or fax them to our office is she would like them reviewed. Patient stated "you going to give me gas money for that?" I then informed the patient she can send it via her MyChart, if she has access. The patient did just that while we were on the phone. I apologized for how she felt she was treated during the earlier conversation. I also informed her that I would make sure the provider got a copy of her MyChart message and that I would call her back myself next week with an update, even if the suggestions were the same. She stated "that's fine but you are not addressing the issues". When  asked how she would like things addressed differently she had no suggestions.   On review of the MyChart message, it was noticed that the results were from [place] and we may have access via another system. Though the results are almost 42 years old the patient is addiment that we review them. I accessed those results from 10/2015 and will give to provider to review on her return to the office.

## 2017-06-15 NOTE — Telephone Encounter (Signed)
Returned call to pt to give her KATHRYN LAWRENCE (NURSE PRACTIONIER), DNP recommendation. Pt states that her numbers are "down over 100 point in less than a year" so she does not want to start on medication. Pt states that you are documenting in my chart that I am f####ing non-compliant and going against medical advise. I tried to tell her that I would pass on the message to Morton Amy but she states that "you are acting like I'm lying and I f####ing donot want that in my records" I stated that I would pass on a message to Morton Amy  and see what other recommendations she gives, but could not get a word in with her. She said that this is why I did not want to see a PA. You are putting the wrong information in my chart(this whole time pt is yelling at [place] tell her mam please stop yelling at me, I will forward your message to Morton Amy. (still yelling and using obscenities) tell her, mam please stop yelling at me or I will have to hang up. Well you are yelling at me too, B###ch. At that time I hung up.

## 2017-06-19 NOTE — Telephone Encounter (Signed)
Patient who was inappropriate

## 2017-06-19 NOTE — Telephone Encounter (Signed)
Additional documentation

## 2017-06-19 NOTE — Telephone Encounter (Signed)
I have read over my note. There is no documentation that she has been non-compliant with medications. I am sorry you had to experience that from the patient. May need to consider other options.

## 2017-06-21 ENCOUNTER — Encounter: Payer: Self-pay | Admitting: Cardiology

## 2017-06-21 ENCOUNTER — Other Ambulatory Visit: Payer: Self-pay

## 2017-06-21 DIAGNOSIS — R002 Palpitations: Secondary | ICD-10-CM

## 2017-06-21 MED ORDER — METOPROLOL SUCCINATE ER 50 MG PO TB24
ORAL_TABLET | ORAL | 11 refills | Status: DC
Start: 2017-06-21 — End: 2017-07-11

## 2017-06-21 MED ORDER — TRIAMTERENE-HCTZ 37.5-25 MG PO TABS: 0.5000 | ORAL_TABLET | Freq: Every day | ORAL | 3 refills | Status: DC

## 2017-06-21 NOTE — Telephone Encounter (Addendum)
Called patient to make her aware Dr. Purcell Nails recommendations had not changed after reviewing her lab work from PCP 2017.  She is very upset stated that she would not be "bullied into taking medication that will increase her risk of diabeties". Patient stated "I am not taking that". Expressed to her that this is a recommendation and that she can schedule an appointment to review with the provider. The patient started yelling and using aggressive language stating that I am not listening to her. She stated that we have not send in the RX for her from her office visit and much less this new medication. I informed her that I will review all of this with her if she would like and will resend the Rx to her pharmacy. I started to list her medications she continued to yell and is now using inappropriate language. Instructed her that I will not be berated this way and I am working to get her what she needs. From her medication refills to setting her up with an appointment, she used more inappropriate language and I terminated the call.

## 2017-06-21 NOTE — Telephone Encounter (Signed)
(  [facility]) Patient called to return the phone call of Maudie Mercury, states that she and Maudie Mercury were discussing her medications and she was needing refills. Patient states that kim was rude to her and hung up on her before they could come to any conclusion, and that she has been dealing with this for over a week and still has not received any refills.  I informed the patient that I would send those refills in to [facility], but that was as much knowledge as I had on the situation.  She has an updated OV, and appears to still be on these medications.  Patient continued to speak on how badly she is treated at this office, and how she feels as though "she should write the medical board" because nothing is being done.  I again informed her that all that I could do on my end was try and help her with her refills, and that she should call the [facility] office in order to speak to someone who can further assist her complaint.  Patient verbalized understanding and was gracious that I was helping her with her refills.

## 2017-06-21 NOTE — Telephone Encounter (Signed)
F/U call:  Patient calling states that she is just confused at what to do. I did advise patient that I personally spoke with clinical supervisor and that she would call patient back but it might not be today but that she would return call (as she is in clinic today.)  Patient states that focus should be on palpitations and not on cholesterol. Patient states that she did not mean to upset anyone, she is just confused.

## 2017-06-21 NOTE — Telephone Encounter (Signed)
F/U Call:  Patient returning call. Advised that I will send message to nurse

## 2017-06-22 NOTE — Telephone Encounter (Signed)
Returned patient call from yesterday.   Pt states she feels I misunderstood her when she asked "why" about medication suggestions and the other medications needing refills that I terminated the call. She apologized for her behavior as she is going through a lot at [employer] and does not want our office to be another issue she has to deal with.   The patient wants to know why medications were recommended for her cholesterol when she came in for her palpitations. She wants to know more about how the decision was made about her taking this medication without looking through her records. She stated "I took this medication 15 years ago when it first came out and it did work out well for me". I tried to tell her this is best to discuss during an office visit. Patient stated she does not understand why the nurse can't just ask the provider.   We reviewed that she has an appointment with Dr. Stanford Breed on 4/30 to discuss her stress test results (scheduled on 4/17) and any medication questions in detail.  She agrees but still very upset from previous conversations and interactions with staff as she states again that we all misunderstood her or she misunderstood [place] as she has "auditory processing disease" and cannot hear things well.   The patient then states she called again yesterday afternoon and someone read her the notes in her chart and "they are lies". Our office is discriminating against her for her race, disability, or finances as we are making a horrible effort to be professional and provide her with appropriate care. I tried to express to her that this is not the case and it is unfortunate that she feels this way. The patient asked that I listen to her issues with our office, my response was "no it is not necessary as we have already discussed them at length in previous conversations".  She becomes more upset as we are not answering her questions. The patient requested copies of her notes so she can point out  the lies and find out the names of the persons to report to the medical board. Since I will not give her this information the patient stated "I will contact the medical board to lodge a formal complaint against your practice and you for not apologizing for my tone and poor behavior. This is not the way to treat a patient that is suffering and confused." I apologize she feels this way but we have a plan to make sure she has the care she needs.

## 2017-06-22 NOTE — Telephone Encounter (Signed)
This encounter was created in error - please disregard.

## 2017-06-23 ENCOUNTER — Telehealth (HOSPITAL_COMMUNITY): Payer: Self-pay

## 2017-06-23 ENCOUNTER — Telehealth: Payer: Self-pay

## 2017-06-23 NOTE — Telephone Encounter (Signed)
Documentation per phone call with Director, Everlean Patterson.  Pt was apologetic for previous behavior in addition to expressed she wants to discuss medication in detail with Dr. Stanford Breed at upcoming appointment.

## 2017-06-23 NOTE — Telephone Encounter (Signed)
Encounter complete. 

## 2017-06-28 ENCOUNTER — Ambulatory Visit (HOSPITAL_COMMUNITY)
Admission: RE | Admit: 2017-06-28 | Discharge: 2017-06-28 | Disposition: A | Discharge status: Home / Self Care | Payer: Managed Care, Other (non HMO) | Source: Ambulatory Visit | Attending: Cardiology | Admitting: Cardiology

## 2017-06-28 DIAGNOSIS — R0789 Other chest pain: Secondary | ICD-10-CM | POA: Diagnosis present

## 2017-06-28 LAB — EXERCISE TOLERANCE TEST
CHL CUP MPHR: 179 {beats}/min
Estimated workload: 11.9 METS
Exercise duration (min): 10 min
Exercise duration (sec): 6 s
Peak HR: 173 {beats}/min
Percent HR: 96 %
RPE: 18
Rest HR: 75 {beats}/min

## 2017-06-29 ENCOUNTER — Ambulatory Visit: Payer: Managed Care, Other (non HMO) | Admitting: Physician Assistant

## 2017-07-02 NOTE — Telephone Encounter (Signed)
I agree that she is to discuss these issues and questions with Dr. Stanford Breed on follow up appointment that has been scheduled. I saw her once after not being seen by Dr.Crenshaw in 01/2016, and being seen once by Sharolyn Douglas, NP in 2018. I am incredibly sorry that all of you have had to deal with her behavior and insults. It is best to leave this to administration as they have become involved and are taking this to another level. She is not being forced to take any medications that she does not want to take. I will not engage in or respond to this behavior.   KL

## 2017-07-03 ENCOUNTER — Encounter: Payer: Self-pay | Admitting: Physician Assistant

## 2017-07-03 ENCOUNTER — Ambulatory Visit (INDEPENDENT_AMBULATORY_CARE_PROVIDER_SITE_OTHER): Payer: Managed Care, Other (non HMO) | Admitting: Physician Assistant

## 2017-07-03 VITALS — BP 112/70 | HR 70 | Ht 61.0 in | Wt 200.0 lb

## 2017-07-03 DIAGNOSIS — R12 Heartburn: Secondary | ICD-10-CM | POA: Diagnosis not present

## 2017-07-03 DIAGNOSIS — R152 Fecal urgency: Secondary | ICD-10-CM | POA: Diagnosis not present

## 2017-07-03 DIAGNOSIS — R194 Change in bowel habit: Secondary | ICD-10-CM

## 2017-07-03 DIAGNOSIS — R1030 Lower abdominal pain, unspecified: Secondary | ICD-10-CM | POA: Diagnosis not present

## 2017-07-03 MED ORDER — NA SULFATE-K SULFATE-MG SULF 17.5-3.13-1.6 GM/177ML PO SOLN: 1.0000 | ORAL | 0 refills | Status: DC

## 2017-07-03 MED ORDER — CHOLESTYRAMINE 4 G PO PACK: 4.0000 g | PACK | Freq: Three times a day (TID) | ORAL | 12 refills | Status: DC

## 2017-07-03 MED ORDER — DICYCLOMINE HCL 20 MG PO TABS
20.0000 mg | ORAL_TABLET | Freq: Three times a day (TID) | ORAL | 1 refills | Status: DC
Start: 2017-07-03 — End: 2019-06-21

## 2017-07-03 NOTE — Progress Notes (Addendum)
Chief Complaint: Lower abdominal cramping and loose stools  HPI:    Chelsea Haley is a 42 year old female with a past medical history as listed below, who follows with Dr. Hilarie Fredrickson previously for nausea and returns to clinic today for follow-up of her abdominal pain and diarrhea.    05/25/17 OV, described for 15+ days of severe episodes of lower abdominal cramping with urgent loose stools.  Episodes with nausea and lightheadedness.  Discussed IBS.  Recommend low FODMAP diet, prescribed Dicyclomine 10 mg 3 times daily.  Recommended a probiotic and a food journal.  Encouraged patient to get back on her diet and exercise regimen.  Ordered labs including CBC, CMP and celiac studies.  Labs returned normal.    Today, explains that she has been on the Dicyclomine 10 mg 3 times daily and this has been helping with some of her lower abdominal cramping but she continues with a fecal urgency describing multiple solid bowel movements in a row throughout the day.  Also tried the low FODMAP diet for about 3 weeks and this made no change to her symptoms.  Patient is concerned.    Also describes an increase in reflux symptoms over the past week or so.  Does admit to a large amount of stress in her life is they are moving to a new house and closing tomorrow morning.    Denies fever, chills, blood in her stool, melena or symptoms awakening her from sleep.  Past Medical History:  Diagnosis Date  . Allergy   . Auditory processing disorder   . Binge eating   . Depression   . Endometriosis    a. 2017 s/p endometrial ablation.  . Essential hypertension   . GERD (gastroesophageal reflux disease)   . History of chicken pox   . History of frequent urinary tract infections   . History of kidney stones   . History of seizure disorder    as a child. No medication since age 39.  Marland Kitchen Hyperlipidemia   . Inappropriate sinus tachycardia    a. previously on beta blocker- d/c'd @ time of pregnancy in 2015; b. 09/2011 Echo: EF  60-65%; c. 10/2011 ETT: ex time 71mins, stopped 2/2 fatigue, inadequate HR (beta blocker), no ischemia;  d. 01/2016 Event Monitor - sinus rhythm, pvc's;  e. 02/2016 Echo: 02/2016 EF 55-60%, Gr2 DD.  Marland Kitchen Liver hemangioma    at least 2  . Migraines   . PCOS (polycystic ovarian syndrome)   . PONV (postoperative nausea and vomiting)   . Seizures ([place])    last one age 51, continues to have abnormal EEG's and MRI's    Past Surgical History:  Procedure Laterality Date  . CESAREAN SECTION  2002  . CESAREAN SECTION N/A 02/07/2014   Procedure: CESAREAN SECTION;  Surgeon: Shelly Bombard, MD;  Location: [facility];  Service: Obstetrics;  Laterality: N/A;  . CHOLECYSTECTOMY  2006  . DILITATION & CURRETTAGE/HYSTROSCOPY WITH HYDROTHERMAL ABLATION N/A 07/03/2015   Procedure: DILATATION & CURETTAGE/HYSTEROSCOPY WITH HYDROTHERMAL ABLATION;  Surgeon: Shelly Bombard, MD;  Location: [facility];  Service: Gynecology;  Laterality: N/A;  . Laporoscopy    . PILONIDAL CYST EXCISION  2003  . WISDOM TOOTH EXTRACTION Bilateral     Current Outpatient Medications  Medication Sig Dispense Refill  . dicyclomine (BENTYL) 10 MG capsule Take 1 capsule (10 mg total) by mouth 3 (three) times daily before meals. 90 capsule 3  . metoprolol succinate (TOPROL-XL) 50 MG 24 hr tablet TAKE 1 TABLET BY  MOUTH DAILY WITH OR IMMEDIATELY FOLLOWING A MEAL. 30 tablet 11  . NORETHINDRONE ACET-ETHINYL EST PO Take by mouth daily.    . Probiotic Product (PROBIOTIC ADVANCED PO) Take by mouth.    . triamterene-hydrochlorothiazide (MAXZIDE-25) 37.5-25 MG tablet Take 0.5 tablets by mouth daily. 45 tablet 3   No current facility-administered medications for this visit.     Allergies as of 07/03/2017 - Review Complete 07/03/2017  Allergen Reaction Noted  . Adhesive [tape]  09/20/2011  . Banana  09/20/2011  . Morphine and related Itching 09/20/2011  . Other Itching 09/20/2011  . Pertussis vaccines  09/20/2011  . Tramadol Anxiety 02/28/2012     Family History  Problem Relation Age of Onset  . Arthritis Father   . Hypertension Father   . Diabetes Father   . Heart disease Maternal Grandmother        ICD  . Cancer Maternal Grandfather        lung    Social History   Socioeconomic History  . Marital status: Married    Spouse name: Not on file  . Number of children: 1  . Years of education: Not on file  . Highest education level: Not on file  Occupational History    Employer: STUDENT    Comment: [place] [employer]eds  . Financial resource strain: Not on file  . Food insecurity:    Worry: Not on file    Inability: Not on file  . Transportation needs:    Medical: Not on file    Non-medical: Not on file  Tobacco Use  . Smoking status: Former Smoker    Packs/day: 0.50    Years: 20.00    Pack years: 10.00    Types: Cigarettes    Last attempt to quit: 05/05/2016    Years since quitting: 1.1  . Smokeless tobacco: Never Used  . Tobacco comment: 4 cigarettes daily  Substance and Sexual Activity  . Alcohol use: Yes    Alcohol/week: 0.0 oz    Comment: rare  . Drug use: No  . Sexual activity: Yes    Partners: Male    Birth control/protection: OCP, Pill  Lifestyle  . Physical activity:    Days per week: Not on file    Minutes per session: Not on file  . Stress: Not on file  Relationships  . Social connections:    Talks on phone: Not on file    Gets together: Not on file    Attends religious service: Not on file    Active member of club or organization: Not on file    Attends meetings of clubs or organizations: Not on file    Relationship status: Not on file  . Intimate partner violence:    Fear of current or ex partner: Not on file    Emotionally abused: Not on file    Physically abused: Not on file    Forced sexual activity: Not on file  Other Topics Concern  . 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 Lungs clear to auscultation bilaterally.   No wheezes, crackles, or rhonchi.  Cardiovascular: Normal S1, S2. No MRG. Regular rate and rhythm. No peripheral edema, cyanosis or pallor.  Gastrointestinal:  Soft, nondistended, nontender. No rebound or guarding. Normal bowel sounds. No appreciable masses or hepatomegaly. Psychiatric:  Demonstrates good judgement and reason without abnormal affect or behaviors.  MOST RECENT LABS AND IMAGING: CBC    Component Value Date/Time   WBC 7.4 05/25/2017 0927   RBC 4.64 05/25/2017 0927   HGB 14.0 05/25/2017 0927   HCT 40.2 05/25/2017 0927   PLT 318.0 05/25/2017 0927   MCV 86.7 05/25/2017 0927   MCH 30.5 06/22/2015 1225   MCHC 34.7 05/25/2017 0927   RDW 13.1 05/25/2017 0927   LYMPHSABS 2.1 05/25/2017 0927   MONOABS 0.4 05/25/2017 0927   EOSABS 0.2 05/25/2017 0927   BASOSABS 0.1 05/25/2017 0927    CMP     Component Value Date/Time   NA 136 05/25/2017 0927   K 4.1 05/25/2017 0927   CL 103 05/25/2017 0927   CO2 25 05/25/2017 0927   GLUCOSE 102 (H) 05/25/2017 0927   BUN 18 05/25/2017 0927   CREATININE 0.82 05/25/2017 0927   CREATININE 0.80 01/27/2016 1636   CALCIUM 9.7 05/25/2017 0927   PROT 7.0 06/14/2017 0945   ALBUMIN 4.4 06/14/2017 0945   AST 13 06/14/2017 0945   ALT 12 06/14/2017 0945   ALKPHOS 26 (L) 06/14/2017 0945   BILITOT 0.3 06/14/2017 0945   GFRNONAA >60 06/22/2015 1225   GFRNONAA >89  10/25/2011 0856   GFRAA >60 06/22/2015 1225   GFRAA >89 10/25/2011 0856    Assessment: 1.  Change in bowel habits: Describes fecal urgency, associated with lower abdominal cramping, no help from low FODMAP diet, not relieved with Dicyclomine 2.  Lower abdominal cramping: Some better with Dicyclomine, no better on low FODMAP diet 3.  Heartburn: Very occasional but slightly increased over the past week, consider relation to stress/diet during move  Plan: 1.  Scheduled patient for colonoscopy with Dr[facility].  Did discuss risk, benefits, limitations and alternatives and the patient agrees to proceed. 2.  Increase Dicyclomine to 20 mg 4 times daily, 20-30 minutes before meals and at bedtime #120 with 1 refill 3.  Also prescribed Cholestyramine twice daily. 4.  Recommend patient use over-the-counter Tums or Zantac for occasional heartburn, she will let us know if this gets any more frequent. 5.  Patient will follow in clinic per recommendations from Dr. Hilarie Fredrickson after time of  procedure.  Ellouise Newer, PA-C [facility] 07/03/2017, 2:41 PM  Cc: Dineen Kid, MD   Addendum: Reviewed and agree with management. Pyrtle, Lajuan Lines, MD

## 2017-07-03 NOTE — Patient Instructions (Addendum)
You have been scheduled for a colonoscopy. Please follow written instructions given to you at your visit today.  Please pick up your prep supplies at the pharmacy within the next 1-3 days. If you use inhalers (even only as needed), please bring them with you on the day of your procedure. Your physician has requested that you go to www.startemmi.com and enter the access code given to you at your visit today. This web site gives a general overview about your procedure. However, you should still follow specific instructions given to you by our office regarding your preparation for the procedure.  We have sent the following medications to your pharmacy for you to pick up at your convenience: Cholestyramine 4 mg twice a day   Dicyclomine 20 mg four times a day 20-30 minutes before meals and at bedtime.

## 2017-07-03 NOTE — Progress Notes (Signed)
HPI: FU palpitations and chest pain. Patient has a history of inappropriate sinus tachycardia which is reasonably well controlled with Toprol. Event monitor repeated November 2017 and showed sinus rhythm with PVCs.  Echocardiogram December 2017 showed normal LV function.  Exercise treadmill April 2019 negative.  Lipid profile April 2019 showed a total cholesterol of 217 with LDL 143.  Since last seen, pt with multiple complaints about nursing staff today.  She has some dyspnea on exertion but no orthopnea or PND.  Minimal pedal edema.  She has pain in her chin and shoulder when she runs in the cold.  She has palpitations that are mildly improved with beta-blockade.  She has not had syncope.   Current Outpatient Medications  Medication Sig Dispense Refill  . cholestyramine (QUESTRAN) 4 g packet Take 1 packet (4 g total) by mouth 3 (three) times daily with meals. 60 each 12  . dicyclomine (BENTYL) 20 MG tablet Take 1 tablet (20 mg total) by mouth 4 (four) times daily -  before meals and at bedtime. 120 tablet 1  . metoprolol succinate (TOPROL-XL) 50 MG 24 hr tablet TAKE 1 TABLET BY MOUTH DAILY WITH OR IMMEDIATELY FOLLOWING A MEAL. 30 tablet 11  . Na Sulfate-K Sulfate-Mg Sulf (SUPREP BOWEL PREP KIT) 17.5-3.13-1.6 GM/177ML SOLN Take 1 kit by mouth as directed. 324 mL 0  . NORETHINDRONE ACET-ETHINYL EST PO Take by mouth daily.    . Probiotic Product (PROBIOTIC ADVANCED PO) Take by mouth.    . triamterene-hydrochlorothiazide (MAXZIDE-25) 37.5-25 MG tablet Take 0.5 tablets by mouth daily. 45 tablet 3   No current facility-administered medications for this visit.      Past Medical History:  Diagnosis Date  . Allergy   . Auditory processing disorder   . Binge eating   . Depression   . Endometriosis    a. 2017 s/p endometrial ablation.  . Essential hypertension   . GERD (gastroesophageal reflux disease)   . History of chicken pox   . History of frequent urinary tract infections   .  History of kidney stones   . History of seizure disorder    as a child. No medication since age 55.  Marland Kitchen Hyperlipidemia   . Inappropriate sinus tachycardia    a. previously on beta blocker- d/c'd @ time of pregnancy in 2015; b. 09/2011 Echo: EF 60-65%; c. 10/2011 ETT: ex time 33mns, stopped 2/2 fatigue, inadequate HR (beta blocker), no ischemia;  d. 01/2016 Event Monitor - sinus rhythm, pvc's;  e. 02/2016 Echo: 02/2016 EF 55-60%, Gr2 DD.  .Marland KitchenLiver hemangioma    at least 2  . Migraines   . PCOS (polycystic ovarian syndrome)   . PONV (postoperative nausea and vomiting)   . Seizures ([facility]    last one age 42 continues to have abnormal EEG's and MRI's    Past Surgical History:  Procedure Laterality Date  . CESAREAN SECTION  2002  . CESAREAN SECTION N/A 02/07/2014   Procedure: CESAREAN SECTION;  Surgeon: CShelly Bombard MD;  [facility];  Service: Obstetrics;  Laterality: N/A;  . CHOLECYSTECTOMY  2006  . DILITATION & CURRETTAGE/HYSTROSCOPY WITH HYDROTHERMAL ABLATION N/A 07/03/2015   Procedure: DILATATION & CURETTAGE/HYSTEROSCOPY WITH HYDROTHERMAL ABLATION;  Surgeon: CShelly Bombard MD;  [facility];  Service: Gynecology;  Laterality: N/A;  . Laporoscopy    . PILONIDAL CYST EXCISION  2003  . WISDOM TOOTH EXTRACTION Bilateral     Social History   Socioeconomic History  . Marital  status: Married    Spouse name: Not on file  . Number of children: 1  . Years of education: Not on file  . Highest education level: Not on file  Occupational History    Employer: STUDENT    Comment: [place] Needs  . Financial resource strain: Not on file  . Food insecurity:    Worry: Not on file    Inability: Not on file  . Transportation needs:    Medical: Not on file    Non-medical: Not on file  Tobacco Use  . Smoking status: Former Smoker    Packs/day: 0.50    Years: 20.00    Pack years: 10.00    Types: Cigarettes    Last attempt to quit: 05/05/2016    Years since  quitting: 1.1  . Smokeless tobacco: Never Used  . Tobacco comment: 4 cigarettes daily  Substance and Sexual Activity  . Alcohol use: Yes    Alcohol/week: 0.0 oz    Comment: rare  . Drug use: No  . Sexual activity: Yes    Partners: Male    Birth control/protection: OCP, Pill  Lifestyle  . Physical activity:    Days per week: Not on file    Minutes per session: Not on file  . Stress: Not on file  Relationships  . Social connections:    Talks on phone: Not on file    Gets together: Not on file    Attends religious service: Not on file    Active member of club or organization: Not on file    Attends meetings of clubs or organizations: Not on file    Relationship status: Not on file  . Intimate partner violence:    Fear of current or ex partner: Not on file    Emotionally abused: Not on file    Physically abused: Not on file    Forced sexual activity: Not on file  Other Topics Concern  . 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 Arthritis Father   . Hypertension Father   . Diabetes Father   . Heart disease Maternal Grandmother        ICD  . Cancer Maternal Grandfather        lung    ROS: no fevers or chills, productive cough, hemoptysis, dysphasia, odynophagia, melena, hematochezia, dysuria, hematuria, rash, seizure activity, orthopnea, PND, pedal edema, claudication. Remaining systems are negative.  Physical Exam: Well-developed well-nourished in no acute distress.  Skin is warm and dry.  HEENT is normal.  Neck is supple.  Chest is clear to auscultation with normal expansion.  Cardiovascular exam is regular rate and rhythm.  Abdominal exam nontender or distended. No masses palpated. Extremities show no edema. neuro grossly intact  ECG- 06/14/17-Sinus bradycardia  with no ST changes; personally reviewed  A/P  1 inappropriate sinus tachycardia/palpitations-patient continues to have occasional palpitations.  Previous monitor showed sinus with PVCs.  I will increase Toprol to 75 mg daily.  Note LV function is normal.  2 hypertension-blood pressure is mildly elevated today.  Increase Toprol to 75 mg daily and follow.  She feels that some of elevation is secondary to being upset in the office today.  3 hyperlipidemia-patient would prefer to avoid statins.  We will plan to begin low-cholesterol diet for 3 months and then repeat lipids.  Further therapy based on follow-up results.  4 atypical chest pain-recent treadmill is negative.  Kirk Ruths, MD

## 2017-07-11 ENCOUNTER — Encounter: Payer: Self-pay | Admitting: Cardiology

## 2017-07-11 ENCOUNTER — Ambulatory Visit (INDEPENDENT_AMBULATORY_CARE_PROVIDER_SITE_OTHER): Payer: Managed Care, Other (non HMO) | Admitting: Cardiology

## 2017-07-11 VITALS — BP 160/90 | HR 96 | Ht 61.0 in | Wt 202.0 lb

## 2017-07-11 DIAGNOSIS — E7800 Pure hypercholesterolemia, unspecified: Secondary | ICD-10-CM

## 2017-07-11 DIAGNOSIS — I10 Essential (primary) hypertension: Secondary | ICD-10-CM

## 2017-07-11 DIAGNOSIS — R002 Palpitations: Secondary | ICD-10-CM | POA: Diagnosis not present

## 2017-07-11 MED ORDER — METOPROLOL SUCCINATE ER 25 MG PO TB24: 75.0000 mg | ORAL_TABLET | Freq: Every day | ORAL | 3 refills | Status: DC

## 2017-07-11 NOTE — Patient Instructions (Signed)
Medication Instructions:   INCREASE METOPROLOL TO 75 MG ONCE DAILY= 1 AND 1/2 OF 50 MG TABLET ONCE DAILY AND 3 OF THE 25 MG TABLETS ONCE DAILY  Labwork:  Your physician recommends that you return for lab work in: [place]:  Your physician wants you to follow-up in: [place] will receive a reminder letter in the mail two months in advance. If you don't receive a letter, please call our office to schedule the follow-up appointment.   If you need a refill on your cardiac medications before your next appointment, please call your pharmacy.

## 2017-07-31 ENCOUNTER — Encounter: Payer: Managed Care, Other (non HMO) | Admitting: Internal Medicine

## 2017-08-14 ENCOUNTER — Encounter: Payer: Self-pay | Admitting: Internal Medicine

## 2017-08-14 ENCOUNTER — Ambulatory Visit (AMBULATORY_SURGERY_CENTER): Payer: Managed Care, Other (non HMO) | Admitting: Internal Medicine

## 2017-08-14 VITALS — BP 133/59 | HR 52 | Temp 98.0°F | Resp 15 | Ht 61.0 in | Wt 200.0 lb

## 2017-08-14 DIAGNOSIS — R194 Change in bowel habit: Secondary | ICD-10-CM

## 2017-08-14 DIAGNOSIS — R152 Fecal urgency: Secondary | ICD-10-CM

## 2017-08-14 DIAGNOSIS — R197 Diarrhea, unspecified: Secondary | ICD-10-CM

## 2017-08-14 MED ORDER — SODIUM CHLORIDE 0.9 % IV SOLN: 500.0000 mL | Freq: Once | INTRAVENOUS | Status: AC

## 2017-08-14 NOTE — Progress Notes (Signed)
Called to room to assist during endoscopic procedure.  Patient ID and intended procedure confirmed with present staff. Received instructions for my participation in the procedure from the performing physician.  

## 2017-08-14 NOTE — Progress Notes (Signed)
To PACU, VSS. Report to [email or web address] 

## 2017-08-14 NOTE — Patient Instructions (Addendum)
Impression/Recommendations:  Diverticulosis handout given to patient.  Resume previous diet. Continue present medications.  Begin 1 Tablespoon of Benefiber daily. Continue Bentyl (dicyclomine) 20 mg. 3 times daily for lower abdominal cramping.  Repeat colonoscopy in 10 years for screening.  See Dr. Hilarie Fredrickson in office in August 2019.   YOU HAD AN ENDOSCOPIC PROCEDURE TODAY AT [facility]:   Refer to the procedure report that was given to you for any specific questions about what was found during the examination.  If the procedure report does not answer your questions, please call your gastroenterologist to clarify.  If you requested that your care partner not be given the details of your procedure findings, then the procedure report has been included in a sealed envelope for you to review at your convenience later.  YOU SHOULD EXPECT: Some feelings of bloating in the abdomen. Passage of more gas than usual.  Walking can help get rid of the air that was put into your GI tract during the procedure and reduce the bloating. If you had a lower endoscopy (such as a colonoscopy or flexible sigmoidoscopy) you may notice spotting of blood in your stool or on the toilet paper. If you underwent a bowel prep for your procedure, you may not have a normal bowel movement for a few days.  Please Note:  You might notice some irritation and congestion in your nose or some drainage.  This is from the oxygen used during your procedure.  There is no need for concern and it should clear up in a day or so.  SYMPTOMS TO REPORT IMMEDIATELY:   Following lower endoscopy (colonoscopy or flexible sigmoidoscopy):  Excessive amounts of blood in the stool  Significant tenderness or worsening of abdominal pains  Swelling of the abdomen that is new, acute  Fever of 100F or higher For urgent or emergent issues, a gastroenterologist can be reached at any hour by calling [phone].   DIET:  We do  recommend a small meal at first, but then you may proceed to your regular diet.  Drink plenty of fluids but you should avoid alcoholic beverages for 24 hours.  ACTIVITY:  You should plan to take it easy for the rest of today and you should NOT DRIVE or use heavy machinery until tomorrow (because of the sedation medicines used during the test).    FOLLOW UP: Our staff will call the number listed on your records the next business day following your procedure to check on you and address any questions or concerns that you may have regarding the information given to you following your procedure. If we do not reach you, we will leave a message.  However, if you are feeling well and you are not experiencing any problems, there is no need to return our call.  We will assume that you have returned to your regular daily activities without incident.  If any biopsies were taken you will be contacted by phone or by letter within the next 1-3 weeks.  Please call us at [phone] if you have not heard about the biopsies in 3 weeks.    SIGNATURES/CONFIDENTIALITY: You and/or your care partner have signed paperwork which will be entered into your electronic medical record.  These signatures attest to the fact that that the information above on your After Visit Summary has been reviewed and is understood.  Full responsibility of the confidentiality of this discharge information lies with you and/or your care-partner.

## 2017-08-14 NOTE — Progress Notes (Signed)
Pt's states no medical or surgical changes since previsit or office visit. 

## 2017-08-14 NOTE — Op Note (Signed)
[facility] Patient Name: Chelsea Haley Procedure Date: 08/14/2017 2:07 PM MRN: [id number] Endoscopist: Jerene Bears , MD Age: 42 Referring MD:  Date of Birth: [birth year] Gender: Female Account #: [id number] Procedure:                Colonoscopy Indications:              Pelvic pain, Lower abdominal pain, Clinically                            significant loose stools, Change in bowel habits,                            Fecal urgency/incontinence Medicines:                Monitored Anesthesia Care Procedure:                Pre-Anesthesia Assessment:                           - Prior to the procedure, a History and Physical                            was performed, and patient medications and                            allergies were reviewed. The patient's tolerance of                            previous anesthesia was also reviewed. The risks                            and benefits of the procedure and the sedation                            options and risks were discussed with the patient.                            All questions were answered, and informed consent                            was obtained. Prior Anticoagulants: The patient has                            taken no previous anticoagulant or antiplatelet                            agents. ASA Grade Assessment: II - A patient with                            mild systemic disease. After reviewing the risks                            and benefits, the patient was deemed in  satisfactory condition to undergo the procedure.                           After obtaining informed consent, the colonoscope                            was passed under direct vision. Throughout the                            procedure, the patient's blood pressure, pulse, and                            oxygen saturations were monitored continuously. The                            Colonoscope was introduced through the  anus and                            advanced to the terminal ileum. The colonoscopy was                            performed without difficulty. The patient tolerated                            the procedure well. The quality of the bowel                            preparation was excellent. The terminal ileum,                            ileocecal valve, appendiceal orifice, and rectum                            were photographed. Scope In: 2:15:57 PM Scope Out: 2:29:29 PM Scope Withdrawal Time: 0 hours 8 minutes 29 seconds  Total Procedure Duration: 0 hours 13 minutes 32 seconds  Findings:                 The digital rectal exam was normal.                           The terminal ileum appeared normal.                           A few small-mouthed diverticula were found in the                            hepatic flexure.                           Normal mucosa was found in the entire colon.                            Biopsies for histology were taken with a cold  forceps from the right colon and left colon for                            evaluation of microscopic colitis.                           The retroflexed view of the distal rectum and anal                            verge was normal and showed no anal or rectal                            abnormalities. Complications:            No immediate complications. Estimated Blood Loss:     Estimated blood loss was minimal. Impression:               - The examined portion of the ileum was normal.                           - Diverticulosis at the hepatic flexure.                           - Normal mucosa in the entire examined colon.                            Biopsied.                           - The distal rectum and anal verge are normal on                            retroflexion view. Recommendation:           - Patient has a contact number available for                            emergencies. The signs and  symptoms of potential                            delayed complications were discussed with the                            patient. Return to normal activities tomorrow.                            Written discharge instructions were provided to the                            patient.                           - Resume previous diet.                           - Continue present medications.                           -  Begin Benefiber 1 tablespoon daily. Can continue                            with Bentyl (dicyclomine) 20 mg 3 times daily for                            lower abdominal cramping pain.                           - Repeat colonoscopy in 10 years for screening                            purposes. Jerene Bears, MD 08/14/2017 2:37:09 PM This report has been signed electronically.

## 2017-08-15 ENCOUNTER — Telehealth: Payer: Self-pay | Admitting: *Deleted

## 2017-08-15 NOTE — Telephone Encounter (Signed)
  Follow up Call-  Call back number 08/14/2017  Post procedure Call Back phone  # [phone]  Permission to leave phone message Yes  Some recent data might be hidden     Patient questions:  Do you have a fever, pain , or abdominal swelling? No. Pain Score  0 *  Have you tolerated food without any problems? Yes.    Have you been able to return to your normal activities? Yes.    Do you have any questions about your discharge instructions: Diet   No. Medications  No. Follow up visit  No.  Do you have questions or concerns about your Care? No.  Actions: * If pain score is 4 or above: No action needed, pain <4.

## 2017-08-16 ENCOUNTER — Other Ambulatory Visit: Payer: Self-pay | Admitting: Physician Assistant

## 2017-08-21 ENCOUNTER — Encounter: Payer: Self-pay | Admitting: Internal Medicine

## 2017-10-20 ENCOUNTER — Encounter: Payer: Self-pay | Admitting: *Deleted

## 2017-10-28 LAB — LIPID PANEL
Chol/HDL Ratio: 4.3 ratio (ref 0.0–4.4)
Cholesterol, Total: 246 mg/dL — ABNORMAL HIGH (ref 100–199)
HDL: 57 mg/dL (ref >39)
LDL CALC: 160 mg/dL — AB (ref 0–99)
TRIGLYCERIDES: 147 mg/dL (ref 0–149)
VLDL Cholesterol Cal: 29 mg/dL (ref 5–40)

## 2017-10-31 ENCOUNTER — Telehealth: Payer: Self-pay | Admitting: *Deleted

## 2017-10-31 DIAGNOSIS — E7800 Pure hypercholesterolemia, unspecified: Secondary | ICD-10-CM

## 2017-10-31 MED ORDER — ROSUVASTATIN CALCIUM 20 MG PO TABS: 20.0000 mg | ORAL_TABLET | Freq: Every day | ORAL | 3 refills | Status: DC

## 2017-10-31 NOTE — Telephone Encounter (Addendum)
Results released to my chart  New script sent to the pharmacy and Lab orders mailed to the pt   ----- Message from Lelon Perla, MD sent at 10/28/2017  9:20 AM EDT ----- LDL remains elevated; would begin crestor 20 mg daily if pt agreeable; lipids and liver 6 weeks later Kirk Ruths

## 2017-12-27 LAB — COMPREHENSIVE METABOLIC PANEL
ALBUMIN: 4.2 g/dL (ref 3.5–5.5)
ALT: 11 IU/L (ref 0–32)
AST: 14 IU/L (ref 0–40)
Albumin/Globulin Ratio: 1.7 (ref 1.2–2.2)
Alkaline Phosphatase: 29 IU/L — ABNORMAL LOW (ref 39–117)
BILIRUBIN TOTAL: 0.2 mg/dL (ref 0.0–1.2)
BUN / CREAT RATIO: 16 (ref 9–23)
BUN: 12 mg/dL (ref 6–24)
CO2: 20 mmol/L (ref 20–29)
CREATININE: 0.74 mg/dL (ref 0.57–1.00)
Calcium: 9.2 mg/dL (ref 8.7–10.2)
Chloride: 101 mmol/L (ref 96–106)
GFR calc Af Amer: 116 mL/min/{1.73_m2} (ref >59)
GFR calc non Af Amer: 101 mL/min/{1.73_m2} (ref >59)
GLOBULIN, TOTAL: 2.5 g/dL (ref 1.5–4.5)
GLUCOSE: 87 mg/dL (ref 65–99)
Potassium: 4.3 mmol/L (ref 3.5–5.2)
SODIUM: 138 mmol/L (ref 134–144)
Total Protein: 6.7 g/dL (ref 6.0–8.5)

## 2017-12-27 LAB — LIPID PANEL
CHOLESTEROL TOTAL: 133 mg/dL (ref 100–199)
Chol/HDL Ratio: 2.5 ratio (ref 0.0–4.4)
HDL: 53 mg/dL (ref >39)
LDL CALC: 63 mg/dL (ref 0–99)
Triglycerides: 87 mg/dL (ref 0–149)
VLDL Cholesterol Cal: 17 mg/dL (ref 5–40)

## 2018-05-14 ENCOUNTER — Ambulatory Visit (INDEPENDENT_AMBULATORY_CARE_PROVIDER_SITE_OTHER): Payer: Managed Care, Other (non HMO) | Admitting: Obstetrics

## 2018-05-14 ENCOUNTER — Encounter: Payer: Self-pay | Admitting: Obstetrics

## 2018-05-14 VITALS — BP 130/82 | HR 64 | Ht 61.0 in | Wt 221.7 lb

## 2018-05-14 DIAGNOSIS — Z1151 Encounter for screening for human papillomavirus (HPV): Secondary | ICD-10-CM | POA: Diagnosis not present

## 2018-05-14 DIAGNOSIS — Z6841 Body Mass Index (BMI) 40.0 and over, adult: Secondary | ICD-10-CM

## 2018-05-14 DIAGNOSIS — Z3041 Encounter for surveillance of contraceptive pills: Secondary | ICD-10-CM

## 2018-05-14 DIAGNOSIS — Z1239 Encounter for other screening for malignant neoplasm of breast: Secondary | ICD-10-CM

## 2018-05-14 DIAGNOSIS — Z01419 Encounter for gynecological examination (general) (routine) without abnormal findings: Secondary | ICD-10-CM | POA: Diagnosis not present

## 2018-05-14 DIAGNOSIS — N898 Other specified noninflammatory disorders of vagina: Secondary | ICD-10-CM

## 2018-05-14 DIAGNOSIS — Z124 Encounter for screening for malignant neoplasm of cervix: Secondary | ICD-10-CM

## 2018-05-14 DIAGNOSIS — Z9889 Other specified postprocedural states: Secondary | ICD-10-CM

## 2018-05-14 DIAGNOSIS — Z113 Encounter for screening for infections with a predominantly sexual mode of transmission: Secondary | ICD-10-CM | POA: Diagnosis not present

## 2018-05-14 DIAGNOSIS — F325 Major depressive disorder, single episode, in full remission: Secondary | ICD-10-CM

## 2018-05-14 DIAGNOSIS — E66813 Obesity, class 3: Secondary | ICD-10-CM

## 2018-05-14 NOTE — Progress Notes (Signed)
Subjective:        Chelsea Haley is a 43 y.o. female here for a routine exam.  Current complaints: None.    Personal health questionnaire:  Is patient Chelsea Haley, have a family history of breast and/or ovarian cancer: no Is there a family history of uterine cancer diagnosed at age < 67, gastrointestinal cancer, urinary tract cancer, family member who is a Field seismologist syndrome-associated carrier: no Is the patient overweight and hypertensive, family history of diabetes, personal history of gestational diabetes, preeclampsia or PCOS: no Is patient over 41, have PCOS,  family history of premature CHD under age 62, diabetes, smoke, have hypertension or peripheral artery disease:  no At any time, has a partner hit, kicked or otherwise hurt or frightened you?: no Over the past 2 weeks, have you felt down, depressed or hopeless?: no Over the past 2 weeks, have you felt little interest or pleasure in doing things?:no   Gynecologic History No LMP recorded (lmp unknown). (Menstrual status: Oral contraceptives). Contraception: OCP (estrogen/progesterone) Last Pap: 2015. Results were: normal Last mammogram: unknown. Results were: unknown  Obstetric History OB History  Gravida Para Term Preterm AB Living  2 2 1 1  0 2  SAB TAB Ectopic Multiple Live Births  0 0 0 0 2    # Outcome Date GA Lbr Len/2nd Weight Sex Delivery Anes PTL Lv  2 Term 02/07/14 [redacted]w[redacted]d  5 lb 8.5 oz (2.509 kg) F CS-LTranv Spinal  LIV     Birth Comments: features of Down's syndrome, [redacted] wks EGA  1 Preterm 02/06/01 [redacted]w[redacted]d  5 lb (2.268 kg) M CS-LTranv EPI N LIV    Past Medical History:  Diagnosis Date  . Allergy   . Auditory processing disorder   . Binge eating   . Depression   . Endometriosis    a. 2017 s/p endometrial ablation.  . Essential hypertension   . GERD (gastroesophageal reflux disease)   . History of chicken pox   . History of frequent urinary tract infections   . History of kidney stones   . History of  seizure disorder    as a child. No medication since age 39.  Marland Kitchen Hyperlipidemia   . Inappropriate sinus tachycardia    a. previously on beta blocker- d/c'd @ time of pregnancy in 2015; b. 09/2011 Echo: EF 60-65%; c. 10/2011 ETT: ex time 97mins, stopped 2/2 fatigue, inadequate HR (beta blocker), no ischemia;  d. 01/2016 Event Monitor - sinus rhythm, pvc's;  e. 02/2016 Echo: 02/2016 EF 55-60%, Gr2 DD.  Marland Kitchen Liver hemangioma    at least 2  . Migraines   . PCOS (polycystic ovarian syndrome)   . PONV (postoperative nausea and vomiting)   . Seizures ([name])    last one age 56, continues to have abnormal EEG's and MRI's    Past Surgical History:  Procedure Laterality Date  . CESAREAN SECTION  2002  . CESAREAN SECTION N/A 02/07/2014   Procedure: CESAREAN SECTION;  Surgeon: Shelly Bombard, MD;  Location: [facility];  Service: Obstetrics;  Laterality: N/A;  . CHOLECYSTECTOMY  2006  . DILITATION & CURRETTAGE/HYSTROSCOPY WITH HYDROTHERMAL ABLATION N/A 07/03/2015   Procedure: DILATATION & CURETTAGE/HYSTEROSCOPY WITH HYDROTHERMAL ABLATION;  Surgeon: Shelly Bombard, MD;  Location: [facility];  Service: Gynecology;  Laterality: N/A;  . Laporoscopy    . PILONIDAL CYST EXCISION  2003  . WISDOM TOOTH EXTRACTION Bilateral      Current Outpatient Medications:  .  cholestyramine (QUESTRAN) 4 g packet, Take  1 packet (4 g total) by mouth 3 (three) times daily with meals., Disp: 60 each, Rfl: 12 .  dicyclomine (BENTYL) 10 MG capsule, TAKE 1 CAPSULE (10 MG TOTAL) BY MOUTH 3 (THREE) TIMES DAILY BEFORE MEALS., Disp: 90 capsule, Rfl: 2 .  dicyclomine (BENTYL) 20 MG tablet, Take 1 tablet (20 mg total) by mouth 4 (four) times daily -  before meals and at bedtime., Disp: 120 tablet, Rfl: 1 .  metoprolol succinate (TOPROL-XL) 25 MG 24 hr tablet, Take 3 tablets (75 mg total) by mouth daily. TAKE 1 TABLET BY MOUTH DAILY WITH OR IMMEDIATELY FOLLOWING A MEAL., Disp: 270 tablet, Rfl: 3 .  NORETHINDRONE ACET-ETHINYL EST PO, Take by mouth  daily., Disp: , Rfl:  .  Probiotic Product (PROBIOTIC ADVANCED PO), Take by mouth., Disp: , Rfl:  .  rosuvastatin (CRESTOR) 20 MG tablet, Take 1 tablet (20 mg total) by mouth daily., Disp: 90 tablet, Rfl: 3 .  triamterene-hydrochlorothiazide (MAXZIDE-25) 37.5-25 MG tablet, Take 0.5 tablets by mouth daily., Disp: 45 tablet, Rfl: 3  Current Facility-Administered Medications:  .  0.9 %  sodium chloride infusion, 500 mL, Intravenous, Once, Pyrtle, Lajuan Lines, MD Allergies  Allergen Reactions  . Adhesive [Tape]     Skin sensitivity  . Banana     Migraine, if they are cooked or over ripe.   . Morphine And Related Itching    hallucinations  . Other Itching    WALNUTS.  Gums itch. Cashews.   . Pertussis Vaccines     seizures  . Tramadol Anxiety    Social History   Tobacco Use  . Smoking status: Former Smoker    Packs/day: 0.50    Years: 20.00    Pack years: 10.00    Types: Cigarettes    Last attempt to quit: 05/05/2016    Years since quitting: 2.0  . Smokeless tobacco: Never Used  . Tobacco comment: 4 cigarettes daily  Substance Use Topics  . Alcohol use: Yes    Alcohol/week: 0.0 standard drinks    Comment: rare    Family History  Problem Relation Age of Onset  . Arthritis Father   . Hypertension Father   . Diabetes Father   . Heart disease Maternal Grandmother        ICD  . Cancer Maternal Grandfather        lung      Review of Systems  Constitutional: negative for fatigue and weight loss Respiratory: negative for cough and wheezing Cardiovascular: negative for chest pain, fatigue and palpitations Gastrointestinal: negative for abdominal pain and change in bowel habits Musculoskeletal:negative for myalgias Neurological: negative for gait problems and tremors Behavioral/Psych: negative for abusive relationship, depression Endocrine: negative for temperature intolerance    Genitourinary:negative for abnormal menstrual periods, genital lesions, hot flashes, sexual problems  and vaginal discharge Integument/breast: negative for breast lump, breast tenderness, nipple discharge and skin lesion(s)    Objective:       BP 130/82   Pulse 64   Ht 5\' 1"  (1.549 m)   Wt 221 lb 11.2 oz (100.6 kg)   LMP  (LMP Unknown)   BMI 41.89 kg/m  General:   alert  Skin:   no rash or abnormalities  Lungs:   clear to auscultation bilaterally  Heart:   regular rate and rhythm, S1, S2 normal, no murmur, click, rub or gallop  Breasts:   normal without suspicious masses, skin or nipple changes or axillary nodes  Abdomen:  normal findings: no organomegaly, soft, non-tender  and no hernia  Pelvis:  External genitalia: normal general appearance Urinary system: urethral meatus normal and bladder without fullness, nontender Vaginal: normal without tenderness, induration or masses Cervix: normal appearance Adnexa: normal bimanual exam Uterus: anteverted and non-tender, normal size   Lab Review Urine pregnancy test Labs reviewed yes Radiologic studies reviewed yes  50% of 20 min visit spent on counseling and coordination of care.   Assessment:     1. Encounter for routine gynecological examination with Papanicolaou smear of cervix Rx: - Cytology - PAP( ) - Cervicovaginal ancillary only  2. Screening breast examination Rx: - MM Digital Screening; Future  3. Status post endometrial ablation - doing well.  Amenorrhea.  4. Encounter for surveillance of contraceptive pills - doing well  5. Class 3 severe obesity due to excess calories without serious comorbidity with body mass index (BMI) of 40.0 to 44.9 in adult [facility]) - program of caloric reduction, exercise and behavioral modification recommended  6. Major depressive disorder in remission, unspecified whether recurrent ([place]) - stable, on meds      Plan:    Education reviewed: calcium supplements, depression evaluation, low fat, low cholesterol diet, safe sex/STD prevention, self breast exams, skin cancer  screening and weight bearing exercise. Contraception: OCP (estrogen/progesterone). Mammogram ordered. Follow up in: 1 year.   No orders of the defined types were placed in this encounter.  Orders Placed This Encounter  Procedures  . MM Digital Screening    INS-CIGNA ANNUAL/PF 25 YEARS AGO-UNABLE TO OBTAIN//NO PROBLEMS/NO NEEDS/2D/SB W/ANITRA    Standing Status:   Future    Standing Expiration Date:   07/14/2019    Order Specific Question:   Reason for Exam (SYMPTOM  OR DIAGNOSIS REQUIRED)    Answer:   Screening    Order Specific Question:   Is the patient pregnant?    Answer:   No    Order Specific Question:   Preferred imaging location?    Answer:   [facility]    Shelly Bombard MD 05-14-2018

## 2018-05-14 NOTE — Progress Notes (Signed)
Presents for [facility]EX. She wants PAP to be done today.

## 2018-05-15 LAB — CYTOLOGY - PAP
Adequacy: ABSENT
DIAGNOSIS: NEGATIVE
HPV (WINDOPATH): NOT DETECTED

## 2018-05-17 ENCOUNTER — Other Ambulatory Visit: Payer: Self-pay | Admitting: Obstetrics

## 2018-05-17 DIAGNOSIS — A549 Gonococcal infection, unspecified: Secondary | ICD-10-CM

## 2018-05-17 DIAGNOSIS — A749 Chlamydial infection, unspecified: Secondary | ICD-10-CM

## 2018-05-17 LAB — CERVICOVAGINAL ANCILLARY ONLY
Bacterial vaginitis: NEGATIVE
CHLAMYDIA, DNA PROBE: POSITIVE — AB
NEISSERIA GONORRHEA: NEGATIVE
Trichomonas: NEGATIVE

## 2018-05-17 MED ORDER — CEFIXIME 400 MG PO CAPS: 400.0000 mg | ORAL_CAPSULE | Freq: Once | ORAL | 0 refills | Status: AC

## 2018-05-17 MED ORDER — AZITHROMYCIN 500 MG PO TABS: 1000.0000 mg | ORAL_TABLET | Freq: Once | ORAL | 0 refills | Status: AC

## 2018-05-23 NOTE — Telephone Encounter (Signed)
Called patient to discuss her MyChart message.  Patient described how she was spoken to regarding her +CT results.  This was not handled well and will be addressed.  Patient's partner was tested and did have a positive result.  She has completed her antibiotics and has scheduled her TOC appointment.

## 2018-06-06 ENCOUNTER — Ambulatory Visit (INDEPENDENT_AMBULATORY_CARE_PROVIDER_SITE_OTHER): Payer: Managed Care, Other (non HMO)

## 2018-06-06 ENCOUNTER — Other Ambulatory Visit: Payer: Self-pay

## 2018-06-06 VITALS — Wt 224.6 lb

## 2018-06-06 DIAGNOSIS — Z113 Encounter for screening for infections with a predominantly sexual mode of transmission: Secondary | ICD-10-CM

## 2018-06-06 DIAGNOSIS — A749 Chlamydial infection, unspecified: Secondary | ICD-10-CM

## 2018-06-06 NOTE — Progress Notes (Signed)
Presents for [facility] for +GH. Partner was also treated. Self Swab was done, specimen sent to Lab.   Last PAP 05/14/2018

## 2018-06-07 ENCOUNTER — Ambulatory Visit: Payer: Managed Care, Other (non HMO)

## 2018-06-07 LAB — CERVICOVAGINAL ANCILLARY ONLY
CHLAMYDIA, DNA PROBE: NEGATIVE
Neisseria Gonorrhea: NEGATIVE

## 2018-06-21 ENCOUNTER — Other Ambulatory Visit: Payer: Self-pay

## 2018-06-21 ENCOUNTER — Telehealth: Payer: Self-pay

## 2018-06-21 ENCOUNTER — Other Ambulatory Visit: Payer: Self-pay | Admitting: Obstetrics

## 2018-06-21 DIAGNOSIS — Z3041 Encounter for surveillance of contraceptive pills: Secondary | ICD-10-CM

## 2018-06-21 MED ORDER — NORETHINDRONE-ETH ESTRADIOL 1-35 MG-MCG PO TABS: ORAL_TABLET | ORAL | 3 refills | Status: DC

## 2018-06-21 NOTE — Progress Notes (Signed)
error 

## 2018-06-21 NOTE — Telephone Encounter (Signed)
Patient would like to know if she can have a refill of her Alaycen birth control pills (3 month supply).

## 2018-06-21 NOTE — Telephone Encounter (Signed)
Alaycen Rx

## 2018-06-21 NOTE — Progress Notes (Signed)
Error

## 2018-07-09 ENCOUNTER — Other Ambulatory Visit: Payer: Self-pay

## 2018-07-09 MED ORDER — TRIAMTERENE-HCTZ 37.5-25 MG PO TABS: 0.5000 | ORAL_TABLET | Freq: Every day | ORAL | 3 refills | Status: DC

## 2018-07-09 NOTE — Telephone Encounter (Signed)
Refilled Triamterene -HCTZ 37.5-25 mg, take 1/2 tablet by mouth daily to [facility] per fax request

## 2018-07-11 ENCOUNTER — Ambulatory Visit: Payer: Managed Care, Other (non HMO)

## 2018-08-16 ENCOUNTER — Other Ambulatory Visit: Payer: Self-pay | Admitting: *Deleted

## 2018-08-16 DIAGNOSIS — R002 Palpitations: Secondary | ICD-10-CM

## 2018-08-16 MED ORDER — METOPROLOL SUCCINATE ER 25 MG PO TB24: 75.0000 mg | ORAL_TABLET | Freq: Every day | ORAL | 0 refills | Status: DC

## 2018-08-28 ENCOUNTER — Other Ambulatory Visit: Payer: Self-pay

## 2018-08-28 ENCOUNTER — Ambulatory Visit
Admission: RE | Admit: 2018-08-28 | Discharge: 2018-08-28 | Disposition: A | Discharge status: Home / Self Care | Payer: Managed Care, Other (non HMO) | Source: Ambulatory Visit | Attending: Obstetrics | Admitting: Obstetrics

## 2018-08-28 DIAGNOSIS — Z1239 Encounter for other screening for malignant neoplasm of breast: Secondary | ICD-10-CM

## 2018-08-29 ENCOUNTER — Other Ambulatory Visit: Payer: Self-pay | Admitting: Obstetrics

## 2018-08-29 DIAGNOSIS — R921 Mammographic calcification found on diagnostic imaging of breast: Secondary | ICD-10-CM

## 2018-09-05 ENCOUNTER — Ambulatory Visit
Admission: RE | Admit: 2018-09-05 | Discharge: 2018-09-05 | Disposition: A | Discharge status: Home / Self Care | Payer: Managed Care, Other (non HMO) | Source: Ambulatory Visit | Attending: Obstetrics | Admitting: Obstetrics

## 2018-09-05 ENCOUNTER — Other Ambulatory Visit: Payer: Self-pay

## 2018-09-05 DIAGNOSIS — R921 Mammographic calcification found on diagnostic imaging of breast: Secondary | ICD-10-CM

## 2019-06-08 ENCOUNTER — Ambulatory Visit
Admission: EM | Admit: 2019-06-08 | Discharge: 2019-06-08 | Disposition: A | Discharge status: Home / Self Care | Payer: Managed Care, Other (non HMO)

## 2019-06-08 ENCOUNTER — Other Ambulatory Visit: Payer: Self-pay

## 2019-06-08 ENCOUNTER — Encounter: Payer: Self-pay | Admitting: Emergency Medicine

## 2019-06-08 DIAGNOSIS — L03112 Cellulitis of left axilla: Secondary | ICD-10-CM

## 2019-06-08 MED ORDER — CEPHALEXIN 250 MG PO CAPS: 250.0000 mg | ORAL_CAPSULE | Freq: Two times a day (BID) | ORAL | 0 refills | Status: AC

## 2019-06-08 NOTE — Discharge Instructions (Addendum)
Diagnosed as a mild skin infection, though most likely just inflammation from the area. Will cover you with 5 days of antibiotic due to soreness and irritation. Continue to keep clean with antibacterial soap and water and keep dry. If worsens then please f/u.

## 2019-06-08 NOTE — ED Provider Notes (Signed)
[facility]    CSN: [id number] Arrival date & time: 06/08/19  0940      History   Chief Complaint Chief Complaint  Patient presents with  . Abscess    HPI RICKITA EARP is a 44 y.o. female.   Who carries a history of hidradenitis. She presents with a "cyst" in her left axilla x 2-3 days. It is "sore" but different from her usual rash with hidradenitis. It did drain blood this am, but is still tender. She wanted to get it checked out due to the pain and irritation. No fever or chills. No pus was reported.      Past Medical History:  Diagnosis Date  . Allergy   . Auditory processing disorder   . Binge eating   . Depression   . Endometriosis    a. 2017 s/p endometrial ablation.  . Essential hypertension   . GERD (gastroesophageal reflux disease)   . History of chicken pox   . History of frequent urinary tract infections   . History of kidney stones   . History of seizure disorder    as a child. No medication since age 14.  Marland Kitchen Hyperlipidemia   . Inappropriate sinus tachycardia    a. previously on beta blocker- d/c'd @ time of pregnancy in 2015; b. 09/2011 Echo: EF 60-65%; c. 10/2011 ETT: ex time 52mins, stopped 2/2 fatigue, inadequate HR (beta blocker), no ischemia;  d. 01/2016 Event Monitor - sinus rhythm, pvc's;  e. 02/2016 Echo: 02/2016 EF 55-60%, Gr2 DD.  Marland Kitchen Liver hemangioma    at least 2  . Migraines   . PCOS (polycystic ovarian syndrome)   . PONV (postoperative nausea and vomiting)   . Seizures ([place])    last one age 7, continues to have abnormal EEG's and MRI's    Patient Active Problem List   Diagnosis Date Noted  . Breech presentation 02/07/2014  . [redacted] weeks gestation of pregnancy   . Elderly multigravida with antepartum condition or complication   . Trisomy 21, fetal, affecting care of mother, antepartum   . Polyhydramnios in third trimester 01/16/2014  . Palpitations 09/11/2013  . 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 Previous cesarean delivery affecting pregnancy 08/18/2013  . Unspecified high-risk pregnancy 08/14/2013  . Polycystic ovarian syndrome 01/14/2013  . 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 Pedal edema 11/14/2012  . Allergic rhinitis 11/14/2012  . Family planning 07/17/2012  . Benign essential HTN 07/17/2012  . H/O female genital system disorder 07/17/2012  . Hearing loss 05/16/2012  . Liver hemangioma 02/28/2012  . Obesity 09/20/2011  . Depression with anxiety 09/20/2011  . PTSD (post-traumatic stress disorder) 09/20/2011  . Seizure disorder ([place]) 09/20/2011  . Hyperlipidemia 09/20/2011  . Migraines 09/20/2011  . Frequent UTI 09/20/2011    Past Surgical History:  Procedure Laterality Date  . CESAREAN SECTION  2002  . CESAREAN SECTION N/A 02/07/2014   Procedure: CESAREAN SECTION;  Surgeon: Shelly Bombard, MD;  Location: [facility];  Service: Obstetrics;  Laterality: N/A;  . CHOLECYSTECTOMY  2006  . DILITATION & CURRETTAGE/HYSTROSCOPY WITH HYDROTHERMAL ABLATION N/A 07/03/2015   Procedure: DILATATION & CURETTAGE/HYSTEROSCOPY WITH HYDROTHERMAL ABLATION;  Surgeon: Shelly Bombard, MD;  Location: [facility];  Service: Gynecology;  Laterality: N/A;  . Laporoscopy    . PILONIDAL CYST EXCISION  2003  . WISDOM TOOTH EXTRACTION Bilateral     OB History    Gravida  2  Para  2   Term  1   Preterm  1   AB  0   Living  2     SAB  0   TAB  0   Ectopic  0   Multiple  0   Live Births  2            Home Medications    Prior to Admission medications   Medication Sig Start Date End Date Taking? Authorizing Provider  cholestyramine (QUESTRAN) 4 g packet Take 1 packet (4 g total) by mouth 3 (three) times daily with meals. 07/03/17   Levin Erp, PA  dicyclomine (BENTYL) 10 MG capsule TAKE 1 CAPSULE (10 MG TOTAL) BY MOUTH 3 (THREE) TIMES DAILY BEFORE MEALS. 08/16/17   Pyrtle, Lajuan Lines, MD  dicyclomine  (BENTYL) 20 MG tablet Take 1 tablet (20 mg total) by mouth 4 (four) times daily -  before meals and at bedtime. 07/03/17   Levin Erp, PA  metoprolol succinate (TOPROL-XL) 25 MG 24 hr tablet Take 3 tablets (75 mg total) by mouth daily. TAKE 1 TABLET BY MOUTH DAILY WITH OR IMMEDIATELY FOLLOWING A MEAL. 08/16/18   Crenshaw, Denice Bors, MD  NORETHINDRONE ACET-ETHINYL EST PO Take by mouth daily.    [provider]  norethindrone-ethinyl estradiol 1/35 (ALAYCEN 1/35) tablet TAKE 1 TABLET EVERY DAY OF CONTINUOUS ACTIVE PILLS FOR 3 MONTHS, THEN HAVE A CYCLE 06/21/18   Shelly Bombard, MD  Probiotic Product (PROBIOTIC ADVANCED PO) Take by mouth.    [provider]  rosuvastatin (CRESTOR) 20 MG tablet Take 1 tablet (20 mg total) by mouth daily. 10/31/17 01/29/18  Lelon Perla, MD  triamterene-hydrochlorothiazide (MAXZIDE-25) 37.5-25 MG tablet Take 0.5 tablets by mouth daily. 07/09/18   Lendon Colonel, NP    Family History Family History  Problem Relation Age of Onset  . Arthritis Father   . Hypertension Father   . Diabetes Father   . Heart disease Maternal Grandmother        ICD  . Cancer Maternal Grandfather        lung    Social History Social History   Tobacco Use  . Smoking status: Former Smoker    Packs/day: 0.50    Years: 20.00    Pack years: 10.00    Types: Cigarettes    Quit date: 05/05/2016    Years since quitting: 3.0  . Smokeless tobacco: Never Used  . Tobacco comment: 4 cigarettes daily  Substance Use Topics  . Alcohol use: Yes    Alcohol/week: 0.0 standard drinks    Comment: rare  . Drug use: No     Allergies   Adhesive [tape], Banana, Morphine and related, Other, Pertussis vaccines, and Tramadol   Review of Systems Review of Systems  Constitutional: Negative for fatigue and fever.  Skin: Positive for color change and rash.  Psychiatric/Behavioral: Negative.      Physical Exam Triage Vital Signs ED Triage Vitals [06/08/19  0947]  Enc Vitals Group     BP 115/77     Pulse Rate 61     Resp 18     Temp 98.1 F (36.7 C)     Temp Source Oral     SpO2 97 %     Weight      Height      Head Circumference      Peak Flow      Pain Score      Pain Loc  Pain Edu?      Excl. in [place]?    No data found.  Updated Vital Signs BP 115/77 (BP Location: Left Arm)   Pulse 61   Temp 98.1 F (36.7 C) (Oral)   Resp 18   SpO2 97%   Visual Acuity Right Eye Distance:   Left Eye Distance:   Bilateral Distance:    Right Eye Near:   Left Eye Near:    Bilateral Near:     Physical Exam Vitals and nursing note reviewed.  Constitutional:      General: She is not in acute distress.    Appearance: Normal appearance. She is not ill-appearing.  HENT:     Head: Normocephalic and atraumatic.  Lymphadenopathy:     Cervical: No cervical adenopathy.  Skin:    General: Skin is warm and dry.     Findings: Lesion present.     Comments: Small annular ecchymosis appearing flat macule. Mild erythema surrounding, without actual cyst or flunctuace  Neurological:     General: No focal deficit present.     Mental Status: She is alert.  Psychiatric:        Mood and Affect: Mood normal.        Behavior: Behavior normal.      UC Treatments / Results  Labs (all labs ordered are listed, but only abnormal results are displayed) Labs Reviewed - No data to display  EKG   Radiology No results found.  Procedures Procedures (including critical care time)  Medications Ordered in UC Medications - No data to display  Initial Impression / Assessment and Plan / UC Course  I have reviewed the triage vital signs and the nursing notes.  Pertinent labs & imaging results that were available during my care of the patient were reviewed by me and considered in my medical decision making (see chart for details).     Possible mild skin infection though most likely inflammation. No cyst noted. Continue warm compresses and  empirically cover with antibiotics. If worsens f/u Final Clinical Impressions(s) / UC Diagnoses   Final diagnoses:  None   Discharge Instructions   None    ED Prescriptions    None     PDMP not reviewed this encounter.   Bjorn Pippin, PA-C 06/08/19 1027

## 2019-06-08 NOTE — ED Triage Notes (Signed)
Pt presents to [facility] for assessment of mass that is draining blood under the left arm x 3 days.

## 2019-06-08 NOTE — ED Notes (Signed)
Patient able to ambulate independently  

## 2019-06-20 NOTE — Progress Notes (Signed)
Virtual Visit via Telephone Note   This visit type was conducted due to national recommendations for restrictions regarding the COVID-19 Pandemic (e.g. social distancing) in an effort to limit this patient's exposure and mitigate transmission in our community.  Due to her co-morbid illnesses, this patient is at least at moderate risk for complications without adequate follow up.  This format is felt to be most appropriate for this patient at this time.  The patient did not have access to video technology/had technical difficulties with video requiring transitioning to audio format only (telephone).  All issues noted in this document were discussed and addressed.  No physical exam could be performed with this format.  Please refer to the patient's chart for her  consent to telehealth for [facility].  Evaluation Performed:  Follow-up visit  This visit type was conducted due to national recommendations for restrictions regarding the COVID-19 Pandemic (e.g. social distancing).  This format is felt to be most appropriate for this patient at this time.  All issues noted in this document were discussed and addressed.  No physical exam was performed (except for noted visual exam findings with Video Visits).  Please refer to the patient's chart (MyChart message for video visits and phone note for telephone visits) for the patient's consent to telehealth for [facility]  Date:  06/21/2019   ID:  Chelsea Haley, DOB 07/31/1975, MRN [id number]  Patient Location:  [id number] [place] [id number]   Provider location:     [address] Office [phone] Fax [phone]   PCP:  Delia Chimes, MD  Cardiologist:  Kirk Ruths, MD  Electrophysiologist:  None   Chief Complaint: Follow-up  History of Present Illness:    Chelsea Haley is a 44 y.o. female who presents via audio/video conferencing for a telehealth visit  today.  Patient verified DOB and address.  The patient does not symptoms concerning for COVID-19 infection (fever, chills, cough, or new SHORTNESS OF BREATH).   She has a past medical history of palpitations and chest pain.  She also has a history of inappropriate sinus tachycardia which has been well controlled with metoprolol succinate.  A cardiac event monitor 11/17 showed sinus rhythm with PVCs.  Her echocardiogram 12/17 showed normal LV function.  An exercise treadmill 4/19 was negative for ischemia.  Her lipid panel 4/19 showed a total cholesterol of 217 with an LDL of 143.  She was last seen by Dr. Stanford Breed on 07/11/2017.  During that time she had multiple complaints about the nursing staff.  She was noted to have some dyspnea on exertion but no orthopnea or PND.  She had minimal pedal edema.  She indicated she had pain in her chin and shoulder when she would run in the cold.  She also indicated that she had palpitations that had improved somewhat with beta blocking medication.  She denied syncope.  She is seen virtually today for follow-up and states she is doing well.  She has had fewer palpitations over the past several months.  She is no longer using metoprolol and switch to nadolol.  She did however gain 100 pounds.  She states she is changing her diet and has lost 5 pounds.  She also states she has been walking more and getting in 5000 to 6000 steps daily.  She has stopped running but hopes to get back to it as she loses weight.  She is seeing psychiatrist for  her mental health concerns.  Her grandmother recently passed away.  She received the [employer] COVID-19 vaccination.  I will have her follow-up in 1 year and give her the salty 6 information sheet.  Today she denies chest pain, shortness of breath, lower extremity edema, fatigue, palpitations, melena, hematuria, hemoptysis, diaphoresis, weakness, presyncope, syncope, orthopnea, and PND.    Prior CV studies:   The following  studies were reviewed today:  EKG 06/14/2017 Sinus bradycardia with no ST or T wave deviation 51 bpm  Exercise tolerance test 06/28/2017  The patient walked for 10 minutes and 6 seconds on a Bruce protocol treadmill test. She achieved a peak heart rate of 173 which is 96% predicted maximal heart rate. There were no ST or T wave changes to suggest ischemia.  Her blood pressure response to exercise was normal.  This is interpreted as a negative stress test. She has no evidence of ischemia. There were no exercise-induced arrhythmias.  Past Medical History:  Diagnosis Date  . Allergy   . Auditory processing disorder   . Binge eating   . Depression   . Endometriosis    a. 2017 s/p endometrial ablation.  . Essential hypertension   . GERD (gastroesophageal reflux disease)   . History of chicken pox   . History of frequent urinary tract infections   . History of kidney stones   . History of seizure disorder    as a child. No medication since age 28.  Marland Kitchen Hyperlipidemia   . Inappropriate sinus tachycardia    a. previously on beta blocker- d/c'd @ time of pregnancy in 2015; b. 09/2011 Echo: EF 60-65%; c. 10/2011 ETT: ex time 80mins, stopped 2/2 fatigue, inadequate HR (beta blocker), no ischemia;  d. 01/2016 Event Monitor - sinus rhythm, pvc's;  e. 02/2016 Echo: 02/2016 EF 55-60%, Gr2 DD.  Marland Kitchen Liver hemangioma    at least 2  . Migraines   . PCOS (polycystic ovarian syndrome)   . PONV (postoperative nausea and vomiting)   . Seizures ([place])    last one age 3, continues to have abnormal EEG's and MRI's   Past Surgical History:  Procedure Laterality Date  . CESAREAN SECTION  2002  . CESAREAN SECTION N/A 02/07/2014   Procedure: CESAREAN SECTION;  Surgeon: Shelly Bombard, MD;  Location: [facility];  Service: Obstetrics;  Laterality: N/A;  . CHOLECYSTECTOMY  2006  . DILITATION & CURRETTAGE/HYSTROSCOPY WITH HYDROTHERMAL ABLATION N/A 07/03/2015   Procedure: DILATATION & CURETTAGE/HYSTEROSCOPY WITH  HYDROTHERMAL ABLATION;  Surgeon: Shelly Bombard, MD;  Location: [facility];  Service: Gynecology;  Laterality: N/A;  . Laporoscopy    . PILONIDAL CYST EXCISION  2003  . WISDOM TOOTH EXTRACTION Bilateral      Current Meds  Medication Sig  . DULoxetine (CYMBALTA) 60 MG capsule Take 30 mg by mouth daily.   Marland Kitchen gabapentin (NEURONTIN) 600 MG tablet Take 300 mg by mouth daily.   . nadolol (CORGARD) 40 MG tablet Take 40 mg by mouth daily.  . rosuvastatin (CRESTOR) 20 MG tablet Take 1 tablet (20 mg total) by mouth daily.  Marland Kitchen triamterene-hydrochlorothiazide (MAXZIDE-25) 37.5-25 MG tablet Take 0.5 tablets by mouth daily.  . [DISCONTINUED] Probiotic Product (PROBIOTIC ADVANCED PO) Take by mouth.   Current Facility-Administered Medications for the 06/21/19 encounter (Video Visit) with Deberah Pelton, NP  Medication  . 0.9 %  sodium chloride infusion     Allergies:   Adhesive [tape], Banana, Morphine and related, Other, Pertussis vaccines, and Tramadol  Social History   Tobacco Use  . Smoking status: Former Smoker    Packs/day: 0.50    Years: 20.00    Pack years: 10.00    Types: Cigarettes    Quit date: 05/05/2016    Years since quitting: 3.1  . Smokeless tobacco: Never Used  . Tobacco comment: 4 cigarettes daily  Substance Use Topics  . Alcohol use: Yes    Alcohol/week: 0.0 standard drinks    Comment: rare  . Drug use: No     Family Hx: The patient's family history includes Arthritis in her father; Cancer in her maternal grandfather; Diabetes in her father; Heart disease in her maternal grandmother; Hypertension in her father.  ROS:   Please see the history of present illness.     All other systems reviewed and are negative.   Labs/Other Tests and Data Reviewed:    Recent Labs: No results found for requested labs within last 8760 hours.   Recent Lipid Panel Lab Results  Component Value Date/Time   CHOL 133 12/26/2017 09:36 AM   TRIG 87 12/26/2017 09:36 AM   HDL 53  12/26/2017 09:36 AM   CHOLHDL 2.5 12/26/2017 09:36 AM   CHOLHDL 4.7 10/25/2011 08:56 AM   LDLCALC 63 12/26/2017 09:36 AM    Wt Readings from Last 3 Encounters:  06/21/19 236 lb (107 kg)  06/06/18 224 lb 9.6 oz (101.9 kg)  05/14/18 221 lb 11.2 oz (100.6 kg)     Exam:    Vital Signs:  Ht 5\' 1"  (1.549 m)   Wt 236 lb (107 kg)   BMI 44.59 kg/m    Well nourished, well developed female in no  acute distress.   ASSESSMENT & PLAN:    1.  Essential hypertension-BP today unable to obtain today.  Continue nadolol 40 mg daily Continue Maxide 37.5-25 daily Heart healthy low-sodium diet-salty 6 given Increase physical activity as tolerated  Atypical chest pain-has not experienced any further episodes of chest discomfort/pain.  Exercise tolerance test 06/28/2017 negative  for ischemia, low risk. Continue to monitor  Palpitations/inappropriate sinus tachycardia-she continues to have occasional brief periods of palpitations.  Feels that palpitations have been less since starting her nadolol. Continue nadolol 40 mg daily Avoid triggers caffeine, chocolate, EtOH, stress, etc. Heart healthy low-sodium diet Increase physical activity as tolerated  Hyperlipidemia-LDL 63 on 12/26/2017 which is down from 160 on 10/27/2017. Stopped  rosuvastatin 20 mg tablet daily Wished to control with diet  Following a new eating plan    COVID-19 Education: The signs and symptoms of COVID-19 were discussed with the patient and how to seek care for testing (follow up with PCP or arrange E-visit).  The importance of social distancing was discussed today.  Patient Risk:   After full review of this patients clinical status, I feel that they are at least moderate risk at this time.  Time:   Today, I have spent 15 minutes with the patient with telehealth technology discussing diet, exercise, palpitations.     Medication Adjustments/Labs and Tests Ordered: Current medicines are reviewed at length with the  patient today.  Concerns regarding medicines are outlined above.   Tests Ordered: No orders of the defined types were placed in this encounter.  Medication Changes: No orders of the defined types were placed in this encounter.   Disposition:  in 1 year(s)  Jossie Ng. [facility] [address] Office [phone] Fax [phone]

## 2019-06-21 ENCOUNTER — Telehealth (INDEPENDENT_AMBULATORY_CARE_PROVIDER_SITE_OTHER): Payer: Managed Care, Other (non HMO) | Admitting: General Practice

## 2019-06-21 ENCOUNTER — Encounter: Payer: Self-pay | Admitting: General Practice

## 2019-06-21 VITALS — Ht 61.0 in | Wt 236.0 lb

## 2019-06-21 DIAGNOSIS — R002 Palpitations: Secondary | ICD-10-CM | POA: Diagnosis not present

## 2019-06-21 DIAGNOSIS — R0789 Other chest pain: Secondary | ICD-10-CM

## 2019-06-21 DIAGNOSIS — I10 Essential (primary) hypertension: Secondary | ICD-10-CM

## 2019-06-21 DIAGNOSIS — E7800 Pure hypercholesterolemia, unspecified: Secondary | ICD-10-CM | POA: Diagnosis not present

## 2019-06-21 NOTE — Patient Instructions (Signed)
Medication Instructions:  The current medical regimen is effective;  continue present plan and medications as directed. Please refer to the Current Medication list given to you today. *If you need a refill on your cardiac medications before your next appointment, please call your pharmacy*  Special Instructions INCREASE PHYSICAL ACTIVITY AS TOLERATED-GOAL IS EITHER 30 MINUTES A DAY OR 150 MINUTES A WEEK.  PLEASE READ AND FOLLOW SALTY 6-ATTACHED  Follow-Up: Your next appointment:  12 month(s) Please call our office 2 months in advance to schedule this appointment Either In Person or Virtual with You may see Kirk Ruths, MD or one of the following Advanced Practice Providers on your designated Care Team:  Coletta Memos, [place], PA-C  Sande Rives, [place]  At [facility], you and your health needs are our priority.  As part of our continuing mission to provide you with exceptional heart care, we have created designated Provider Care Teams.  These Care Teams include your primary Cardiologist (physician) and Advanced Practice Providers (APPs -  Physician Assistants and Nurse Practitioners) who all work together to provide you with the care you need, when you need it.

## 2019-09-18 ENCOUNTER — Telehealth: Payer: Self-pay | Admitting: General Practice

## 2019-09-18 NOTE — Telephone Encounter (Signed)
Chelsea Haley was called this evening (5:16 PM) about her elevated heart rate. She states that she was frustrated with the way that her request for increasing her medication were handled.She stated she felt disrespected by how her situation was handled. She stated that she felt she had been passed around to different providers due to previous negative interactions at our practice. When asked about whether she was continuing to have an elevated heart rate she indicated that her heart rate had been fluctuating through the day. She stated that it had been elevated higher than usual today. When she contacted the office at 2:05 PM her heart rate was 90 bpm and when I talked to her this evening around 5:16 PM her heart rate was stated to be 105 bpm.  She also stated that at some point while driving during the day she had seen her pain as high as 168 and was having trouble breathing.  I instructed her that if she experienced further accelerated heart rates  she should present to the emergency department.  She stated that the emergency department would not know what to do with her accelerated heart rate. I recommended that she follow-up with her PCP Lorriane Shire because their office had prescribed the medication and we were not able to provide her with a sooner appointment. Her PCP office was contacted and they indicated that they had a appointment available on 09/19/2019 at 1: 20 in the afternoon. She stated that she did not know why she had a cardiologist if I was not going to adjust her medication.  She stated that she only needed a minor medication adjustment.  She was informed that because we were not able to have her present to the office for assessment and EKG evaluation she would receive medication adjustment and physical assessment sooner if she presented to her PCP office. I informed her that we would be willing to see her during the offices next available appointment perform an EKG, physical  assessment, and provide medication advice/adjustment however, we did not have any available openings at the time of the call.  She went on to say that during our virtual visit in April 2021 she was unaware that she would need to present to our office for further assessment if medication adjustment was needed. We discussed the importance of being able to perform physical assessment, obtain an EKG, and review her other medications. I informed her that in order to make a safe accurate decision and offer appropriate medical recommendations I would need these elements. She expressed understanding and stated that [place]. She asked if I knew any [facility] providers that I would be willing to refer her to because it would be more convenient for her to follow-up at their facility. I informed her that I did not know any [facility] providers and that our practice would be willing to see her during our next available appointment. She indicated that she would contact our office in the morning to set up an appointment. I thanked her for sharing her concerns with me about [facility] communication with her.

## 2019-09-18 NOTE — Telephone Encounter (Signed)
STAT if HR is under 50 or over 120 (normal HR is 60-100 beats per minute)  1) What is your heart rate? 90  2) Do you have a log of your heart rate readings (document readings)?  Pt says her watch tracks her HR   3) Do you have any other symptoms? No  Pt was told in the past that when she was on metoprolol she could take an extra 1/2 a metoprolol to lower her HR.  Since she is no longer taking metoprolol and is taking another medication she is not sure if she can do that to lower her HR. All the patient wants to know is if she can take an extra 1/2 a pill of the medication she is on now to lower her HR. She needs to keep her HR low and does not want to go to urgent care just to get told to take medication   Pt sent a MyChart message as well. She was not happy with the response she received from her MyChart message so she contacted the office. Pt disconnected the call

## 2019-10-22 NOTE — Progress Notes (Signed)
Cardiology Clinic Note   Patient Name: Chelsea Haley Date of Encounter: 10/23/2019  Primary Care Provider:  Keane Haley Primary Cardiologist:  Chelsea Haley  Patient Profile    Chelsea Haley 44 year old female presents to the clinic today for follow-up evaluation of her inappropriate sinus tachycardia.  Past Medical History    Past Medical History:  Diagnosis Date  . Allergy   . Auditory processing disorder   . Binge eating   . Depression   . Endometriosis    a. 2017 s/p endometrial ablation.  . Essential hypertension   . GERD (gastroesophageal reflux disease)   . History of chicken pox   . History of frequent urinary tract infections   . History of kidney stones   . History of seizure disorder    as a child. No medication since age 32.  Marland Kitchen Hyperlipidemia   . Inappropriate sinus tachycardia    a. previously on beta blocker- d/c'd @ time of pregnancy in 2015; b. 09/2011 Echo: EF 60-65%; c. 10/2011 ETT: ex time 4mins, stopped 2/2 fatigue, inadequate HR (beta blocker), no ischemia;  d. 01/2016 Event Monitor - sinus rhythm, pvc's;  e. 02/2016 Echo: 02/2016 EF 55-60%, Gr2 DD.  Marland Kitchen Liver hemangioma    at least 2  . Migraines   . PCOS (polycystic ovarian syndrome)   . PONV (postoperative nausea and vomiting)   . Seizures ([place])    last one age 24, continues to have abnormal EEG's and MRI's   Past Surgical History:  Procedure Laterality Date  . CESAREAN SECTION  2002  . CESAREAN SECTION N/A 02/07/2014   Procedure: CESAREAN SECTION;  Surgeon: Chelsea Bombard, Haley;  Location: [facility];  Service: Obstetrics;  Laterality: N/A;  . CHOLECYSTECTOMY  2006  . DILITATION & CURRETTAGE/HYSTROSCOPY WITH HYDROTHERMAL ABLATION N/A 07/03/2015   Procedure: DILATATION & CURETTAGE/HYSTEROSCOPY WITH HYDROTHERMAL ABLATION;  Surgeon: Chelsea Bombard, Haley;  Location: [facility];  Service: Gynecology;  Laterality: N/A;  . Laporoscopy    . PILONIDAL CYST EXCISION  2003  . WISDOM TOOTH EXTRACTION  Bilateral     Allergies  Allergies  Allergen Reactions  . Adhesive [Tape]     Skin sensitivity  . Banana     Migraine, if they are cooked or over ripe.   . Morphine And Related Itching    hallucinations  . Other Itching    WALNUTS.  Gums itch. Cashews.   . Pertussis Vaccines     seizures  . Tramadol Anxiety    History of Present Illness    Chelsea Haley has a PMH of inappropriate sinus tachycardia, atypical chest pain, essential hypertension, hyperlipidemia, migraines, liver hemangioma, seizure disorder, obesity, and PTSD.  A cardiac event monitor 11/17 showed sinus rhythm with PVCs.  Her echocardiogram 12/17 showed normal LV function.  An exercise treadmill 4/19 was negative for ischemia.  Her lipid panel 4/19 showed a total cholesterol of 217 with an LDL of 143.  She was last seen by Chelsea Haley on 07/11/2017.  During that time she had multiple complaints about the nursing staff.  She was noted to have some dyspnea on exertion but no orthopnea or PND.  She had minimal pedal edema.  She indicated she had pain in her chin and shoulder when she would run in the cold.  She also indicated that she had palpitations that had improved somewhat with beta blocking medication.  She denied syncope.  She was seen virtually 06/21/19 for follow-up and stated she was  doing well.  She  had fewer palpitations over the past several months.  She was no longer using metoprolol and switched to nadolol.  She did however gain 100 pounds.  She stated she was changing her diet and had lost 5 pounds.  She also stated she had been walking more and getting in 5000 to 6000 steps daily.  She had stopped running but hoped to get back to it as she lost weight.  She was seeing psychiatry for her mental health concerns.  Her grandmother had recently passed away.  She received the [employer] COVID-19 vaccination.  I planned her follow-up in 1 year and give her the salty 6 information sheet.  She contacted the cardiology  office on 09/18/2019 with elevated heart rate and requested a as needed increase in her medication.  Her nadolol have been prescribed by her PCP and she had not been to the clinic for EKG/assessment since 07/11/2017.  She was asked to contact the prescribing provider for recommendations.  She became upset, frustrated, and verbally insulting with office staff.  She stated she did not know why she had a cardiologist if they were not going to adjust her medications and that the cardiology clinic was not patient focused.  I contacted her via phone after clinic discuss her frustrations and reinforced that she would need to be seen and evaluated prior to cardiac medication changes that our office if not prescribed.  She presents to the clinic today for follow-up evaluation and states she feels well.  She has had no further episodes of increased heart rate that have been sustained since contacting the office on 09/18/2019.  She is meeting with Chelsea Haley weight loss to help with her current weight.  She weighs 245 pounds.  She states that she is also increasing her physical activity and has a goal of 150 minutes of physical activity per week.  She is currently exercising 45 minutes/week and using beach body on demand.  She is working with Chelsea Haley as well on her diet.  I will prescribe metoprolol tartrate 12.5 for breakthrough palpitations.  EKG today shows normal sinus rhythm low voltage QRS 60 bpm.  Parameters for taking metoprolol were reviewed.  She expresses understanding.  I will have her follow-up with Chelsea Haley in12 months.  Today she denies chest pain, shortness of breath, lower extremity edema, fatigue, palpitations, melena, hematuria, hemoptysis, diaphoresis, weakness, presyncope, syncope, orthopnea, and PND.   Home Medications    Prior to Admission medications   Medication Sig Start Date End Date Taking? Authorizing Provider  DULoxetine (CYMBALTA) 60 MG capsule Take 30 mg by mouth daily.     Provider,  Historical, Haley  gabapentin (NEURONTIN) 600 MG tablet Take 300 mg by mouth daily.     Provider, Historical, Haley  nadolol (CORGARD) 40 MG tablet Take 40 mg by mouth daily.    Provider, Historical, Haley  rosuvastatin (CRESTOR) 20 MG tablet Take 1 tablet (20 mg total) by mouth daily. 10/31/17 06/21/19  Lelon Perla, Haley  triamterene-hydrochlorothiazide (MAXZIDE-25) 37.5-25 MG tablet Take 0.5 tablets by mouth daily. 07/09/18   Lendon Colonel, NP    Family History    Family History  Problem Relation Age of Onset  . Arthritis Father   . Hypertension Father   . Diabetes Father   . Heart disease Maternal Grandmother        ICD  . Cancer Maternal Grandfather        lung   She indicated that  her mother is alive. She indicated that her father is alive. She indicated that the status of her maternal grandmother is unknown. She indicated that the status of her maternal grandfather is unknown.  Social History    Social History   Socioeconomic History  . Marital status: Married    Spouse name: Not on file  . Number of children: 1  . Years of education: Not on file  . Highest education level: Not on file  Occupational History    Employer: STUDENT    Comment: Insructor [employer]  Tobacco Use  . Smoking status: Former Smoker    Packs/day: 0.50    Years: 20.00    Pack years: 10.00    Types: Cigarettes    Quit date: 05/05/2016    Years since quitting: 3.4  . Smokeless tobacco: Never Used  . Tobacco comment: 4 cigarettes daily  Vaping Use  . Vaping Use: Never used  Substance and Sexual Activity  . Alcohol use: Yes    Alcohol/week: 0.0 standard drinks    Comment: rare  . Drug use: No  . Sexual activity: Yes    Partners: Male    Birth control/protection: OCP, Pill  Other Topics Concern  . 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 Difficulty of Paying Living Expenses:   Food Insecurity:   . Worried About Charity fundraiser in the Last Year:   . Arboriculturist in the Last Year:   Transportation Needs:   . Film/video editor (Medical):   Marland Kitchen Lack of Transportation (Non-Medical):   Physical Activity:   . Days of Exercise per Week:   . Minutes of Exercise per Session:   Stress:   . Feeling of Stress :   Social Connections:   . Frequency of Communication with Friends and Family:   . Frequency of Social Gatherings with Friends and Family:   . Attends Religious Services:   . Active Member of Clubs or Organizations:   . Attends Archivist Meetings:   Marland Kitchen Marital Status:   Intimate Partner Violence:   . Fear of Current or Ex-Partner:   . Emotionally Abused:   Marland Kitchen Physically Abused:   . Sexually Abused:      Review of Systems    General:  No chills, fever, night sweats or weight changes.  Cardiovascular:  No chest pain, dyspnea on exertion, edema, orthopnea, palpitations, paroxysmal nocturnal dyspnea. Dermatological: No rash, lesions/masses Respiratory: No cough, dyspnea Urologic: No hematuria, dysuria Abdominal:   No nausea, vomiting, diarrhea, bright red blood per rectum, melena, or hematemesis Neurologic:  No visual changes, wkns, changes in mental status. All other systems reviewed and are otherwise negative except as noted above.  Physical Exam    VS:  BP 126/82   Pulse 60   Ht 5\' 1"  (1.549 m)   Wt 245 lb (111.1 kg)   BMI 46.29 kg/m  , BMI Body mass index is 46.29 kg/m. GEN: Well nourished, well developed, in no acute distress. HEENT: normal. Neck: Supple, no JVD, carotid bruits, or masses. Cardiac: RRR, no murmurs, rubs, or gallops. No clubbing, cyanosis, edema.  Radials/DP/PT 2+ and equal bilaterally.  Respiratory:  Respirations  regular and unlabored, clear to auscultation bilaterally. GI: Soft, nontender,  nondistended, BS + x 4. MS: no deformity or atrophy. Skin: warm and dry, no rash. Neuro:  Strength and sensation are intact. Psych: Normal affect.  Accessory Clinical Findings    Recent Labs: No results found for requested labs within last 8760 hours.   Recent Lipid Panel    Component Value Date/Time   CHOL 133 12/26/2017 0936   TRIG 87 12/26/2017 0936   HDL 53 12/26/2017 0936   CHOLHDL 2.5 12/26/2017 0936   CHOLHDL 4.7 10/25/2011 0856   VLDL 31 10/25/2011 0856   LDLCALC 63 12/26/2017 0936    ECG personally reviewed by me today-normal sinus rhythm low voltage QRS 60 bpm- No acute changes  EKG 06/14/2017 Sinus bradycardia with no ST or T wave deviation 51 bpm  Exercise tolerance test 06/28/2017  The patient walked for 10 minutes and 6 seconds on a Bruce protocol treadmill test. She achieved a peak heart rate of 173 which is 96% predicted maximal heart rate. There were no ST or T wave changes to suggest ischemia.  Her blood pressure response to exercise was normal.  This is interpreted as a negative stress test. She has no evidence of ischemia. There were no exercise-induced arrhythmias.  Assessment & Plan   1. Palpitations/inappropriate sinus tachycardia-she continues to have occasional brief periods of palpitations.  Contacted office on 09/18/2019 with reports of increased palpitations and accelerated heart rate.  Continues to state that  palpitations have been less since starting her nadolol. Continue nadolol 40 mg daily May take metoprolol tartrate 12.5 mg as needed for heart rate greater than 935 BPM if systolic blood pressures greater than 110. Avoid triggers caffeine, chocolate, EtOH, stress, etc. Heart healthy low-sodium diet Increase physical activity as tolerated   Essential hypertension-BP today 126/82.   Well-controlled at home. Continue nadolol 40 mg daily Continue Maxide 37.5-25 daily Heart healthy low-sodium diet-salty 6 given Increase physical activity as  tolerated  Atypical chest pain-has not experienced any further episodes of chest discomfort/pain.  Exercise tolerance test 06/28/2017 negative  for ischemia, low risk. Continue to monitor  Hyperlipidemia-LDL 63 on 12/26/2017 which is down from 160 on 10/27/2017. Stopped  rosuvastatin 20 mg tablet daily Wished to control with diet  Following a new eating plan   Disposition: Follow-up with Chelsea Haley in 12 months or before if needed.  Jossie Ng. Jayleene Glaeser NP-C    10/23/2019, 2:14 PM [facility] [place] [address] Office [phone] Fax [phone]  Notice: This dictation was prepared with Dragon dictation along with smaller phrase technology. Any transcriptional errors that result from this process are unintentional and may not be corrected upon review.

## 2019-10-23 ENCOUNTER — Other Ambulatory Visit: Payer: Self-pay

## 2019-10-23 ENCOUNTER — Ambulatory Visit (INDEPENDENT_AMBULATORY_CARE_PROVIDER_SITE_OTHER): Payer: Managed Care, Other (non HMO) | Admitting: General Practice

## 2019-10-23 ENCOUNTER — Encounter: Payer: Self-pay | Admitting: General Practice

## 2019-10-23 VITALS — BP 126/82 | HR 60 | Ht 61.0 in | Wt 245.0 lb

## 2019-10-23 DIAGNOSIS — I10 Essential (primary) hypertension: Secondary | ICD-10-CM | POA: Diagnosis not present

## 2019-10-23 DIAGNOSIS — R0789 Other chest pain: Secondary | ICD-10-CM

## 2019-10-23 DIAGNOSIS — E7800 Pure hypercholesterolemia, unspecified: Secondary | ICD-10-CM | POA: Diagnosis not present

## 2019-10-23 DIAGNOSIS — R002 Palpitations: Secondary | ICD-10-CM | POA: Diagnosis not present

## 2019-10-23 MED ORDER — METOPROLOL TARTRATE 25 MG PO TABS: 12.5000 mg | ORAL_TABLET | Freq: Two times a day (BID) | ORAL | 0 refills | Status: DC | PRN

## 2019-10-23 NOTE — Patient Instructions (Addendum)
Medication Instructions:  Metoprolol Tartrate 12.5mg  half tablet to be used PRN- (as needed). No more than 2 doses in one day. May use for Heart rate greater than 120 beats per minute as long as SBP (Top number of blood pressure) is greater than 110.  *If you need a refill on your cardiac medications before your next appointment, please call your pharmacy*   Lab Work: None Ordered At This Time.   If you have labs (blood work) drawn today and your tests are completely normal, you will receive your results only by: Marland Kitchen MyChart Message (if you have MyChart) OR . A paper copy in the mail If you have any lab test that is abnormal or we need to change your treatment, we will call you to review the results.   Testing/Procedures: None Ordered At This Time.     Follow-Up: At [facility], you and your health needs are our priority.  As part of our continuing mission to provide you with exceptional heart care, we have created designated Provider Care Teams.  These Care Teams include your primary Cardiologist (physician) and Advanced Practice Providers (APPs -  Physician Assistants and Nurse Practitioners) who all work together to provide you with the care you need, when you need it.   Your next appointment:   12 month(s)  The format for your next appointment:   In Person  Provider:   Kirk Ruths, MD   Other Instructions Increase physical activity as tolerated, no more than a 10% increase in one week.

## 2020-01-30 ENCOUNTER — Other Ambulatory Visit: Payer: Self-pay | Admitting: Adult Health

## 2020-12-13 ENCOUNTER — Other Ambulatory Visit: Payer: Self-pay | Admitting: Cardiology

## 2021-05-10 ENCOUNTER — Other Ambulatory Visit: Payer: Self-pay | Admitting: Cardiology

## 2021-07-30 ENCOUNTER — Other Ambulatory Visit (HOSPITAL_COMMUNITY): Payer: Self-pay | Admitting: Orthopedic Surgery

## 2021-08-02 ENCOUNTER — Encounter (HOSPITAL_BASED_OUTPATIENT_CLINIC_OR_DEPARTMENT_OTHER): Payer: Self-pay | Admitting: *Deleted

## 2021-08-02 ENCOUNTER — Other Ambulatory Visit: Payer: Self-pay

## 2021-08-06 ENCOUNTER — Encounter (HOSPITAL_BASED_OUTPATIENT_CLINIC_OR_DEPARTMENT_OTHER)
Admission: RE | Admit: 2021-08-06 | Discharge: 2021-08-06 | Disposition: A | Discharge status: Home / Self Care | Payer: Managed Care, Other (non HMO) | Source: Ambulatory Visit | Attending: Orthopedic Surgery | Admitting: Orthopedic Surgery

## 2021-08-06 DIAGNOSIS — Z79899 Other long term (current) drug therapy: Secondary | ICD-10-CM | POA: Diagnosis not present

## 2021-08-06 DIAGNOSIS — Z01812 Encounter for preprocedural laboratory examination: Secondary | ICD-10-CM | POA: Diagnosis present

## 2021-08-06 LAB — BASIC METABOLIC PANEL
Anion gap: 8 (ref 5–15)
BUN: 13 mg/dL (ref 6–20)
CO2: 27 mmol/L (ref 22–32)
Calcium: 9.4 mg/dL (ref 8.9–10.3)
Chloride: 102 mmol/L (ref 98–111)
Creatinine, Ser: 0.85 mg/dL (ref 0.44–1.00)
GFR, Estimated: >60 mL/min (ref >60)
Glucose, Bld: 104 mg/dL — ABNORMAL HIGH (ref 70–99)
Potassium: 3.8 mmol/L (ref 3.5–5.1)
Sodium: 137 mmol/L (ref 135–145)

## 2021-08-06 NOTE — Progress Notes (Signed)

## 2021-08-12 ENCOUNTER — Ambulatory Visit (HOSPITAL_BASED_OUTPATIENT_CLINIC_OR_DEPARTMENT_OTHER): Payer: Managed Care, Other (non HMO)

## 2021-08-12 ENCOUNTER — Ambulatory Visit (HOSPITAL_BASED_OUTPATIENT_CLINIC_OR_DEPARTMENT_OTHER)
Admission: RE | Admit: 2021-08-12 | Discharge: 2021-08-12 | Disposition: A | Discharge status: Home / Self Care | Payer: Managed Care, Other (non HMO) | Attending: Orthopedic Surgery | Admitting: Orthopedic Surgery

## 2021-08-12 ENCOUNTER — Encounter (HOSPITAL_BASED_OUTPATIENT_CLINIC_OR_DEPARTMENT_OTHER)
Admission: RE | Disposition: A | Discharge status: Home / Self Care | Payer: Self-pay | Source: Home / Self Care | Attending: Orthopedic Surgery

## 2021-08-12 ENCOUNTER — Ambulatory Visit (HOSPITAL_BASED_OUTPATIENT_CLINIC_OR_DEPARTMENT_OTHER): Payer: Managed Care, Other (non HMO) | Admitting: Certified Registered"

## 2021-08-12 ENCOUNTER — Other Ambulatory Visit: Payer: Self-pay

## 2021-08-12 ENCOUNTER — Encounter (HOSPITAL_BASED_OUTPATIENT_CLINIC_OR_DEPARTMENT_OTHER): Payer: Self-pay | Admitting: Orthopedic Surgery

## 2021-08-12 DIAGNOSIS — Z9989 Dependence on other enabling machines and devices: Secondary | ICD-10-CM

## 2021-08-12 DIAGNOSIS — G4733 Obstructive sleep apnea (adult) (pediatric): Secondary | ICD-10-CM

## 2021-08-12 DIAGNOSIS — G5761 Lesion of plantar nerve, right lower limb: Secondary | ICD-10-CM | POA: Diagnosis present

## 2021-08-12 DIAGNOSIS — G5762 Lesion of plantar nerve, left lower limb: Secondary | ICD-10-CM

## 2021-08-12 DIAGNOSIS — Z6841 Body Mass Index (BMI) 40.0 and over, adult: Secondary | ICD-10-CM | POA: Insufficient documentation

## 2021-08-12 DIAGNOSIS — F32A Depression, unspecified: Secondary | ICD-10-CM | POA: Insufficient documentation

## 2021-08-12 DIAGNOSIS — G4730 Sleep apnea, unspecified: Secondary | ICD-10-CM | POA: Diagnosis not present

## 2021-08-12 DIAGNOSIS — I10 Essential (primary) hypertension: Secondary | ICD-10-CM | POA: Insufficient documentation

## 2021-08-12 DIAGNOSIS — F419 Anxiety disorder, unspecified: Secondary | ICD-10-CM | POA: Diagnosis not present

## 2021-08-12 DIAGNOSIS — E282 Polycystic ovarian syndrome: Secondary | ICD-10-CM | POA: Insufficient documentation

## 2021-08-12 DIAGNOSIS — Z01818 Encounter for other preprocedural examination: Secondary | ICD-10-CM

## 2021-08-12 DIAGNOSIS — K219 Gastro-esophageal reflux disease without esophagitis: Secondary | ICD-10-CM | POA: Insufficient documentation

## 2021-08-12 DIAGNOSIS — Z87891 Personal history of nicotine dependence: Secondary | ICD-10-CM

## 2021-08-12 DIAGNOSIS — Z79899 Other long term (current) drug therapy: Secondary | ICD-10-CM | POA: Insufficient documentation

## 2021-08-12 HISTORY — PX: EXCISION MORTON'S NEUROMA: SHX5013

## 2021-08-12 HISTORY — PX: WEIL OSTEOTOMY: SHX5044

## 2021-08-12 HISTORY — DX: Sleep apnea, unspecified: G47.30

## 2021-08-12 LAB — POCT PREGNANCY, URINE: Preg Test, Ur: NEGATIVE

## 2021-08-12 SURGERY — OSTEOTOMY, WEIL
Anesthesia: General | Laterality: Left

## 2021-08-12 MED ORDER — OXYCODONE HCL 5 MG PO TABS: 5.0000 mg | ORAL_TABLET | Freq: Once | ORAL | Status: DC | PRN

## 2021-08-12 MED ORDER — MIDAZOLAM HCL 5 MG/5ML IJ SOLN
INTRAMUSCULAR | Status: DC | PRN
  Administered 2021-08-12: 2 mg via INTRAVENOUS

## 2021-08-12 MED ORDER — SODIUM CHLORIDE 0.9 % IV SOLN: INTRAVENOUS | Status: DC

## 2021-08-12 MED ORDER — LIDOCAINE HCL (CARDIAC) PF 100 MG/5ML IV SOSY
PREFILLED_SYRINGE | INTRAVENOUS | Status: DC | PRN
  Administered 2021-08-12: 30 mg via INTRAVENOUS

## 2021-08-12 MED ORDER — CEFAZOLIN SODIUM-DEXTROSE 2-4 GM/100ML-% IV SOLN
2.0000 g | INTRAVENOUS | Status: AC
  Administered 2021-08-12: 2 g via INTRAVENOUS

## 2021-08-12 MED ORDER — BUPIVACAINE-EPINEPHRINE 0.5% -1:200000 IJ SOLN
INTRAMUSCULAR | Status: DC | PRN
  Administered 2021-08-12: 7 mL

## 2021-08-12 MED ORDER — FENTANYL CITRATE (PF) 100 MCG/2ML IJ SOLN
25.0000 ug | INTRAMUSCULAR | Status: DC | PRN
  Administered 2021-08-12: 50 ug via INTRAVENOUS

## 2021-08-12 MED ORDER — LACTATED RINGERS IV SOLN: INTRAVENOUS | Status: DC

## 2021-08-12 MED ORDER — ACETAMINOPHEN 500 MG PO TABS
1000.0000 mg | ORAL_TABLET | Freq: Once | ORAL | Status: AC
  Administered 2021-08-12: 1000 mg via ORAL

## 2021-08-12 MED ORDER — PROPOFOL 500 MG/50ML IV EMUL
INTRAVENOUS | Status: AC
  Filled 2021-08-12: qty 100

## 2021-08-12 MED ORDER — FENTANYL CITRATE (PF) 100 MCG/2ML IJ SOLN
INTRAMUSCULAR | Status: AC
  Filled 2021-08-12: qty 2

## 2021-08-12 MED ORDER — FENTANYL CITRATE (PF) 100 MCG/2ML IJ SOLN
INTRAMUSCULAR | Status: DC | PRN
  Administered 2021-08-12: 100 ug via INTRAVENOUS

## 2021-08-12 MED ORDER — BUPIVACAINE-EPINEPHRINE (PF) 0.5% -1:200000 IJ SOLN
INTRAMUSCULAR | Status: AC
  Filled 2021-08-12: qty 30

## 2021-08-12 MED ORDER — AMISULPRIDE (ANTIEMETIC) 5 MG/2ML IV SOLN: 10.0000 mg | Freq: Once | INTRAVENOUS | Status: DC | PRN

## 2021-08-12 MED ORDER — DEXMEDETOMIDINE (PRECEDEX) IN NS 20 MCG/5ML (4 MCG/ML) IV SYRINGE
PREFILLED_SYRINGE | INTRAVENOUS | Status: AC
  Filled 2021-08-12: qty 15

## 2021-08-12 MED ORDER — OXYCODONE HCL 5 MG/5ML PO SOLN: 5.0000 mg | Freq: Once | ORAL | Status: DC | PRN

## 2021-08-12 MED ORDER — CEFAZOLIN SODIUM-DEXTROSE 2-4 GM/100ML-% IV SOLN
INTRAVENOUS | Status: AC
  Filled 2021-08-12: qty 100

## 2021-08-12 MED ORDER — PROPOFOL 10 MG/ML IV BOLUS
INTRAVENOUS | Status: DC | PRN
  Administered 2021-08-12: 200 mg via INTRAVENOUS

## 2021-08-12 MED ORDER — KETOROLAC TROMETHAMINE 30 MG/ML IJ SOLN: 30.0000 mg | Freq: Once | INTRAMUSCULAR | Status: DC | PRN

## 2021-08-12 MED ORDER — SENNA 8.6 MG PO TABS: 2.0000 | ORAL_TABLET | Freq: Two times a day (BID) | ORAL | 0 refills | Status: AC

## 2021-08-12 MED ORDER — LIDOCAINE 2% (20 MG/ML) 5 ML SYRINGE
INTRAMUSCULAR | Status: DC | PRN
  Administered 2021-08-12: 4 mg via INTRAVENOUS

## 2021-08-12 MED ORDER — MIDAZOLAM HCL 2 MG/2ML IJ SOLN
INTRAMUSCULAR | Status: AC
  Filled 2021-08-12: qty 2

## 2021-08-12 MED ORDER — BUPIVACAINE HCL (PF) 0.25 % IJ SOLN
INTRAMUSCULAR | Status: AC
  Filled 2021-08-12: qty 30

## 2021-08-12 MED ORDER — OXYCODONE HCL 5 MG PO TABS: 5.0000 mg | ORAL_TABLET | Freq: Four times a day (QID) | ORAL | 0 refills | Status: AC | PRN

## 2021-08-12 MED ORDER — VANCOMYCIN HCL 500 MG IV SOLR
INTRAVENOUS | Status: DC | PRN
  Administered 2021-08-12: 500 mg

## 2021-08-12 MED ORDER — DOCUSATE SODIUM 100 MG PO CAPS: 100.0000 mg | ORAL_CAPSULE | Freq: Two times a day (BID) | ORAL | 0 refills | Status: AC

## 2021-08-12 MED ORDER — ACETAMINOPHEN 500 MG PO TABS
ORAL_TABLET | ORAL | Status: AC
  Filled 2021-08-12: qty 2

## 2021-08-12 MED ORDER — ONDANSETRON HCL 4 MG/2ML IJ SOLN
INTRAMUSCULAR | Status: DC | PRN
  Administered 2021-08-12: 4 mg via INTRAVENOUS

## 2021-08-12 SURGICAL SUPPLY — 66 items
APL PRP STRL LF DISP 70% ISPRP (MISCELLANEOUS) ×1
BANDAGE ESMARK 6X9 LF (GAUZE/BANDAGES/DRESSINGS) IMPLANT
BLADE AVERAGE 25X9 (BLADE) IMPLANT
BLADE LONG MED 25X9 (BLADE) ×2 IMPLANT
BLADE SURG 15 STRL LF DISP TIS (BLADE) ×2 IMPLANT
BLADE SURG 15 STRL SS (BLADE) ×4
BNDG CMPR 9X4 STRL LF SNTH (GAUZE/BANDAGES/DRESSINGS)
BNDG CMPR 9X6 STRL LF SNTH (GAUZE/BANDAGES/DRESSINGS)
BNDG COHESIVE 4X5 TAN ST LF (GAUZE/BANDAGES/DRESSINGS) ×2 IMPLANT
BNDG CONFORM 2 STRL LF (GAUZE/BANDAGES/DRESSINGS) ×2 IMPLANT
BNDG CONFORM 3 STRL LF (GAUZE/BANDAGES/DRESSINGS) ×2 IMPLANT
BNDG ESMARK 4X9 LF (GAUZE/BANDAGES/DRESSINGS) IMPLANT
BNDG ESMARK 6X9 LF (GAUZE/BANDAGES/DRESSINGS)
CHLORAPREP W/TINT 26 (MISCELLANEOUS) ×2 IMPLANT
CORD BIPOLAR FORCEPS 12FT (ELECTRODE) ×2 IMPLANT
COVER BACK TABLE 60X90IN (DRAPES) ×2 IMPLANT
CUFF TOURN SGL QUICK 18X4 (TOURNIQUET CUFF) IMPLANT
CUFF TOURN SGL QUICK 24 (TOURNIQUET CUFF) ×2
CUFF TOURN SGL QUICK 34 (TOURNIQUET CUFF)
CUFF TRNQT CYL 24X4X16.5-23 (TOURNIQUET CUFF) IMPLANT
CUFF TRNQT CYL 34X4.125X (TOURNIQUET CUFF) IMPLANT
DRAPE EXTREMITY T 121X128X90 (DISPOSABLE) ×2 IMPLANT
DRAPE OEC MINIVIEW 54X84 (DRAPES) ×2 IMPLANT
DRAPE U-SHAPE 47X51 STRL (DRAPES) ×2 IMPLANT
DRIVER FINISHING TWIST-OFF AO (ORTHOPEDIC DISPOSABLE SUPPLIES) ×1 IMPLANT
DRSG MEPITEL 4X7.2 (GAUZE/BANDAGES/DRESSINGS) ×2 IMPLANT
DRSG PAD ABDOMINAL 8X10 ST (GAUZE/BANDAGES/DRESSINGS) ×2 IMPLANT
ELECT REM PT RETURN 9FT ADLT (ELECTROSURGICAL) ×2
ELECTRODE REM PT RTRN 9FT ADLT (ELECTROSURGICAL) ×1 IMPLANT
GAUZE SPONGE 4X4 12PLY STRL (GAUZE/BANDAGES/DRESSINGS) ×2 IMPLANT
GLOVE BIO SURGEON STRL SZ8 (GLOVE) ×2 IMPLANT
GLOVE BIOGEL PI IND STRL 8 (GLOVE) ×2 IMPLANT
GLOVE BIOGEL PI INDICATOR 8 (GLOVE) ×2
GLOVE ECLIPSE 8.0 STRL XLNG CF (GLOVE) ×2 IMPLANT
GOWN STRL REUS W/ TWL LRG LVL3 (GOWN DISPOSABLE) ×1 IMPLANT
GOWN STRL REUS W/ TWL XL LVL3 (GOWN DISPOSABLE) ×2 IMPLANT
GOWN STRL REUS W/TWL LRG LVL3 (GOWN DISPOSABLE) ×2
GOWN STRL REUS W/TWL XL LVL3 (GOWN DISPOSABLE) ×4
NDL HYPO 25X1 1.5 SAFETY (NEEDLE) IMPLANT
NEEDLE HYPO 22GX1.5 SAFETY (NEEDLE) IMPLANT
NEEDLE HYPO 25X1 1.5 SAFETY (NEEDLE) IMPLANT
NS IRRIG 1000ML POUR BTL (IV SOLUTION) ×2 IMPLANT
PACK BASIN DAY SURGERY FS (CUSTOM PROCEDURE TRAY) ×2 IMPLANT
PAD CAST 4YDX4 CTTN HI CHSV (CAST SUPPLIES) ×1 IMPLANT
PADDING CAST COTTON 4X4 STRL (CAST SUPPLIES) ×2
PENCIL SMOKE EVACUATOR (MISCELLANEOUS) ×2 IMPLANT
SANITIZER HAND PURELL 535ML FO (MISCELLANEOUS) ×2 IMPLANT
SCREW HCS TWIST-OFF 2.0X12MM (Screw) ×1 IMPLANT
SHEET MEDIUM DRAPE 40X70 STRL (DRAPES) ×2 IMPLANT
SLEEVE SCD COMPRESS KNEE MED (STOCKING) ×2 IMPLANT
SPONGE T-LAP 18X18 ~~LOC~~+RFID (SPONGE) ×2 IMPLANT
STOCKINETTE 6  STRL (DRAPES) ×2
STOCKINETTE 6 STRL (DRAPES) ×1 IMPLANT
SUCTION FRAZIER HANDLE 10FR (MISCELLANEOUS) ×2
SUCTION TUBE FRAZIER 10FR DISP (MISCELLANEOUS) ×1 IMPLANT
SUT ETHILON 3 0 PS 1 (SUTURE) ×2 IMPLANT
SUT MNCRL AB 3-0 PS2 18 (SUTURE) ×2 IMPLANT
SUT VIC AB 2-0 SH 27 (SUTURE)
SUT VIC AB 2-0 SH 27XBRD (SUTURE) IMPLANT
SUT VICRYL 0 UR6 27IN ABS (SUTURE) IMPLANT
SYR BULB EAR ULCER 3OZ GRN STR (SYRINGE) ×2 IMPLANT
SYR CONTROL 10ML LL (SYRINGE) IMPLANT
TOWEL GREEN STERILE FF (TOWEL DISPOSABLE) ×2 IMPLANT
TUBE CONNECTING 20X1/4 (TUBING) IMPLANT
UNDERPAD 30X36 HEAVY ABSORB (UNDERPADS AND DIAPERS) ×2 IMPLANT
YANKAUER SUCT BULB TIP NO VENT (SUCTIONS) IMPLANT

## 2021-08-12 NOTE — Discharge Instructions (Addendum)
Chelsea Simmer, MD [facility]  Please read the following information regarding your care after surgery.  Medications  You only need a prescription for the narcotic pain medicine (ex. oxycodone, Percocet, Norco).  All of the other medicines listed below are available over the counter. ? Aleve 2 pills twice a day for the first 3 days after surgery. ? acetominophen (Tylenol) 650 mg every 4-6 hours as you need for minor to moderate pain ? oxycodone as prescribed for severe pain  Narcotic pain medicine (ex. oxycodone, Percocet, Vicodin) will cause constipation.  To prevent this problem, take the following medicines while you are taking any pain medicine. ? docusate sodium (Colace) 100 mg twice a day ? senna (Senokot) 2 tablets twice a day  Weight Bearing ? Bear weight only on your operated foot in the post-op shoe.   Cast / Splint / Dressing ? Keep your splint, cast or dressing clean and dry.  Don't put anything (coat hanger, pencil, etc) down inside of it.  If it gets damp, use a hair dryer on the cool setting to dry it.  If it gets soaked, call the office to schedule an appointment for a cast change.   After your dressing, cast or splint is removed; you may shower, but do not soak or scrub the wound.  Allow the water to run over it, and then gently pat it dry.  Swelling It is normal for you to have swelling where you had surgery.  To reduce swelling and pain, keep your toes above your nose for at least 3 days after surgery.  It may be necessary to keep your foot or leg elevated for several weeks.  If it hurts, it should be elevated.  Follow Up Call my office at [phone] when you are discharged from the hospital or surgery center to schedule an appointment to be seen two weeks after surgery.  Call my office at [phone] if you develop a fever >101.5 F, nausea, vomiting, bleeding from the surgical site or severe pain.      May take Tylenol after 2:15pm, if needed.    Post  Anesthesia Home Care Instructions  Activity: Get plenty of rest for the remainder of the day. A responsible individual must stay with you for 24 hours following the procedure.  For the next 24 hours, DO NOT: -Drive a car -Paediatric nurse -Drink alcoholic beverages -Take any medication unless instructed by your physician -Make any legal decisions or sign important papers.  Meals: Start with liquid foods such as gelatin or soup. Progress to regular foods as tolerated. Avoid greasy, spicy, heavy foods. If nausea and/or vomiting occur, drink only clear liquids until the nausea and/or vomiting subsides. Call your physician if vomiting continues.  Special Instructions/Symptoms: Your throat may feel dry or sore from the anesthesia or the breathing tube placed in your throat during surgery. If this causes discomfort, gargle with warm salt water. The discomfort should disappear within 24 hours.  If you had a scopolamine patch placed behind your ear for the management of post- operative nausea and/or vomiting:  1. The medication in the patch is effective for 72 hours, after which it should be removed.  Wrap patch in a tissue and discard in the trash. Wash hands thoroughly with soap and water. 2. You may remove the patch earlier than 72 hours if you experience unpleasant side effects which may include dry mouth, dizziness or visual disturbances. 3. Avoid touching the patch. Wash your hands with soap and water after contact  with the patch.

## 2021-08-12 NOTE — H&P (Signed)
Chelsea Haley is an 46 y.o. female.   Chief Complaint: Left foot pain HPI: 46 year old female complains of left forefoot pain worsening over the last several months.  She has a history of Morton's neuroma and also has signs and symptoms of metatarsalgia at the third metatarsal head.  She has failed nonoperative treatment including activity modification, oral anti-inflammatories and shoewear modification.  She presents today for surgical correction of these painful left forefoot deformities.  Past Medical History:  Diagnosis Date   Allergy    Auditory processing disorder    Binge eating    Depression    Endometriosis    a. 2017 s/p endometrial ablation.   Essential hypertension    GERD (gastroesophageal reflux disease)    History of chicken pox    History of frequent urinary tract infections    History of kidney stones    History of seizure disorder    as a child. No medication since age 89.   Hyperlipidemia    Inappropriate sinus tachycardia    a. previously on beta blocker- d/c'd @ time of pregnancy in 2015; b. 09/2011 Echo: EF 60-65%; c. 10/2011 ETT: ex time 7mns, stopped 2/2 fatigue, inadequate HR (beta blocker), no ischemia;  d. 01/2016 Event Monitor - sinus rhythm, pvc's;  e. 02/2016 Echo: 02/2016 EF 55-60%, Gr2 DD.   Liver hemangioma    at least 2   Migraines    PCOS (polycystic ovarian syndrome)    PONV (postoperative nausea and vomiting)    [place]    last one age 46 continues to have abnormal EEG's and MRI's   Sleep apnea     Past Surgical History:  Procedure Laterality Date   CESAREAN SECTION  2002   CESAREAN SECTION N/A 02/07/2014   Procedure: CESAREAN SECTION;  Surgeon: CShelly Bombard MD;  [facility];  Service: Obstetrics;  Laterality: N/A;   CHOLECYSTECTOMY  2006   DILITATION & CURRETTAGE/HYSTROSCOPY WITH HYDROTHERMAL ABLATION N/A 07/03/2015   Procedure: DILATATION & CURETTAGE/HYSTEROSCOPY WITH HYDROTHERMAL ABLATION;  Surgeon: CShelly Bombard MD;   [facility];  Service: Gynecology;  Laterality: N/A;   Laporoscopy     PILONIDAL CYST EXCISION  2003   WISDOM TOOTH EXTRACTION Bilateral     Family History  Problem Relation Age of Onset   Arthritis Father    Hypertension Father    Diabetes Father    Heart disease Maternal Grandmother        ICD   Cancer Maternal Grandfather        lung   Social History:  reports that she quit smoking about 5 years ago. Her smoking use included cigarettes. She has a 10.00 pack-year smoking history. She has never used smokeless tobacco. She reports current alcohol use. She reports that she does not use drugs.  Allergies:  Allergies  Allergen Reactions   Adhesive [Tape]     Skin sensitivity   Banana     Migraine, if they are cooked or over ripe.    Morphine And Related Itching    hallucinations   Other Itching    WALNUTS.  Gums itch. Cashews.    Pertussis Vaccines     seizures   Tramadol Anxiety    Facility-Administered Medications Prior to Admission  Medication Dose Route Frequency Provider Last Rate Last Admin   0.9 %  sodium chloride infusion  500 mL Intravenous Once Pyrtle, JLajuan Lines MD       Medications Prior to Admission  Medication Sig Dispense Refill  atorvastatin (LIPITOR) 10 MG tablet Take 10 mg by mouth daily.     DULoxetine (CYMBALTA) 60 MG capsule Take 30 mg by mouth daily.      gabapentin (NEURONTIN) 600 MG tablet Take 300 mg by mouth daily.      lisdexamfetamine (VYVANSE) 30 MG capsule Take 30 mg by mouth daily.     Multiple Vitamins-Minerals (WOMENS ONE DAILY PO) Take by mouth.     nadolol (CORGARD) 40 MG tablet Take 40 mg by mouth daily.     omeprazole (PRILOSEC OTC) 20 MG tablet Take 20 mg by mouth daily.     Phenylephrine-DM-GG-APAP (SUDAFED PE PRESSURE+PAIN+COLD PO) Take by mouth.     rifaximin (XIFAXAN) 550 MG TABS tablet Take 550 mg by mouth.     triamterene-hydrochlorothiazide (MAXZIDE-25) 37.5-25 MG tablet TAKE 1/2 TABLET BY MOUTH EVERY DAY 45 tablet 0     Results for orders placed or performed during the hospital encounter of 2021-08-20 (from the past 48 hour(s))  Pregnancy, urine POC     Status: None   Collection Time: 08/20/21  7:53 AM  Result Value Ref Range   Preg Test, Ur NEGATIVE NEGATIVE    Comment:        THE SENSITIVITY OF THIS METHODOLOGY IS >24 mIU/mL    No results found.  Review of Systems no recent fever, chills, nausea, vomiting or changes in her appetite  Blood pressure 120/72, pulse 66, temperature 98.1 F (36.7 C), temperature source Oral, resp. rate 18, height '5\' 1"'$  (1.549 m), weight 114.2 kg, SpO2 100 %. Physical Exam  Well-nourished well-developed woman in no apparent distress.  Alert and oriented x4.  Normal mood and affect.  Gait is normal.  Tender to palpation of the third webspace of the left foot.  Pulses are palpable in the foot.  Intact sensibility to light touch dorsally and plantarly at the forefoot.  5 out of 5 strength in plantarflexion and dorsiflexion of the toes.   Assessment/Plan Left forefoot metatarsalgia and third webspace Morton's neuroma -to the operating room today for third webspace Morton's neuroma excision and third metatarsal Weil osteotomy.  The risks and benefits of the alternative treatment options have been discussed in detail.  The patient wishes to proceed with surgery and specifically understands risks of bleeding, infection, nerve damage, blood clots, need for additional surgery, amputation and death.   Wylene Simmer, MD 2021/08/20, 9:44 AM

## 2021-08-12 NOTE — Anesthesia Postprocedure Evaluation (Signed)
Anesthesia Post Note  Patient: Chelsea Haley  Procedure(s) Performed: Left 3rd metatarsal Weil (Left) 3rd webspace Morton's neuroma excision (Left)     Patient location during evaluation: PACU Anesthesia Type: General Level of consciousness: awake Pain management: pain level controlled Vital Signs Assessment: post-procedure vital signs reviewed and stable Respiratory status: spontaneous breathing, nonlabored ventilation, respiratory function stable and patient connected to nasal cannula oxygen Cardiovascular status: blood pressure returned to baseline and stable Postop Assessment: no apparent nausea or vomiting Anesthetic complications: no   No notable events documented.  Last Vitals:  Vitals:   08/12/21 1130 08/12/21 1200  BP: 123/71 124/78  Pulse: 71 71  Resp: 12 17  Temp:  36.5 C  SpO2: 98% 96%    Last Pain:  Vitals:   08/12/21 1200  TempSrc:   PainSc: 0-No pain                 Celese Banner P Lakiesha Ralphs

## 2021-08-12 NOTE — Op Note (Signed)
08/12/2021  10:54 AM  PATIENT:  Chelsea Haley  46 y.o. female  PRE-OPERATIVE DIAGNOSIS:  1.  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  Left foot metatarsalgia  POST-OPERATIVE DIAGNOSIS:  same  Procedure(s):  Left 3rd metatarsal Weil osteotomy Left 3rd webspace Morton's neuroma excision  SURGEON:  Wylene Simmer, MD  ASSISTANT: Mechele Claude, PA-C  ANESTHESIA:   General, local  EBL:  minimal   TOURNIQUET:   Total Tourniquet Time Documented: Calf (Left) - 17 minutes Total: Calf (Left) - 17 minutes  COMPLICATIONS:  None apparent  DISPOSITION:  Extubated, awake and stable to recovery.  INDICATION FOR PROCEDURE: 46 year old female with a long history of left forefoot pain due to metatarsalgia and a third webspace Morton's neuroma.  She has failed nonoperative treatment to date and presents today for third metatarsal Weil and Morton's neuroma excision at the third webspace.  The risks and benefits of the alternative treatment options have been discussed in detail.  The patient wishes to proceed with surgery and specifically understands risks of bleeding, infection, nerve damage, blood clots, need for additional surgery, amputation and death.   PROCEDURE IN DETAIL:  After pre operative consent was obtained, and the correct operative site was identified, the patient was brought to the operating room and placed supine on the OR table.  Anesthesia was administered.  Pre-operative antibiotics were administered.  A surgical timeout was taken.  The left lower extremity was prepped and draped in standard sterile fashion with a tourniquet around the calf.  The extremity was elevated and the tourniquet was inflated to 200 mmHg.  A longitudinal incision was made over the third webspace.  Dissection was carried sharply down through the subcutaneous tissues.  The intermetatarsal ligament was identified.  It was divided under direct vision.  The interdigital nerve was then identified.  It was traced  proximally to its origin at the level of the interosseous muscles.  It was transected.  It was then dissected distally past the bifurcation.  It was transected medially and laterally and removed.  Attention was turned to the third MTP joint.  An incision was made medial to the extensor tendons and the dorsal joint capsule.  The head of the metatarsal was exposed.  The oscillating saw was used to create a Weil osteotomy in the metatarsal head.  The metatarsal head was allowed to retract several millimeters proximally.  The osteotomy was fixed with a 2 mm [employer] FRS screw.  Overhanging bone was trimmed with a rondure.  AP, lateral and oblique radiographs of the left foot show appropriate shortening of the third metatarsal and appropriate position and length of the screw.  Local anesthetic was infiltrated into the wound proximally, medially and laterally.  The wound was irrigated copiously and sprinkled with vancomycin powder.  Subcutaneous tissues were approximated with Monocryl.  Skin incision was closed with nylon.  Sterile dressings were applied followed by a compression wrap.  The tourniquet was released after application of the dressings.  The patient was awakened from anesthesia and transported to the recovery room in stable condition.   FOLLOW UP PLAN: Weightbearing as tolerated in a flat postop shoe.  Follow-up in the office in 2 weeks for suture removal.  Plan postop shoe immobilization for 4 to 6 weeks.  No indication for DVT prophylaxis in this ambulatory patient.   RADIOGRAPHS: AP, lateral and oblique radiographs of the left foot are obtained intraoperatively.  These show interval shortening of the third  metatarsal.  Hardware is appropriately positioned and of the appropriate length.  No other acute injuries are noted.    Mechele Claude PA-C was present and scrubbed for the duration of the operative case. His assistance was essential in positioning the patient, prepping and draping,  gaining and maintaining exposure, performing the operation, closing and dressing the wounds and applying the splint.

## 2021-08-12 NOTE — Anesthesia Preprocedure Evaluation (Addendum)
Anesthesia Evaluation  Patient identified by MRN, date of birth, ID band Patient awake    Reviewed: Allergy & Precautions, NPO status , Patient's Chart, lab work & pertinent test results  History of Anesthesia Complications (+) PONV and history of anesthetic complications  Airway Mallampati: II  TM Distance: >3 FB Neck ROM: Full    Dental no notable dental hx.    Pulmonary sleep apnea and Continuous Positive Airway Pressure Ventilation , former smoker,    Pulmonary exam normal        Cardiovascular hypertension, Pt. on medications and Pt. on home beta blockers Normal cardiovascular exam     Neuro/Psych  Headaches, Seizures -, Well Controlled,  PSYCHIATRIC DISORDERS Anxiety Depression PTSD (post-traumatic stress disorder)   GI/Hepatic Neg liver ROS, GERD  Medicated and Controlled,  Endo/Other  Morbid obesityPCOS (polycystic ovarian syndrome)  Renal/GU negative Renal ROS     Musculoskeletal negative musculoskeletal ROS (+)   Abdominal (+) + obese,   Peds  Hematology negative hematology ROS (+)   Anesthesia Other Findings Left foot Morton's neuroma  Reproductive/Obstetrics hcg negative                            Anesthesia Physical Anesthesia Plan  ASA: 3  Anesthesia Plan: General   Post-op Pain Management:    Induction: Intravenous  PONV Risk Score and Plan: 4 or greater and Ondansetron, Dexamethasone, Midazolam, Treatment may vary due to age or medical condition and Propofol infusion  Airway Management Planned: LMA  Additional Equipment:   Intra-op Plan:   Post-operative Plan: Extubation in OR  Informed Consent: I have reviewed the patients History and Physical, chart, labs and discussed the procedure including the risks, benefits and alternatives for the proposed anesthesia with the patient or authorized representative who has indicated his/her understanding and acceptance.      Dental advisory given  Plan Discussed with: CRNA  Anesthesia Plan Comments:        Anesthesia Quick Evaluation

## 2021-08-12 NOTE — Anesthesia Procedure Notes (Signed)
Procedure Name: LMA Insertion Date/Time: 08/12/2021 10:26 AM Performed by: Signe Colt, CRNA Pre-anesthesia Checklist: Patient identified, Emergency Drugs available, Suction available and Patient being monitored Patient Re-evaluated:Patient Re-evaluated prior to induction Oxygen Delivery Method: Circle System Utilized Preoxygenation: Pre-oxygenation with 100% oxygen Induction Type: IV induction Ventilation: Mask ventilation without difficulty LMA: LMA inserted LMA Size: 4.0 Number of attempts: 1 Airway Equipment and Method: bite block Placement Confirmation: positive ETCO2 Tube secured with: Tape Dental Injury: Teeth and Oropharynx as per pre-operative assessment

## 2021-08-12 NOTE — Transfer of Care (Signed)
Immediate Anesthesia Transfer of Care Note  Patient: CLOIS MONTAVON  Procedure(s) Performed: Left 3rd metatarsal Weil (Left) 3rd webspace Morton's neuroma excision (Left)  Patient Location: PACU  Anesthesia Type:General  Level of Consciousness: drowsy and patient cooperative  Airway & Oxygen Therapy: Patient Spontanous Breathing and Patient connected to face mask oxygen  Post-op Assessment: Report given to RN and Post -op Vital signs reviewed and stable  Post vital signs: Reviewed and stable  Last Vitals:  Vitals Value Taken Time  BP    Temp    Pulse 68 08/12/21 1056  Resp 11 08/12/21 1056  SpO2 100 % 08/12/21 1056  Vitals shown include unvalidated device data.  Last Pain:  Vitals:   08/12/21 0803  TempSrc: Oral  PainSc: 2       Patients Stated Pain Goal: 2 (96/88/64 [id number])  Complications: No notable events documented.

## 2021-08-13 ENCOUNTER — Encounter (HOSPITAL_BASED_OUTPATIENT_CLINIC_OR_DEPARTMENT_OTHER): Payer: Self-pay | Admitting: Orthopedic Surgery

## 2021-08-16 ENCOUNTER — Encounter (HOSPITAL_BASED_OUTPATIENT_CLINIC_OR_DEPARTMENT_OTHER): Payer: Self-pay | Admitting: Orthopedic Surgery
